# Patient Record
Sex: Female | Born: 1960
Health system: Southern US, Community
[De-identification: ages and names within clinical notes are randomized; demographics above are authoritative.]

## PROBLEM LIST (undated history)

## (undated) DIAGNOSIS — E119 Type 2 diabetes mellitus without complications: Secondary | ICD-10-CM

## (undated) DIAGNOSIS — J449 Chronic obstructive pulmonary disease, unspecified: Secondary | ICD-10-CM

## (undated) DIAGNOSIS — N200 Calculus of kidney: Secondary | ICD-10-CM

## (undated) HISTORY — PX: ABDOMINAL HYSTERECTOMY: SHX81

## (undated) HISTORY — PX: BLADDER SURGERY: SHX569

---

## 1998-08-27 ENCOUNTER — Other Ambulatory Visit: Admission: RE | Admit: 1998-08-27 | Discharge: 1998-08-27 | Payer: Self-pay | Admitting: Obstetrics and Gynecology

## 1999-09-29 ENCOUNTER — Other Ambulatory Visit: Admission: RE | Admit: 1999-09-29 | Discharge: 1999-09-29 | Payer: Self-pay | Admitting: Obstetrics & Gynecology

## 2001-02-19 ENCOUNTER — Emergency Department (HOSPITAL_COMMUNITY): Admission: EM | Admit: 2001-02-19 | Discharge: 2001-02-19 | Payer: Self-pay

## 2001-05-07 ENCOUNTER — Emergency Department (HOSPITAL_COMMUNITY): Admission: EM | Admit: 2001-05-07 | Discharge: 2001-05-07 | Payer: Self-pay

## 2001-08-28 ENCOUNTER — Emergency Department (HOSPITAL_COMMUNITY): Admission: EM | Admit: 2001-08-28 | Discharge: 2001-08-28 | Payer: Self-pay | Admitting: Emergency Medicine

## 2001-08-28 ENCOUNTER — Encounter: Payer: Self-pay | Admitting: Emergency Medicine

## 2001-09-21 ENCOUNTER — Other Ambulatory Visit: Admission: RE | Admit: 2001-09-21 | Discharge: 2001-09-21 | Payer: Self-pay | Admitting: Obstetrics and Gynecology

## 2002-07-21 ENCOUNTER — Emergency Department (HOSPITAL_COMMUNITY): Admission: EM | Admit: 2002-07-21 | Discharge: 2002-07-22 | Payer: Self-pay | Admitting: Emergency Medicine

## 2002-08-30 ENCOUNTER — Other Ambulatory Visit: Admission: RE | Admit: 2002-08-30 | Discharge: 2002-08-30 | Payer: Self-pay | Admitting: Obstetrics and Gynecology

## 2002-11-01 ENCOUNTER — Ambulatory Visit (HOSPITAL_COMMUNITY): Admission: RE | Admit: 2002-11-01 | Discharge: 2002-11-01 | Payer: Self-pay | Admitting: Family Medicine

## 2002-11-01 ENCOUNTER — Encounter: Payer: Self-pay | Admitting: Family Medicine

## 2003-05-22 ENCOUNTER — Encounter (INDEPENDENT_AMBULATORY_CARE_PROVIDER_SITE_OTHER): Payer: Self-pay | Admitting: *Deleted

## 2003-05-22 ENCOUNTER — Inpatient Hospital Stay (HOSPITAL_COMMUNITY): Admission: RE | Admit: 2003-05-22 | Discharge: 2003-05-25 | Payer: Self-pay | Admitting: Obstetrics and Gynecology

## 2003-06-01 ENCOUNTER — Inpatient Hospital Stay (HOSPITAL_COMMUNITY): Admission: AD | Admit: 2003-06-01 | Discharge: 2003-06-01 | Payer: Self-pay | Admitting: Obstetrics and Gynecology

## 2003-06-03 ENCOUNTER — Inpatient Hospital Stay (HOSPITAL_COMMUNITY): Admission: AD | Admit: 2003-06-03 | Discharge: 2003-06-03 | Payer: Self-pay | Admitting: Obstetrics and Gynecology

## 2003-06-08 ENCOUNTER — Inpatient Hospital Stay (HOSPITAL_COMMUNITY): Admission: AD | Admit: 2003-06-08 | Discharge: 2003-06-08 | Payer: Self-pay | Admitting: Obstetrics & Gynecology

## 2004-04-01 ENCOUNTER — Ambulatory Visit (HOSPITAL_COMMUNITY): Admission: RE | Admit: 2004-04-01 | Discharge: 2004-04-01 | Payer: Self-pay | Admitting: Family Medicine

## 2004-04-22 ENCOUNTER — Ambulatory Visit (HOSPITAL_COMMUNITY): Admission: RE | Admit: 2004-04-22 | Discharge: 2004-04-22 | Payer: Self-pay | Admitting: Family Medicine

## 2005-03-07 ENCOUNTER — Emergency Department (HOSPITAL_COMMUNITY): Admission: EM | Admit: 2005-03-07 | Discharge: 2005-03-08 | Payer: Self-pay | Admitting: Emergency Medicine

## 2005-03-12 ENCOUNTER — Ambulatory Visit (HOSPITAL_COMMUNITY): Admission: RE | Admit: 2005-03-12 | Discharge: 2005-03-12 | Payer: Self-pay | Admitting: Family Medicine

## 2005-04-02 ENCOUNTER — Encounter: Admission: RE | Admit: 2005-04-02 | Discharge: 2005-04-02 | Payer: Self-pay | Admitting: Gastroenterology

## 2005-11-16 ENCOUNTER — Emergency Department (HOSPITAL_COMMUNITY): Admission: EM | Admit: 2005-11-16 | Discharge: 2005-11-16 | Payer: Self-pay | Admitting: Emergency Medicine

## 2010-08-12 ENCOUNTER — Emergency Department (HOSPITAL_COMMUNITY): Admission: EM | Admit: 2010-08-12 | Discharge: 2010-08-12 | Payer: Self-pay | Admitting: Internal Medicine

## 2011-04-09 NOTE — Discharge Summary (Signed)
   NAME:  Jill Barnes, Jill Barnes                        ACCOUNT NO.:  [id number]   MEDICAL RECORD NO.:  [id number]                   PATIENT TYPE:  INP   LOCATION:  9308                                 FACILITY:  [facility]   PHYSICIAN:  Malva Limes, M.D.                 DATE OF BIRTH:  [birth year]   DATE OF ADMISSION:  05/22/2003  DATE OF DISCHARGE:  05/25/2003                                 DISCHARGE SUMMARY   PRINCIPAL DISCHARGE DIAGNOSES:  1. Chronic pelvic pain.  2. Menometorrhagia.  3. Symptomatic prolapse.  4. Stress urinary incontinence.   PRINCIPAL PROCEDURES:  1. Diagnostic laparoscopy.  2. Right ovarian cystectomy.  3. Total vaginal hysterectomy.  4. Placement of transvaginal tape.  5. Cystoscopy.  6. McCall's culdoplasty.  7. Anterior and posterior colporrhaphy.   HISTORY OF PRESENT ILLNESS:  [name] is a 50 year old white female, G2,  P2 who presented to [facility] on May 22, 2003 for the above listed  procedures secondary to a several-year history of worsening symptomatology.  A complete description of the events which led to this hospitalization can  be found in the dictated history and physical.   HOSPITAL COURSE:  The patient underwent the above listed procedures on May 22, 2003 without complications.  The patient's pathology was benign.  The  patient's preoperative hemoglobin was 15.7 and postoperative 13.6.  The  patient did have a postoperative temperature elevation on day number two;  white count was checked and found to be 17.2 thousand.  It was felt that  this was likely secondary to pulmonary etiology therefore patient was  instructed in aggressive incentive spirometry and told to discontinue her  smoking.  This temperature was isolated and the patient remained afebrile  throughout the remaining hospitalization.  The patient was discharged to  home on postoperative day number three after she had a bowel movement and  was tolerating a regular  diet.   The patient was discharged to home with a catheter and instructed to follow  up in the office in six days after removal and a voiding trial.  The patient  will follow up in the office of Dr. Edward Jolly in six days to evaluate her  bladder function.  At the time of discharge the patient's condition was  stable.                                                Malva Limes, M.D.    MA/MEDQ  D:  07/01/2003  T:  07/01/2003  Job:  [id number]

## 2011-04-09 NOTE — Op Note (Signed)
NAME:  Jill Barnes, Jill Barnes                        ACCOUNT NO.:  [id number]   MEDICAL RECORD NO.:  [id number]                   PATIENT TYPE:  INP   LOCATION:  9308                                 FACILITY:  [facility]   PHYSICIAN:  Malva Limes, M.D.                 DATE OF BIRTH:  Jan 22, 1961   DATE OF PROCEDURE:  05/22/2003  DATE OF DISCHARGE:                                 OPERATIVE REPORT   PREOPERATIVE DIAGNOSIS:  1. Chronic pelvic pain.  2. Menometrorrhagia.  3. Uterine prolapse.   POSTOPERATIVE DIAGNOSIS:  1. Chronic pelvic pain.  2. Menometrorrhagia.  3. Uterine prolapse.   OPERATION PERFORMED:  1. Diagnostic laparoscopy.  2. Right ovarian cystotomy.  3. Total vaginal hysterectomy.   SURGEON:  Malva Limes, M.D.   ASSISTANT:  Randye Lobo, M.D.   ANESTHESIA:  General endotracheal.   ANTIBIOTICS:  Ancef 1g.   ESTIMATED BLOOD LOSS:  .   COMPLICATIONS:  None.   DRAINS:  Foley catheter to bedside drainage.   SPECIMENS:  Cervix and uterus sent to pathology along with Hulka clips.   DESCRIPTION OF PROCEDURE:  The patient was taken to the operating room where  general anesthetic was administered without complications.  She was then  placed in a dorsal lithotomy position.  She was prepped with Hibiclens and  draped in the usual fashion for this procedure.  Her bladder was drained  with a red rubber catheter.  Sterile speculum was placed in the vagina.  A  Hulka tenaculum was applied to the anterior cervical lip.  A vertical skin  incision was made in the umbilicus.  The fascia was grasped, incised with  the scissors.  The parietal peritoneum was entered bluntly.  0 Vicryl  sutures were placed in the fascia and the Hasson cannula placed in the  abdominal cavity.  3L of carbon dioxide was insufflated.  The patient was  placed in Trendelenburg position.  A 5 mm port was placed in the suprapubic  region under direct visualization.  On examination, the patient had  no  evidence of endometriosis or adhesions.  The patient's pain was mostly on  the right and there appeared to be a peritoneal window; however, no obvious  endometriosis was discovered.  The right ovary appeared to be perfectly  normal.  The left ovary had a 3 cm cyst which on opening it, clear serous  fluid was noted.  There were no excrescences on this ovary and the ovary  appeared to be normal otherwise.  At this point laparoscopy was discontinued  and attention was turned to the vagina.  The cervix was injected with 1%  lidocaine with epinephrine.  The posterior cul-de-sac was entered sharply.  The uterosacral ligaments were bilaterally clamped, cut and ligated with 0  Monocryl suture.  The cervix was then circumscribed.  The bladder pillars  were bilaterally clamped, cut and ligated with 0 Monocryl suture.  The  anterior cul-de-sac was entered sharply.  Cardinal ligaments were serially  clamped, cut and ligated with 0 Monocryl suture.  The uterine vessels were  bilaterally clamped, cut and ligated with 0 Monocryl suture.  Once the level  of the round ligament was reached, the uterus was inverted and the round  ligament, fallopian tubes and ovarian ligaments were bilaterally clamped,  cut and the specimen removed.  These pedicles were doubly ligated with 0  Monocryl suture.  The Hulka clip on both sides from the previous tubal  ligation was included in this pedicle and excised after the 0 Vicryl sutures  were placed.  The pedicles were all checked and felt to be absolutely  hemostatic.  Following this, the posterior cuff was run using 0 Vicryl in a  running locking fashion.  At this point Dr. Edward Jolly took over the case where  she proceeded to do anterior and posterior colporrhaphy, culdoplasty,  placement of TVT sling and cystoscopy.  This will be dictated by her.  At  the conclusion of the procedure, the abdomen was reinsufflated and pedicles  all checked and found to be hemostatic.   Instruments removed,  pneumoperitoneum released.  The fascia and the umbilicus was closed with  interrupted 0 Vicryl suture.  The skin was closed with 4-0 Vicryl suture.  The lower incision was closed with DermaBond.  The patient tolerated the  procedure well.  She was taken to the recovery room in stable condition.  Instrument and lap counts were correct times two.                                                Malva Limes, M.D.    MA/MEDQ  D:  05/22/2003  T:  05/22/2003  Job:  [id number]

## 2011-04-09 NOTE — Op Note (Signed)
NAME:  Jill Barnes, Jill Barnes                        ACCOUNT NO.:  [id number]   MEDICAL RECORD NO.:  [id number]                   PATIENT TYPE:  INP   LOCATION:  9308                                 FACILITY:  [facility]   PHYSICIAN:  Randye Lobo, M.D.                DATE OF BIRTH:  1961-03-29   DATE OF PROCEDURE:  DATE OF DISCHARGE:                                 OPERATIVE REPORT   PREOPERATIVE DIAGNOSES:  1. Pelvic organ prolapse.  2. Genuine stress incontinence.   POSTOPERATIVE DIAGNOSES:  1. Pelvic organ prolapse.  2. Genuine stress incontinence.   OPERATION PERFORMED:  1. Total vaginal hysterectomy.  2. Cystoscopy.  3. McCall's culdoplasty.  4. Anterior and posterior colporrhaphy.   SURGEON:  Randye Lobo, M.D.   ASSISTANT:  Malva Limes, M.D.   ANESTHESIA:  General endotracheal.   TOTAL IV FLUIDS:  Ringer's lactate.   ESTIMATED BLOOD LOSS:  Total 350 mL.   URINE OUTPUT:  Total 250 mL.   COMPLICATIONS:  None.   INDICATIONS FOR PROCEDURE:  The patient is a 50 year old gravida 2, para 2,  Caucasian female, status post bilateral tubal ligation who presented for  evaluation of urinary incontinence of several years duration.  The patient  was referred by Dr. Malva Limes for evaluation of persistent leakage of  urine with coughing, sneezing and jumping in addition to urge incontinence.  This was a significant and persistent problem for the patient, and she  wished for surgical repair.  The patient was also scheduled for a diagnostic  laparoscopy and vaginal hysterectomy for evaluation of right lower quadrant  pelvic pain and for menometrorrhagia.  The patient desired a combined  procedure with Dr. Dareen Piano, and she agreed to proceed with her surgery for  prolapse and incontinence after the risks, benefits and alternatives were  discussed with her.   FINDINGS:  The patient was noted to have approximately second-degree uterine  prolapse with a second-degree  cystocele and a first-degree rectocele.  Cystoscopy performed at the end of the TVT and during the TVT procedure  documented absence of any foreign body in either the bladder or the urethra.  The bladder was visualized about 360 degrees and was noted to have a normal  dome and trigone.   DESCRIPTION OF PROCEDURE:  The patient was reidentified in the preop waiting  area.  The patient did have an IV, and she did receive preoperative  antibiotics per Dr. Ewell Poe orders.  The patient was brought to the  operating room and general endotracheal anesthesia was induced.  Dr.  Dareen Piano proceeded with a diagnostic laparoscopy with a left ovarian  cystotomy and a total vaginal hysterectomy.  Please refer to his dictation  separately.   Dr. Dareen Piano completed his portion of the procedure and put a running locked  suture in the posterior vaginal cuff and left the vagina open for me to  begin my portion  of the procedure.  All of the pedicle sites were previously  re-examined and found to be hemostatic.  I began the procedure with a  McCall's culdoplasty which was performed with a 3-0 Vicryl suture brought  through the posterior vagina into the cul-de-sac at the 6 o'clock position,  up to the uterosacral ligament on the patient's left hand side, across the  posterior cul-de-sac, down to the right uterosacral ligament, and then out  the vagina again at the 6 o'clock position.  This was held until the end of  the case at which time it was tied providing good elevation of the posterior  vaginal cuff.   The anterior vaginal wall  dissection proceeded at this time.  Allis clamps  were used to mark the midline of the vagina, and the submucosal tissue was  injected with 1% lidocaine with 1:200,000 of epinephrine.  The vaginal  mucosa was then incised in the midline in a vertical fashion up to  approximately 1.5 cm below the urethra.  The subvaginal tissue was dissected  sharply off of the overlying  vaginal mucosa bilaterally.  This was carried  out to the level of the pubic rami bilaterally.  A Foley catheter had been  placed inside the urinary bladder previously to begin the anterior vaginal  wall dissection.   The TVT was performed at this time.  Small puncture type incisions were made  suprapubically approximately two fingerbreadths above the pubic symphysis  and approximately 3 cm to the right and left of the midline.  The TVT  abdominal passer was then placed through the incision on the patient's right  hand side and out through the vaginal opening lateral to the bladder and at  the level of the mid urethra on the patient's right hand side.  The same  procedure was then performed on the patient's left hand side.  The Foley  catheter was removed at this time and cystoscopy was performed and the  findings are as noted above.  The mesh was attached at this time to the  abdominal passer, the mesh was brought out through the suprapubic incisions  bilaterally.  Again cystoscopy was performed and there was no evidence of  any foreign body in the bladder or urethra.  Both ureters were noted to be  patent bilaterally.  The cystoscope was removed and the bladder was drained.  The TVT tension was adjusted at this time and the plastic sheaths were  removed.  The anterior colporrhaphy was performed next.  There was some  bleeding noted near the site of the TVT exit location in the vagina on the  patient's left hand side.  Initially, a figure-of-eight suture of 2-0 Vicryl  was placed, but this did not create hemostasis.  A vertical mattress suture  with 2-0 Vicryl was therefore placed in this location, almost as per a Tresa Endo-  Kennedy plication.  This was tied and hemostasis was noted to be excellent  and the mesh continued to be in good placement.  The remainder of the  cystocele was reduced by placing vertical mattress sutures of 0 Vicryl. Excess vaginal mucosa was then trimmed and the  anterior vaginal wall was  closed with a running suture of 2-0 Vicryl.  This was continued down to the  level of the previously whip stitched cuff to close the vagina.   A posterior colporrhaphy was performed last.  Allis clamps were used to mark  the vestibule in the midline of the vagina.  The  submucosal tissue was then  injected with 1% lidocaine with 1:200,000 epinephrine.  A triangular wedge  of tissue was excised from the perineal body, and the posterior vaginal wall  was incised in the midline. The perirectal fascia was dissected off of the  overlying vaginal mucosa bilaterally.  Vertical mattress sutures of 0 Vicryl  were placed for reduction of the rectocele.  Excess vaginal mucosa was  trimmed bilaterally, and the posterior vaginal wall was closed with a  running locked suture of 2-0 Vicryl.  This was continued along the perineal  body, as for an episiotomy with subcuticular sutures.  The knot was tied  just below the hymenal ring.   The McCall culdoplasty suture was tied at this time.  There was excellent  support and elevation of the vaginal cuff.  An iodoform gauze packing was  placed in the vagina.  The suprapubic incisions including the suprapubic  incision from laparoscopy were all closed with DermaBond.  A Foley catheter  remained in the bladder to gravity drainage.  A rectal exam confirmed the  absence of sutures in the rectum.   This concluded the patient's procedure.  She was awakened and extubated and  brought to the recovery room in stable condition.  Sponge, needle, and  instrument counts were correct.                                                Randye Lobo, M.D.    BES/MEDQ  D:  05/22/2003  T:  05/22/2003  Job:  [id number]

## 2011-04-09 NOTE — H&P (Signed)
NAME:  Jill Barnes, Jill Barnes                        ACCOUNT NO.:  [id number]   MEDICAL RECORD NO.:  [id number]                   PATIENT TYPE:  INP   LOCATION:  NA                                   FACILITY:  [facility]   PHYSICIAN:  Randye Lobo, M.D.                DATE OF BIRTH:  01-Nov-1961   DATE OF ADMISSION:  05/22/2003  DATE OF DISCHARGE:                                HISTORY & PHYSICAL   CHIEF COMPLAINT:  Urinary incontinence.   HISTORY OF PRESENT ILLNESS:  The patient is a 50 year old gravida 2, para 2,  female, status post bilateral tubal ligation, who presented for evaluation  of urinary incontinence of several years duration.  The patient reports that  she has had persistent leakage of urine with coughing and sneezing, and  jumping on her trampoline.  The patient also reports with urge incontinence,  and she states that she leaks without warning.  The patient does not use a  pad unless is currently experiencing an illness causing her to cough or  sneeze frequently.  The patient denies a history of nephrolithiasis, urinary  tract infections, or pyelonephritis.  She denies a history of hematuria.  She has occasional dysuria and sometimes difficulty in emptying her bladder.  She denies a history of diabetes mellitus, back trauma, or neurologic  illness.   The patient is planning a laparoscopically assisted vaginal hysterectomy  with possible YAG laser with Dr. Malva Limes on May 22, 2003, for  menometorrhagia and right lower quadrant pain.  She has requested treatment  for her urinary incontinence at the same time.   PAST OBSTETRIC AND GYNECOLOGIC HISTORY:  Two prior vaginal deliveries.  Her  largest child weighed 7 pounds.  The patient's last Pap smear in October  2003, and was within normal limits.  Her last mammogram was in October 2003,  and was also within normal limits.  The patient has been seen and evaluated  by Dr. Dareen Piano for both menometorrhagia and for  dysmenorrhea.   PAST MEDICAL HISTORY:  The patient smokes one pack of cigarettes per day.   PAST SURGICAL HISTORY:  Status post bilateral tubal ligation.   MEDICATIONS:  None.   ALLERGIES:  Please refer to the patient's list.  She reportedly can take  Motrin without difficulty.   REVIEW OF SYSTEMS:  The patient denies any fecal incontinence.  She does  report some straining with bowel movements, and she does not have problems  with perineal splinting or using digital pressure for evacuation of stool.   PHYSICAL EXAMINATION:  VITAL SIGNS:  Blood pressure 120/68.  ABDOMEN:  The abdomen is soft and nontender without evidence of  hepatosplenomegaly or organomegaly.  PELVIC:  The patient has normal external genitalia.  The cervix and vagina  are without lesions.  There is a first degree cystocele, first degree  uterine prolapse, and a first degree rectocele.  The uterus is  noted to be  small and nontender.  There is tenderness in the right adnexa region without  evidence of any mass.  There is no left adnexal tenderness, no mass  appreciated.   LABORATORY DATA:  Multichannel urodynamic testing was performed on Apr 16, 2003, with a packing in the vagina.  The patient had a normal uroflow study.  The patient's CMG documented stability and no evidence of genuine stress  incontinence.  The patient had a normal pressure flow study.   IMPRESSION:  The patient is a 50 year old gravida 2, para 2, female, status  post bilateral tubal ligation, who is scheduled for surgical treatment of  her menometorrhagia and right lower quadrant pain with Dr. Malva Limes on  May 22, 2003, who also carries a diagnosis of potential genuine stress  incontinence.  The incontinence is a persistently social problem for the  patient, and she does chose to proceed with surgical treatment.   PLAN:  For the patient to have a diagnostic laparoscopy with YAG laser and  possible LABH with Dr. Malva Limes, followed  by a TVT procedure with an  anterior and posterior colporrhaphy and cystoscopy by me at [facility] as well.  Risks, benefits, and alternatives have been discussed  with the patient who wishes to proceed.                                                Randye Lobo, M.D.    BES/MEDQ  D:  05/21/2003  T:  05/21/2003  Job:  [id number]

## 2011-04-09 NOTE — Discharge Summary (Signed)
   NAME:  Jill Barnes, Jill Barnes                        ACCOUNT NO.:  [id number]   MEDICAL RECORD NO.:  [id number]                   PATIENT TYPE:  INP   LOCATION:  9308                                 FACILITY:  [facility]   PHYSICIAN:  Malva Limes, M.D.                 DATE OF BIRTH:  02-Aug-1961   DATE OF ADMISSION:  05/22/2003  DATE OF DISCHARGE:  05/25/2003                                 DISCHARGE SUMMARY   PRINCIPAL DISCHARGE DIAGNOSES:  1. Chronic pelvic pain.  2. Menometrorrhagia.  3. Symptomatic uterine prolapse.  4. Urinary stress incontinence.   PRINCIPAL PROCEDURES:  1. Diagnostic laparoscopy.  2. Right ovarian cystotomy.  3. Total vaginal hysterectomy.  4. Anterior/posterior colporrhaphy.  5. Placement of tension-free tape.  6. Cystoscopy.  7. McCall culdoplasty.   HISTORY OF PRESENT ILLNESS:  [name] is a 50 year old white female G2  P2 who presented to [facility] on May 22, 2003 for definitive therapy  in regard to her menometrorrhagia, chronic pelvic pain, and urinary stress  incontinence.  A complete description of the evaluation and events which led  up to this hospitalization can be found in the dictated History and  Physical.   HOSPITAL COURSE:  The patient underwent the above-named procedures on May 22, 2003 without complication.  Complete description of these procedures can  be found in the dictated operative note.  The patient's pathology returned  as all benign.  The patient's preoperative hemoglobin was 15.7;  postoperative 13.6.  The patient's postoperative course was complicated by  initial leukocytosis in the postoperative period.  The first white blood  count after surgery was 20.8.  The patient remained afebrile and no obvious  etiology for this white blood count elevation could be identified.  This was  followed up and at the time of discharge her white count was 12.5.  The  patient was tolerating a regular diet at the time of discharge.  The  patient  did have one temperature elevation on postoperative day #2 which was felt to  be secondary to atelectasis.  This resolved spontaneously.  She remained  afebrile over the remaining hospital course.  The patient was discharged to  home on postoperative day #3.  The patient was sent home with a catheter and  instructed to follow up in six days.  The patient was sent home with  Percocet and ibuprofen.  At the time of discharge the patient was eating a  regular diet and ambulating without difficulty.  Her condition was stable.                                               Malva Limes, M.D.    MA/MEDQ  D:  06/18/2003  T:  06/18/2003  Job:  [id number]

## 2011-04-09 NOTE — H&P (Signed)
NAME:  Jill Barnes, Jill Barnes NO.:  [id number]   MEDICAL RECORD NO.:  [id number]                   PATIENT TYPE:  INP   LOCATION:                                       [facility]ACILITY[facility]   PHYSICIAN:  Malva Limes, M.D.                 DATE OF BIRTH:  1960-12-03   DATE OF ADMISSION:  05/21/2003  DATE OF DISCHARGE:                                HISTORY & PHYSICAL   HISTORY OF PRESENT ILLNESS:  Jill Barnes is a 50 year old white female, G2,  P2, who presents for diagnostic laparoscopy followed by total vaginal  hysterectomy with placement of TVT tape and anterior and posterior  colporrhaphy.  This is secondary to a many year history of worsening mixed  incontinence and menorrhagia.  The patient also complains of a six-month  history of worsening right lower quadrant pain.  The pain worsens as she  approaches her menstrual cycle and becomes significantly more severe with  menses.  The patient has had thorough urodynamic evaluation by Randye Lobo, M.D.  Dr. Edward Jolly will performed placement of TVT tape with anterior  and posterior colporrhaphy.  The patient states that her menstrual cycles  have become increasingly more painful with irregularity and an extremely  heavy flow.   ALLERGIES:  The patient has allergies to:  1. MOTRIN.  2. DOLOBID.  3. ERYTHROMYCIN.  4. CIPRO.  5. CECLOR.  6. LORCET.   PAST SURGICAL HISTORY:  1. Two vaginal births.  2. Bilateral tubal ligation.   SOCIAL HISTORY:  The patient smokes one pack per day.  She denies alcohol or  drug use.   FAMILY HISTORY:  Significant for diabetes in her maternal grandmother.   PHYSICAL EXAMINATION:  GENERAL APPEARANCE:  A well-developed, well-  nourished, white female in no apparent distress.  HEENT:  Within normal limits.  LUNGS:  Clear to auscultation.  CARDIOVASCULAR:  Regular rate and rhythm without murmur.  BREASTS:  Without mass or tenderness.  There is no lymphadenopathy.  ABDOMEN:   Soft, nontender, and nondistended.  There is no organomegaly.  No  rebound.  No guarding.  EXTREMITIES:  Within normal limits.  PELVIC:  Normal external genitalia.  The patient has poor pelvic support  with a rectocele and cystocele.  She has first degree prolapse.  The cervix  is parous.  No cervical motion tenderness.  The uterus is normal size and  shape.  No adnexal  masses.   IMPRESSION:  1. Right lower quadrant pain, rule out endometriosis.  2. Menometrorrhagia.  3. Stress urinary incontinence.  4. Cystocele and rectocele.   PLAN:  Proceed with diagnostic laparoscopy followed by total vaginal  hysterectomy and placement of TVT tape.  This will be followed by anterior  and posterior colporrhaphy.  Malva Limes, M.D.    MA/MEDQ  D:  05/21/2003  T:  05/21/2003  Job:  [id number]

## 2012-05-19 ENCOUNTER — Encounter (HOSPITAL_COMMUNITY): Payer: Self-pay | Admitting: Family Medicine

## 2012-05-19 ENCOUNTER — Emergency Department (HOSPITAL_COMMUNITY)
Admission: EM | Admit: 2012-05-19 | Discharge: 2012-05-19 | Disposition: A | Discharge status: Home / Self Care | Payer: Self-pay | Attending: Emergency Medicine | Admitting: Emergency Medicine

## 2012-05-19 DIAGNOSIS — R7309 Other abnormal glucose: Secondary | ICD-10-CM | POA: Insufficient documentation

## 2012-05-19 DIAGNOSIS — R5383 Other fatigue: Secondary | ICD-10-CM | POA: Insufficient documentation

## 2012-05-19 DIAGNOSIS — R5381 Other malaise: Secondary | ICD-10-CM | POA: Insufficient documentation

## 2012-05-19 DIAGNOSIS — R631 Polydipsia: Secondary | ICD-10-CM

## 2012-05-19 DIAGNOSIS — F17200 Nicotine dependence, unspecified, uncomplicated: Secondary | ICD-10-CM | POA: Insufficient documentation

## 2012-05-19 LAB — URINALYSIS, ROUTINE W REFLEX MICROSCOPIC
Bilirubin Urine: NEGATIVE
Ketones, ur: NEGATIVE mg/dL
Leukocytes, UA: NEGATIVE
Nitrite: NEGATIVE
Protein, ur: NEGATIVE mg/dL
Specific Gravity, Urine: 1.031 — ABNORMAL HIGH (ref 1.005–1.030)
Urobilinogen, UA: 1 mg/dL (ref 0.0–1.0)
pH: 5.5 (ref 5.0–8.0)

## 2012-05-19 LAB — POCT I-STAT, CHEM 8
BUN: 11 mg/dL (ref 6–23)
Calcium, Ion: 1.16 mmol/L (ref 1.12–1.32)
Chloride: 106 mEq/L (ref 96–112)
Creatinine, Ser: 0.7 mg/dL (ref 0.50–1.10)
Glucose, Bld: 109 mg/dL — ABNORMAL HIGH (ref 70–99)
HCT: 49 % — ABNORMAL HIGH (ref 36.0–46.0)
Hemoglobin: 16.7 g/dL — ABNORMAL HIGH (ref 12.0–15.0)
Potassium: 4.1 mEq/L (ref 3.5–5.1)
Sodium: 141 mEq/L (ref 135–145)
TCO2: 23 mmol/L (ref 0–100)

## 2012-05-19 LAB — GLUCOSE, CAPILLARY: Glucose-Capillary: 114 mg/dL — ABNORMAL HIGH (ref 70–99)

## 2012-05-19 NOTE — Discharge Instructions (Signed)
Hyperglycemia Hyperglycemia occurs when the glucose (sugar) in your blood is too high. Hyperglycemia can happen for many reasons, but it most often happens to people who do not know they have diabetes or are not managing their diabetes properly.  CAUSES  Whether you have diabetes or not, there are other causes of hyperglycemia. Hyperglycemia can occur when you have diabetes, but it can also occur in other situations that you might not be as aware of, such as: Diabetes  If you have diabetes and are having problems controlling your blood glucose, hyperglycemia could occur because of some of the following reasons:   Not following your meal plan.   Not taking your diabetes medications or not taking it properly.   Exercising less or doing less activity than you normally do.   Being sick.  Pre-diabetes  This cannot be ignored. Before people develop Type 2 diabetes, they almost always have "pre-diabetes." This is when your blood glucose levels are higher than normal, but not yet high enough to be diagnosed as diabetes. Research has shown that some long-term damage to the body, especially the heart and circulatory system, may already be occurring during pre-diabetes. If you take action to manage your blood glucose when you have pre-diabetes, you may delay or prevent Type 2 diabetes from developing.  Stress  If you have diabetes, you may be "diet" controlled or on oral medications or insulin to control your diabetes. However, you may find that your blood glucose is higher than usual in the hospital whether you have diabetes or not. This is often referred to as "stress hyperglycemia." Stress can elevate your blood glucose. This happens because of hormones put out by the body during times of stress. If stress has been the cause of your high blood glucose, it can be followed regularly by your caregiver. That way he/she can make sure your hyperglycemia does not continue to get worse or progress to diabetes.   Steroids  Steroids are medications that act on the infection fighting system (immune system) to block inflammation or infection. One side effect can be a rise in blood glucose. Most people can produce enough extra insulin to allow for this rise, but for those who cannot, steroids make blood glucose levels go even higher. It is not unusual for steroid treatments to "uncover" diabetes that is developing. It is not always possible to determine if the hyperglycemia will go away after the steroids are stopped. A special blood test called an A1c is sometimes done to determine if your blood glucose was elevated before the steroids were started.  SYMPTOMS  Thirsty.   Frequent urination.   Dry mouth.   Blurred vision.   Tired or fatigue.   Weakness.   Sleepy.   Tingling in feet or leg.  DIAGNOSIS  Diagnosis is made by monitoring blood glucose in one or all of the following ways:  A1c test. This is a chemical found in your blood.   Fingerstick blood glucose monitoring.   Laboratory results.  TREATMENT  First, knowing the cause of the hyperglycemia is important before the hyperglycemia can be treated. Treatment may include, but is not be limited to:  Education.   Change or adjustment in medications.   Change or adjustment in meal plan.   Treatment for an illness, infection, etc.   More frequent blood glucose monitoring.   Change in exercise plan.   Decreasing or stopping steroids.   Lifestyle changes.  HOME CARE INSTRUCTIONS   Test your blood glucose as   directed.   Exercise regularly. Your caregiver will give you instructions about exercise. Pre-diabetes or diabetes which comes on with stress is helped by exercising.   Eat wholesome, balanced meals. Eat often and at regular, fixed times. Your caregiver or nutritionist will give you a meal plan to guide your sugar intake.   Being at an ideal weight is important. If needed, losing as little as 10 to 15 pounds may help  improve blood glucose levels.  SEEK MEDICAL CARE IF:   You have questions about medicine, activity, or diet.   You continue to have symptoms (problems such as increased thirst, urination, or weight gain).  SEEK IMMEDIATE MEDICAL CARE IF:   You are vomiting or have diarrhea.   Your breath smells fruity.   You are breathing faster or slower.   You are very sleepy or incoherent.   You have numbness, tingling, or pain in your feet or hands.   You have chest pain.   Your symptoms get worse even though you have been following your caregiver's orders.   If you have any other questions or concerns.  Document Released: 05/04/2001 Document Revised: 10/28/2011 Document Reviewed: 06/30/2009 ExitCare Patient Information 2012 [employer].RESOURCE GUIDE  Chronic Pain Problems: Contact [facility]  [phone] Patients need to be referred by their primary care doctor.  Insufficient Money for Medicine: Contact United Way:  call "211" or [facility] [phone].  No Primary Care Doctor: - Call [facility]  [phone] - can help you locate a primary care doctor that  accepts your insurance, provides certain services, etc. - Physician Referral Service- 1-800-533-3463  Agencies that provide inexpensive medical care: - [facility]  [phone] - [facility]  [phone] - [facility]  [phone] - [facility]  [phone] - [facility]  [phone] - [facility]  [phone]  Medicaid-accepting [place] Providers: - [facility]- [address]  [phone], Mon-Fri 9am-7pm, Sat 9am-1pm - [facility]- [address], [address]  [phone] - [facility]- [address], [address]  [phone] - [facility]- [address] [place] [address]  [phone] - Veita Bland- [address], [address], [phone]  Only accepts  [place] Access Medicaid patients after they have their name  applied to their card  Self Pay (no insurance) in [place]: - Sickle Cell Patients: Dr Eric Dean, [facility]  [address], [phone] - [facility]- [address]  [phone]       -     [facility]- [address]       -     [facility]- see information above (Speak to Pam H if you do not have insurance)       -  [facility]- [address], [phone]       -  [facility] [place]- [address],  [phone]       -  [facility]- [address] [place] [address], [phone]       -  Dr Osei-Bonsu-  [address], [place], [phone]       -  [facility]- [address], [phone]       -  [facility]- [address] [place] [address], [phone], also [address], [phone]       -    [facility]- [address], [phone], 1st & 3rd Saturday   every month, 10am-1pm  1) Find a Doctor and Pay Out of Pocket Although you won't have to find out who is covered by your insurance plan, it is a good idea to ask around and get recommendations. You will then need to call the office and see if the doctor you have chosen will accept you as a new patient and what types of options they offer for patients who are self-pay. Some doctors offer discounts or will set up payment plans for their patients who do not have insurance, but you will need to ask so you aren't surprised when you get to your appointment.  2) Contact Your Local Health Department Not all health departments have doctors that can see patients for sick visits, but many do, so it is worth a call to see if yours does. If you don't know where your local health department is, you can check in your phone book. The CDC also has a tool to help you locate your state's health department, and many state websites also have listings of all of their  local health departments.  3) Find a Walk-in Clinic If your illness is not likely to be very severe or complicated, you may want to try a walk in clinic. These are popping up all over the country in pharmacies, drugstores, and shopping centers. They're usually staffed by nurse practitioners or physician assistants that have been trained to treat common illnesses and complaints. They're usually fairly quick and inexpensive. However, if you have serious medical issues or chronic medical problems, these are probably not your best option  STD Testing - [place] [facility] [place], [facility], [address], [place], phone [phone] or [phone].  Monday - Friday, call for an appointment. - [place] [facility] [place], [facility], [address], [place], phone [phone] or 1-877-539-9860.  Monday - Friday, call for an appointment.  Abuse/Neglect: - [place] Child Abuse Hotline (336) 641-3795 - [place] Child Abuse Hotline 800-378-5315 (After Hours)  Emergency Shelter:   [facility] (336) 271-5985  Maternity Homes: - [facility] (336) 275-9566 - [facility] (704) 372-4663  MRSA Hotline #:   832-7006  [place] Resources  [facility] [place]  [employer] [place] [facility] [address]                 [address]         [address]  [place]                                               [place]                              [place] Phone:  [phone]                                  Phone:  [phone]                   Phone:    [phone]  [place] [facility], [phone] - [place] Services - [facility]- 1-888-581-9988       -     [facility] in [place], [address],                                  336-349-4454, Insurance  [place] Child Abuse  Hotline (336) 342-1394 or (336) 342-3537 (After Hours)   Behavioral Health Services  Substance Abuse Resources: - [facility]  336-882-2125 - [facility] 336-784-9470 - [facility] 336-285-9073 - [facility] 336-845-3988 - Residential & Outpatient Substance Abuse Program  800-659-3381  Psychological Services: - [facility]  [phone] - [facility]  [phone] - [place] [facility], [address], [place], ACCESS LINE: 1-800-853-5163 or 336-641-4981, Http://www.[facility].com/services/adult.htm  Dental Assistance  If unable to pay or uninsured, contact:  [facility] or [place] [facility] to become qualified for the adult dental clinic.  Patients with Medicaid: [facility] [address], [phone], [phone]  If unable to pay, or uninsured, contact [facility] ([phone]) or [place] [facility] ([phone] in [place], [phone] in [place]) to become qualified for the adult dental clinic  Other Low-Cost Community Dental Services: - [facility]- [address], [place], [place], [address], [phone], [address]. [address], 2nd and 4th Thursday of the month at 6:30am.  10 clients each day by appointment, can sometimes see walk-in patients if someone does not show for an appointment. - [facility]- [address], [place], [place], [address], [phone] - [facility]- [address], [place], [place], [address], [phone] - [place] [facility]- [phone] - [facility]- [phone] - [facility]- [phone]  

## 2012-05-19 NOTE — ED Notes (Signed)
Pt reports not feeling well over last week. Began checking her blood sugars more often. Sts has been 150-280 all week. Reports fatigue, polydipsia, HA, diaphoresis. No change in urination. Sts she was told by her family MD that she used to see that she was borderline diabetic approx 5-29yrs ago.

## 2012-05-19 NOTE — ED Notes (Signed)
Pt reports she was considered border-line diabetic a few years ago and states she checks her blood sugar occasionally. Reports over the past few days it has been running between 180s to over 200.  Reports fatigue and excessive thirst.

## 2012-05-19 NOTE — ED Provider Notes (Signed)
History     CSN: [id number]  Arrival date & time 05/19/12  1343   First MD Initiated Contact with Patient 05/19/12 1541      Chief Complaint  Patient presents with  . Fatigue  . Polydipsia  . Hyperglycemia    (Consider location/radiation/quality/duration/timing/severity/associated sxs/prior treatment) HPI  51 year old female presents with concerns for diabetes. Patient states she was told she has borderline diabetes a few years ago. For the past week she has had increased fatigue, increased thirst and having burning and tingling sensation to both feet. Patient reports checking her blood sugar recently after meal and is usually runs between 180s to 200s. Patient denies fever, headache, chest pain or shortness of breath, abdominal pain, back pain, urinary symptoms, or rash. She admits to being a smoker and smoke one pack a day.  History reviewed. No pertinent past medical history.  Past Surgical History  Procedure Date  . Bladder surgery   . Abdominal hysterectomy     History reviewed. No pertinent family history.  History  Substance Use Topics  . Smoking status: Current Everyday Smoker -- 1.0 packs/day  . Smokeless tobacco: Not on file  . Alcohol Use: No    OB History    Grav Para Term Preterm Abortions TAB SAB Ect Mult Living                  Review of Systems  All other systems reviewed and are negative.    Allergies  Sulfa antibiotics  Home Medications   Current Outpatient Rx  Name Route Sig Dispense Refill  . OVER THE COUNTER MEDICATION Oral Take 1 tablet by mouth daily as needed. OTC allergy medicine.      BP 157/81  Pulse 86  Temp 98 F (36.7 C) (Oral)  Resp 18  SpO2 96%  Physical Exam  Nursing note and vitals reviewed. Constitutional: She is oriented to person, place, and time. She appears well-developed and well-nourished. No distress.       Awake, alert, nontoxic appearance  HENT:  Head: Atraumatic.  Mouth/Throat: Oropharynx is clear and  moist. No oropharyngeal exudate.  Eyes: Conjunctivae and EOM are normal. Pupils are equal, round, and reactive to light. Right eye exhibits no discharge. Left eye exhibits no discharge.  Neck: Neck supple.  Cardiovascular: Normal rate and regular rhythm.   Pulmonary/Chest: Effort normal. No respiratory distress. She exhibits no tenderness.  Abdominal: Soft. There is no tenderness. There is no rebound.  Musculoskeletal: She exhibits no tenderness.       ROM appears intact, no obvious focal weakness  Neurological: She is alert and oriented to person, place, and time. She has normal strength. No cranial nerve deficit or sensory deficit. She displays a negative Romberg sign. Coordination and gait normal. GCS eye subscore is 4. GCS verbal subscore is 5. GCS motor subscore is 6.       Mental status and motor strength appears intact  Skin: No rash noted.  Psychiatric: She has a normal mood and affect.    ED Course  Procedures (including critical care time)  Labs Reviewed  GLUCOSE, CAPILLARY - Abnormal; Notable for the following:    Glucose-Capillary 114 (*)     All other components within normal limits   No results found.   No diagnosis found.  Results for orders placed during the hospital encounter of 05/19/12  GLUCOSE, CAPILLARY      Component Value Range   Glucose-Capillary 114 (*) 70 - 99 mg/dL  URINALYSIS, ROUTINE W REFLEX  MICROSCOPIC      Component Value Range   Color, Urine YELLOW  YELLOW   APPearance CLOUDY (*) CLEAR   Specific Gravity, Urine 1.031 (*) 1.005 - 1.030   pH 5.5  5.0 - 8.0   Glucose, UA NEGATIVE  NEGATIVE mg/dL   Hgb urine dipstick NEGATIVE  NEGATIVE   Bilirubin Urine NEGATIVE  NEGATIVE   Ketones, ur NEGATIVE  NEGATIVE mg/dL   Protein, ur NEGATIVE  NEGATIVE mg/dL   Urobilinogen, UA 1.0  0.0 - 1.0 mg/dL   Nitrite NEGATIVE  NEGATIVE   Leukocytes, UA NEGATIVE  NEGATIVE  POCT I-STAT, CHEM 8      Component Value Range   Sodium 141  135 - 145 mEq/L   Potassium  4.1  3.5 - 5.1 mEq/L   Chloride 106  96 - 112 mEq/L   BUN 11  6 - 23 mg/dL   Creatinine, Ser 4.09  0.50 - 1.10 mg/dL   Glucose, Bld 811 (*) 70 - 99 mg/dL   Calcium, Ion 9.14  7.82 - 1.32 mmol/L   TCO2 23  0 - 100 mmol/L   Hemoglobin 16.7 (*) 12.0 - 15.0 g/dL   HCT 95.6 (*) 21.3 - 08.6 %   No results found.    MDM  Patient has symptoms concerning for diabetes. Her initial CBG is 114.  5:19 PM Normal CBG on istat, normal H&H, normal electrolytes.  Plan to have pt f/u with PCP for further evaluation.  Her VSS, afebrile, no source of infection.  Recommend smoking cessation, diet and exercise.  Pt voice understanding and agrees with plan.        Fayrene Helper, PA-C 05/19/12 1720

## 2012-05-19 NOTE — ED Notes (Signed)
Pt reported to NT that she checked her CBG with her meter and it read 180.

## 2012-05-25 NOTE — ED Provider Notes (Signed)
Medical screening examination/treatment/procedure(s) were performed by non-physician practitioner and as supervising physician I was immediately available for consultation/collaboration.  Raeford Razor, MD 05/25/12 2325

## 2014-05-11 ENCOUNTER — Emergency Department (HOSPITAL_BASED_OUTPATIENT_CLINIC_OR_DEPARTMENT_OTHER)
Admission: EM | Admit: 2014-05-11 | Discharge: 2014-05-11 | Disposition: A | Discharge status: Home / Self Care | Payer: 59 | Attending: Emergency Medicine | Admitting: Emergency Medicine

## 2014-05-11 ENCOUNTER — Encounter (HOSPITAL_BASED_OUTPATIENT_CLINIC_OR_DEPARTMENT_OTHER): Payer: Self-pay | Admitting: Emergency Medicine

## 2014-05-11 ENCOUNTER — Emergency Department (HOSPITAL_BASED_OUTPATIENT_CLINIC_OR_DEPARTMENT_OTHER): Payer: 59

## 2014-05-11 DIAGNOSIS — N83202 Unspecified ovarian cyst, left side: Secondary | ICD-10-CM

## 2014-05-11 DIAGNOSIS — F17200 Nicotine dependence, unspecified, uncomplicated: Secondary | ICD-10-CM | POA: Insufficient documentation

## 2014-05-11 DIAGNOSIS — N83209 Unspecified ovarian cyst, unspecified side: Secondary | ICD-10-CM | POA: Insufficient documentation

## 2014-05-11 DIAGNOSIS — Z90710 Acquired absence of both cervix and uterus: Secondary | ICD-10-CM | POA: Insufficient documentation

## 2014-05-11 DIAGNOSIS — N200 Calculus of kidney: Secondary | ICD-10-CM | POA: Insufficient documentation

## 2014-05-11 HISTORY — DX: Calculus of kidney: N20.0

## 2014-05-11 LAB — URINALYSIS, ROUTINE W REFLEX MICROSCOPIC
Bilirubin Urine: NEGATIVE
Glucose, UA: >1000 mg/dL — AB
KETONES UR: 15 mg/dL — AB
LEUKOCYTES UA: NEGATIVE
NITRITE: NEGATIVE
PROTEIN: NEGATIVE mg/dL
Specific Gravity, Urine: 1.02 (ref 1.005–1.030)
UROBILINOGEN UA: 1 mg/dL (ref 0.0–1.0)
pH: 5.5 (ref 5.0–8.0)

## 2014-05-11 LAB — CBG MONITORING, ED: GLUCOSE-CAPILLARY: 248 mg/dL — AB (ref 70–99)

## 2014-05-11 LAB — URINE MICROSCOPIC-ADD ON

## 2014-05-11 MED ORDER — ONDANSETRON HCL 4 MG/2ML IJ SOLN
4.0000 mg | Freq: Once | INTRAMUSCULAR | Status: AC
  Administered 2014-05-11: 4 mg via INTRAVENOUS
  Filled 2014-05-11: qty 2

## 2014-05-11 MED ORDER — TAMSULOSIN HCL 0.4 MG PO CAPS: 0.4000 mg | ORAL_CAPSULE | Freq: Every day | ORAL | Status: DC

## 2014-05-11 MED ORDER — METFORMIN HCL 1000 MG PO TABS: 500.0000 mg | ORAL_TABLET | Freq: Two times a day (BID) | ORAL | Status: DC

## 2014-05-11 MED ORDER — OXYCODONE-ACETAMINOPHEN 5-325 MG PO TABS: 2.0000 | ORAL_TABLET | ORAL | Status: DC | PRN

## 2014-05-11 MED ORDER — HYDROMORPHONE HCL PF 1 MG/ML IJ SOLN
1.0000 mg | Freq: Once | INTRAMUSCULAR | Status: AC
  Administered 2014-05-11: 1 mg via INTRAVENOUS
  Filled 2014-05-11: qty 1

## 2014-05-11 NOTE — ED Notes (Signed)
Pt with acute onset of L flank pain with radiation to groin area with difficulty voiding.  Nausea.  Hx of kidney stone previously.

## 2014-05-11 NOTE — ED Provider Notes (Signed)
CSN: [id number]     Arrival date & time 05/11/14  1311 History   None    Chief Complaint  Patient presents with  . Flank Pain     (Consider location/radiation/quality/duration/timing/severity/associated sxs/prior Treatment) Patient is a 53 y.o. female presenting with back pain. The history is provided by the patient. No language interpreter was used.  Back Pain Location:  Generalized Quality:  Aching Radiates to:  Does not radiate Pain severity:  Severe Pain is:  Same all the time Onset quality:  Sudden Timing:  Constant Progression:  Worsening Chronicity:  New Relieved by:  Nothing Worsened by:  Nothing tried Associated symptoms: abdominal pain   Risk factors: not pregnant    patient reports she's had kidney stones in the past current pain feels like previous kidney stones. Patient reports difficulty urinating today. Patient feels like she has to urinate but nothing comes out.  Past Medical History  Diagnosis Date  . Kidney stone    Past Surgical History  Procedure Laterality Date  . Bladder surgery    . Abdominal hysterectomy     No family history on file. History  Substance Use Topics  . Smoking status: Current Every Day Smoker -- 1.00 packs/day  . Smokeless tobacco: Not on file  . Alcohol Use: No   OB History   Grav Para Term Preterm Abortions TAB SAB Ect Mult Living                 Review of Systems  Gastrointestinal: Positive for abdominal pain.  Musculoskeletal: Positive for back pain.  All other systems reviewed and are negative.     Allergies  Sulfa antibiotics  Home Medications   Prior to Admission medications   Medication Sig Start Date End Date Taking? Authorizing Provider  OVER THE COUNTER MEDICATION Take 1 tablet by mouth daily as needed. OTC allergy medicine.    Historical Provider, MD   BP 206/99  Pulse 83  Temp(Src) 97.7 F (36.5 C)  Resp 21  Ht 5\' 7"  (1.702 m)  Wt 180 lb (81.647 kg)  BMI 28.19 kg/m2  SpO2 99% Physical Exam   Nursing note and vitals reviewed. Constitutional: She is oriented to person, place, and time. She appears well-developed and well-nourished.  HENT:  Head: Normocephalic.  Eyes: EOM are normal.  Neck: Normal range of motion.  Cardiovascular: Normal rate.   Pulmonary/Chest: Effort normal.  Abdominal: Soft. She exhibits no distension.  Musculoskeletal: Normal range of motion.  Neurological: She is alert and oriented to person, place, and time.  Psychiatric: She has a normal mood and affect.    ED Course  Procedures (including critical care time) Labs Review Labs Reviewed - No data to display  Imaging Review No results found.   EKG Interpretation None      MDM   Final diagnoses:  Kidney stone  Cyst of left ovary    Urine shows large hemoglobin and blood cells too numerous to count bacteria many. CT scan shows 1.5 mm left UVJ calculus causing fullness of left intrarenal collection system per radiologist. There is also an 8 cm left ovarian cyst.  Patient is counseled on kidney stone she is given a strainer a prescription for Percocet and flow max patient is advised of an ovarian cyst. She is advised to followup with [facility] Dr. Jacquelyne BalintMcDermott. She is advised to make her primary care doctor aware of an ovarian cyst said that they may follow to resolution. Patient is advised to return to the emergency department  if symptoms become worse or change  Patient also has greater than 1000 glucose in her urine. I obtained a CBG which is 248. Patient reports she has been monitoring her glucose is not currently on any medicines but has been having milligrams up to 300 patient has plans to followup with primary care physician. I will start her on metformin 500 mg twice a day  Elson AreasLeslie K Lynsay Fesperman, PA-C 05/11/14 1646  Lonia SkinnerLeslie K GouldingSofia, [place] JerseyPA-C 05/11/14 1655

## 2014-05-11 NOTE — ED Notes (Signed)
PA at bedside.

## 2014-05-11 NOTE — Discharge Instructions (Signed)
Kidney Stones °Kidney stones (urolithiasis) are deposits that form inside your kidneys. The intense pain is caused by the stone moving through the urinary tract. When the stone moves, the ureter goes into spasm around the stone. The stone is usually passed in the urine.  °CAUSES  °· A disorder that makes certain neck glands produce too much parathyroid hormone (primary hyperparathyroidism). °· A buildup of uric acid crystals, similar to gout in your joints. °· Narrowing (stricture) of the ureter. °· A kidney obstruction present at birth (congenital obstruction). °· Previous surgery on the kidney or ureters. °· Numerous kidney infections. °SYMPTOMS  °· Feeling sick to your stomach (nauseous). °· Throwing up (vomiting). °· Blood in the urine (hematuria). °· Pain that usually spreads (radiates) to the groin. °· Frequency or urgency of urination. °DIAGNOSIS  °· Taking a history and physical exam. °· Blood or urine tests. °· CT scan. °· Occasionally, an examination of the inside of the urinary bladder (cystoscopy) is performed. °TREATMENT  °· Observation. °· Increasing your fluid intake. °· Extracorporeal shock wave lithotripsy--This is a noninvasive procedure that uses shock waves to break up kidney stones. °· Surgery may be needed if you have severe pain or persistent obstruction. There are various surgical procedures. Most of the procedures are performed with the use of small instruments. Only small incisions are needed to accommodate these instruments, so recovery time is minimized. °The size, location, and chemical composition are all important variables that will determine the proper choice of action for you. Talk to your health care provider to better understand your situation so that you will minimize the risk of injury to yourself and your kidney.  °HOME CARE INSTRUCTIONS  °· Drink enough water and fluids to keep your urine clear or pale yellow. This will help you to pass the stone or stone fragments. °· Strain  all urine through the provided strainer. Keep all particulate matter and stones for your health care provider to see. The stone causing the pain may be as small as a grain of salt. It is very important to use the strainer each and every time you pass your urine. The collection of your stone will allow your health care provider to analyze it and verify that a stone has actually passed. The stone analysis will often identify what you can do to reduce the incidence of recurrences. °· Only take over-the-counter or prescription medicines for pain, discomfort, or fever as directed by your health care provider. °· Make a follow-up appointment with your health care provider as directed. °· Get follow-up X-rays if required. The absence of pain does not always mean that the stone has passed. It may have only stopped moving. If the urine remains completely obstructed, it can cause loss of kidney function or even complete destruction of the kidney. It is your responsibility to make sure X-rays and follow-ups are completed. Ultrasounds of the kidney can show blockages and the status of the kidney. Ultrasounds are not associated with any radiation and can be performed easily in a matter of minutes. °SEEK MEDICAL CARE IF: °· You experience pain that is progressive and unresponsive to any pain medicine you have been prescribed. °SEEK IMMEDIATE MEDICAL CARE IF:  °· Pain cannot be controlled with the prescribed medicine. °· You have a fever or shaking chills. °· The severity or intensity of pain increases over 18 hours and is not relieved by pain medicine. °· You develop a new onset of abdominal pain. °· You feel faint or pass out. °·   You are unable to urinate. MAKE SURE YOU:   Understand these instructions.  Will watch your condition.  Will get help right away if you are not doing well or get worse. Document Released: 11/08/2005 Document Revised: 07/11/2013 Document Reviewed: 04/11/2013 [facility] Patient Information 2015  [place], [place]. This information is not intended to replace advice given to you by your health care provider. Make sure you discuss any questions you have with your health care provider. Ovarian Cyst An ovarian cyst is a fluid-filled sac that forms on an ovary. The ovaries are small organs that produce eggs in women. Various types of cysts can form on the ovaries. Most are not cancerous. Many do not cause problems, and they often go away on their own. Some may cause symptoms and require treatment. Common types of ovarian cysts include:  Functional cysts--These cysts may occur every month during the menstrual cycle. This is normal. The cysts usually go away with the next menstrual cycle if the woman does not get pregnant. Usually, there are no symptoms with a functional cyst.  Endometrioma cysts--These cysts form from the tissue that lines the uterus. They are also called "chocolate cysts" because they become filled with blood that turns brown. This type of cyst can cause pain in the lower abdomen during intercourse and with your menstrual period.  Cystadenoma cysts--This type develops from the cells on the outside of the ovary. These cysts can get very big and cause lower abdomen pain and pain with intercourse. This type of cyst can twist on itself, cut off its blood supply, and cause severe pain. It can also easily rupture and cause a lot of pain.  Dermoid cysts--This type of cyst is sometimes found in both ovaries. These cysts may contain different kinds of body tissue, such as skin, teeth, hair, or cartilage. They usually do not cause symptoms unless they get very big.  Theca lutein cysts--These cysts occur when too much of a certain hormone (human chorionic gonadotropin) is produced and overstimulates the ovaries to produce an egg. This is most common after procedures used to assist with the conception of a baby (in vitro fertilization). CAUSES   Fertility drugs can cause a condition in which  multiple large cysts are formed on the ovaries. This is called ovarian hyperstimulation syndrome.  A condition called polycystic ovary syndrome can cause hormonal imbalances that can lead to nonfunctional ovarian cysts. SIGNS AND SYMPTOMS  Many ovarian cysts do not cause symptoms. If symptoms are present, they may include:  Pelvic pain or pressure.  Pain in the lower abdomen.  Pain during sexual intercourse.  Increasing girth (swelling) of the abdomen.  Abnormal menstrual periods.  Increasing pain with menstrual periods.  Stopping having menstrual periods without being pregnant. DIAGNOSIS  These cysts are commonly found during a routine or annual pelvic exam. Tests may be ordered to find out more about the cyst. These tests may include:  Ultrasound.  X-ray of the pelvis.  CT scan.  MRI.  Blood tests. TREATMENT  Many ovarian cysts go away on their own without treatment. Your health care provider may want to check your cyst regularly for 2-3 months to see if it changes. For women in menopause, it is particularly important to monitor a cyst closely because of the higher rate of ovarian cancer in menopausal women. When treatment is needed, it may include any of the following:  A procedure to drain the cyst (aspiration). This may be done using a long needle and ultrasound. It can  also be done through a laparoscopic procedure. This involves using a thin, lighted tube with a tiny camera on the end (laparoscope) inserted through a small incision.  Surgery to remove the whole cyst. This may be done using laparoscopic surgery or an open surgery involving a larger incision in the lower abdomen.  Hormone treatment or birth control pills. These methods are sometimes used to help dissolve a cyst. HOME CARE INSTRUCTIONS   Only take over-the-counter or prescription medicines as directed by your health care provider.  Follow up with your health care provider as directed.  Get regular  pelvic exams and Pap tests. SEEK MEDICAL CARE IF:   Your periods are late, irregular, or painful, or they stop.  Your pelvic pain or abdominal pain does not go away.  Your abdomen becomes larger or swollen.  You have pressure on your bladder or trouble emptying your bladder completely.  You have pain during sexual intercourse.  You have feelings of fullness, pressure, or discomfort in your stomach.  You lose weight for no apparent reason.  You feel generally ill.  You become constipated.  You lose your appetite.  You develop acne.  You have an increase in body and facial hair.  You are gaining weight, without changing your exercise and eating habits.  You think you are pregnant. SEEK IMMEDIATE MEDICAL CARE IF:   You have increasing abdominal pain.  You feel sick to your stomach (nauseous), and you throw up (vomit).  You develop a fever that comes on suddenly.  You have abdominal pain during a bowel movement.  Your menstrual periods become heavier than usual. MAKE SURE YOU:  Understand these instructions.  Will watch your condition.  Will get help right away if you are not doing well or get worse. Document Released: 11/08/2005 Document Revised: 11/13/2013 Document Reviewed: 07/16/2013 [facility] Patient Information 2015 [place], [place]LL[place]. This information is not intended to replace advice given to you by your health care provider. Make sure you discuss any questions you have with your health care provider. Hyperglycemia Hyperglycemia occurs when the glucose (sugar) in your blood is too high. Hyperglycemia can happen for many reasons, but it most often happens to people who do not know they have diabetes or are not managing their diabetes properly.  CAUSES  Whether you have diabetes or not, there are other causes of hyperglycemia. Hyperglycemia can occur when you have diabetes, but it can also occur in other situations that you might not be as aware of, such  as: Diabetes  If you have diabetes and are having problems controlling your blood glucose, hyperglycemia could occur because of some of the following reasons:  Not following your meal plan.  Not taking your diabetes medications or not taking it properly.  Exercising less or doing less activity than you normally do.  Being sick. Pre-diabetes  This cannot be ignored. Before people develop Type 2 diabetes, they almost always have "pre-diabetes." This is when your blood glucose levels are higher than normal, but not yet high enough to be diagnosed as diabetes. Research has shown that some long-term damage to the body, especially the heart and circulatory system, may already be occurring during pre-diabetes. If you take action to manage your blood glucose when you have pre-diabetes, you may delay or prevent Type 2 diabetes from developing. Stress  If you have diabetes, you may be "diet" controlled or on oral medications or insulin to control your diabetes. However, you may find that your blood glucose is higher than  usual in the hospital whether you have diabetes or not. This is often referred to as "stress hyperglycemia." Stress can elevate your blood glucose. This happens because of hormones put out by the body during times of stress. If stress has been the cause of your high blood glucose, it can be followed regularly by your caregiver. That way he/she can make sure your hyperglycemia does not continue to get worse or progress to diabetes. Steroids  Steroids are medications that act on the infection fighting system (immune system) to block inflammation or infection. One side effect can be a rise in blood glucose. Most people can produce enough extra insulin to allow for this rise, but for those who cannot, steroids make blood glucose levels go even higher. It is not unusual for steroid treatments to "uncover" diabetes that is developing. It is not always possible to determine if the hyperglycemia  will go away after the steroids are stopped. A special blood test called an A1c is sometimes done to determine if your blood glucose was elevated before the steroids were started. SYMPTOMS  Thirsty.  Frequent urination.  Dry mouth.  Blurred vision.  Tired or fatigue.  Weakness.  Sleepy.  Tingling in feet or leg. DIAGNOSIS  Diagnosis is made by monitoring blood glucose in one or all of the following ways:  A1c test. This is a chemical found in your blood.  Fingerstick blood glucose monitoring.  Laboratory results. TREATMENT  First, knowing the cause of the hyperglycemia is important before the hyperglycemia can be treated. Treatment may include, but is not be limited to:  Education.  Change or adjustment in medications.  Change or adjustment in meal plan.  Treatment for an illness, infection, etc.  More frequent blood glucose monitoring.  Change in exercise plan.  Decreasing or stopping steroids.  Lifestyle changes. HOME CARE INSTRUCTIONS   Test your blood glucose as directed.  Exercise regularly. Your caregiver will give you instructions about exercise. Pre-diabetes or diabetes which comes on with stress is helped by exercising.  Eat wholesome, balanced meals. Eat often and at regular, fixed times. Your caregiver or nutritionist will give you a meal plan to guide your sugar intake.  Being at an ideal weight is important. If needed, losing as little as 10 to 15 pounds may help improve blood glucose levels. SEEK MEDICAL CARE IF:   You have questions about medicine, activity, or diet.  You continue to have symptoms (problems such as increased thirst, urination, or weight gain). SEEK IMMEDIATE MEDICAL CARE IF:   You are vomiting or have diarrhea.  Your breath smells fruity.  You are breathing faster or slower.  You are very sleepy or incoherent.  You have numbness, tingling, or pain in your feet or hands.  You have chest pain.  Your symptoms get  worse even though you have been following your caregiver's orders.  If you have any other questions or concerns. Document Released: 05/04/2001 Document Revised: 01/31/2012 Document Reviewed: 03/06/2012 [facility] Patient Information 2015 [place], [place]. This information is not intended to replace advice given to you by your health care provider. Make sure you discuss any questions you have with your health care provider.

## 2014-05-11 NOTE — ED Notes (Signed)
CBG 248  

## 2014-05-13 NOTE — ED Provider Notes (Signed)
Medical screening examination/treatment/procedure(s) were performed by non-physician practitioner and as supervising physician I was immediately available for consultation/collaboration.   EKG Interpretation None       Hurman HornJohn M Bednar, MD 05/13/14 [phone]

## 2017-02-25 ENCOUNTER — Emergency Department (HOSPITAL_BASED_OUTPATIENT_CLINIC_OR_DEPARTMENT_OTHER): Payer: Self-pay

## 2017-02-25 ENCOUNTER — Emergency Department (HOSPITAL_BASED_OUTPATIENT_CLINIC_OR_DEPARTMENT_OTHER)
Admission: EM | Admit: 2017-02-25 | Discharge: 2017-02-25 | Disposition: A | Discharge status: Home / Self Care | Payer: Self-pay | Attending: Emergency Medicine | Admitting: Emergency Medicine

## 2017-02-25 ENCOUNTER — Encounter (HOSPITAL_BASED_OUTPATIENT_CLINIC_OR_DEPARTMENT_OTHER): Payer: Self-pay | Admitting: *Deleted

## 2017-02-25 DIAGNOSIS — R05 Cough: Secondary | ICD-10-CM | POA: Insufficient documentation

## 2017-02-25 DIAGNOSIS — R300 Dysuria: Secondary | ICD-10-CM | POA: Insufficient documentation

## 2017-02-25 DIAGNOSIS — R1031 Right lower quadrant pain: Secondary | ICD-10-CM

## 2017-02-25 DIAGNOSIS — N83202 Unspecified ovarian cyst, left side: Secondary | ICD-10-CM | POA: Insufficient documentation

## 2017-02-25 DIAGNOSIS — R0981 Nasal congestion: Secondary | ICD-10-CM | POA: Insufficient documentation

## 2017-02-25 DIAGNOSIS — J029 Acute pharyngitis, unspecified: Secondary | ICD-10-CM | POA: Insufficient documentation

## 2017-02-25 DIAGNOSIS — E119 Type 2 diabetes mellitus without complications: Secondary | ICD-10-CM | POA: Insufficient documentation

## 2017-02-25 DIAGNOSIS — Z794 Long term (current) use of insulin: Secondary | ICD-10-CM | POA: Insufficient documentation

## 2017-02-25 DIAGNOSIS — F17200 Nicotine dependence, unspecified, uncomplicated: Secondary | ICD-10-CM | POA: Insufficient documentation

## 2017-02-25 HISTORY — DX: Type 2 diabetes mellitus without complications: E11.9

## 2017-02-25 LAB — COMPREHENSIVE METABOLIC PANEL
ALBUMIN: 3.7 g/dL (ref 3.5–5.0)
ALT: 40 U/L (ref 14–54)
AST: 36 U/L (ref 15–41)
Alkaline Phosphatase: 107 U/L (ref 38–126)
Anion gap: 10 (ref 5–15)
BUN: 13 mg/dL (ref 6–20)
CHLORIDE: 99 mmol/L — AB (ref 101–111)
CO2: 22 mmol/L (ref 22–32)
CREATININE: 0.87 mg/dL (ref 0.44–1.00)
Calcium: 8.4 mg/dL — ABNORMAL LOW (ref 8.9–10.3)
GFR calc Af Amer: >60 mL/min (ref >60)
Glucose, Bld: 221 mg/dL — ABNORMAL HIGH (ref 65–99)
POTASSIUM: 3.8 mmol/L (ref 3.5–5.1)
SODIUM: 131 mmol/L — AB (ref 135–145)
Total Bilirubin: 0.5 mg/dL (ref 0.3–1.2)
Total Protein: 7.7 g/dL (ref 6.5–8.1)

## 2017-02-25 LAB — CBC WITH DIFFERENTIAL/PLATELET
Basophils Absolute: 0 10*3/uL (ref 0.0–0.1)
Basophils Relative: 0 %
EOS PCT: 0 %
Eosinophils Absolute: 0 10*3/uL (ref 0.0–0.7)
HCT: 47.7 % — ABNORMAL HIGH (ref 36.0–46.0)
HEMOGLOBIN: 16.4 g/dL — AB (ref 12.0–15.0)
LYMPHS ABS: 1.4 10*3/uL (ref 0.7–4.0)
LYMPHS PCT: 16 %
MCH: 31.1 pg (ref 26.0–34.0)
MCHC: 34.4 g/dL (ref 30.0–36.0)
MCV: 90.3 fL (ref 78.0–100.0)
MONOS PCT: 12 %
Monocytes Absolute: 1.1 10*3/uL — ABNORMAL HIGH (ref 0.1–1.0)
Neutro Abs: 6.5 10*3/uL (ref 1.7–7.7)
Neutrophils Relative %: 72 %
PLATELETS: 177 10*3/uL (ref 150–400)
RBC: 5.28 MIL/uL — AB (ref 3.87–5.11)
RDW: 13.9 % (ref 11.5–15.5)
WBC: 9 10*3/uL (ref 4.0–10.5)

## 2017-02-25 LAB — URINALYSIS, MICROSCOPIC (REFLEX)

## 2017-02-25 LAB — URINALYSIS, ROUTINE W REFLEX MICROSCOPIC
Bilirubin Urine: NEGATIVE
GLUCOSE, UA: 100 mg/dL — AB
HGB URINE DIPSTICK: NEGATIVE
Ketones, ur: 15 mg/dL — AB
LEUKOCYTES UA: NEGATIVE
Nitrite: NEGATIVE
PH: 6 (ref 5.0–8.0)
Protein, ur: 100 mg/dL — AB
Specific Gravity, Urine: 1.023 (ref 1.005–1.030)

## 2017-02-25 LAB — LIPASE, BLOOD: Lipase: 33 U/L (ref 11–51)

## 2017-02-25 MED ORDER — SODIUM CHLORIDE 0.9 % IV BOLUS (SEPSIS)
1000.0000 mL | Freq: Once | INTRAVENOUS | Status: AC
  Administered 2017-02-25: 1000 mL via INTRAVENOUS

## 2017-02-25 MED ORDER — CEPHALEXIN 500 MG PO CAPS: 500.0000 mg | ORAL_CAPSULE | Freq: Three times a day (TID) | ORAL | 0 refills | Status: DC

## 2017-02-25 MED ORDER — IOPAMIDOL (ISOVUE-300) INJECTION 61%
100.0000 mL | Freq: Once | INTRAVENOUS | Status: AC | PRN
  Administered 2017-02-25: 100 mL via INTRAVENOUS

## 2017-02-25 MED ORDER — ACETAMINOPHEN 500 MG PO TABS: 500.0000 mg | ORAL_TABLET | Freq: Four times a day (QID) | ORAL | 0 refills | Status: DC | PRN

## 2017-02-25 MED ORDER — ACETAMINOPHEN 325 MG PO TABS
650.0000 mg | ORAL_TABLET | Freq: Once | ORAL | Status: AC | PRN
  Administered 2017-02-25: 650 mg via ORAL
  Filled 2017-02-25: qty 2

## 2017-02-25 MED ORDER — ONDANSETRON HCL 4 MG/2ML IJ SOLN
4.0000 mg | Freq: Once | INTRAMUSCULAR | Status: AC
  Administered 2017-02-25: 4 mg via INTRAVENOUS
  Filled 2017-02-25: qty 2

## 2017-02-25 NOTE — ED Provider Notes (Signed)
Received sign out from Sharen Heck, [place].  Pt here with lower abd pain, fever, and untreated UTI.  I was asked to f/u on abd/pelvis CT.  CT shows in interval enlargement of a pelvic mass from prior CT in 2015.  Will obtain pelvic US for further evaluation.    10:29 PM Pelvic US shows minimally complicated L ovarian cyst measuring 7x9x12cm which increases in size as compare to prior image in 2015.  NO suspicious solid component.  Likely serous cystadenoma or possible peritoneal inclusion cyst.  Recommend GYN consultation for possible excision.    I discussed this finding with pt, she request GYN consultation.    10:45 PM Appreciate consultation from GYN oncall provider, Dr. Emelda Fear who will notify [facility] to contact pt in 3 working day for f/u.  He also recommend check CA125. Pt given f/u information.  Will refill Bactrim for dysuria.  Outpt f/u recommended. Pt voice understanding and agrees with plan.    BP 105/60   Pulse 80   Temp 98.3 F (36.8 C)   Resp 16   Ht  (1.676 m)   Wt 91.6 kg   SpO2 90%   BMI 32.60 kg/m   Results for orders placed or performed during the hospital encounter of 02/25/17  Comprehensive metabolic panel  Result Value Ref Range   Sodium 131 (L) 135 - 145 mmol/L   Potassium 3.8 3.5 - 5.1 mmol/L   Chloride 99 (L) 101 - 111 mmol/L   CO2 22 22 - 32 mmol/L   Glucose, Bld 221 (H) 65 - 99 mg/dL   BUN 13 6 - 20 mg/dL   Creatinine, Ser 4.09 0.44 - 1.00 mg/dL   Calcium 8.4 (L) 8.9 - 10.3 mg/dL   Total Protein 7.7 6.5 - 8.1 g/dL   Albumin 3.7 3.5 - 5.0 g/dL   AST 36 15 - 41 U/L   ALT 40 14 - 54 U/L   Alkaline Phosphatase 107 38 - 126 U/L   Total Bilirubin 0.5 0.3 - 1.2 mg/dL   GFR calc non Af Amer >60 >60 mL/min   GFR calc Af Amer >60 >60 mL/min   Anion gap 10 5 - 15  CBC with Differential/Platelet  Result Value Ref Range   WBC 9.0 4.0 - 10.5 K/uL   RBC 5.28 (H) 3.87 - 5.11 MIL/uL   Hemoglobin 16.4 (H) 12.0 - 15.0 g/dL   HCT 81.1  (H) 91.4 - 46.0 %   MCV 90.3 78.0 - 100.0 fL   MCH 31.1 26.0 - 34.0 pg   MCHC 34.4 30.0 - 36.0 g/dL   RDW 78.2 95.6 - 21.3 %   Platelets 177 150 - 400 K/uL   Neutrophils Relative % 72 %   Neutro Abs 6.5 1.7 - 7.7 K/uL   Lymphocytes Relative 16 %   Lymphs Abs 1.4 0.7 - 4.0 K/uL   Monocytes Relative 12 %   Monocytes Absolute 1.1 (H) 0.1 - 1.0 K/uL   Eosinophils Relative 0 %   Eosinophils Absolute 0.0 0.0 - 0.7 K/uL   Basophils Relative 0 %   Basophils Absolute 0.0 0.0 - 0.1 K/uL  Lipase, blood  Result Value Ref Range   Lipase 33 11 - 51 U/L  Urinalysis, Routine w reflex microscopic  Result Value Ref Range   Color, Urine AMBER (A) YELLOW   APPearance CLOUDY (A) CLEAR   Specific Gravity, Urine 1.023 1.005 - 1.030   pH 6.0 5.0 - 8.0   Glucose, UA  100 (A) NEGATIVE mg/dL   Hgb urine dipstick NEGATIVE NEGATIVE   Bilirubin Urine NEGATIVE NEGATIVE   Ketones, ur 15 (A) NEGATIVE mg/dL   Protein, ur 161 (A) NEGATIVE mg/dL   Nitrite NEGATIVE NEGATIVE   Leukocytes, UA NEGATIVE NEGATIVE  Urinalysis, Microscopic (reflex)  Result Value Ref Range   RBC / HPF 0-5 0 - 5 RBC/hpf   WBC, UA 0-5 0 - 5 WBC/hpf   Bacteria, UA FEW (A) NONE SEEN   Squamous Epithelial / LPF 0-5 (A) NONE SEEN   Urine-Other MICROSCOPIC EXAM PERFORMED ON UNCONCENTRATED URINE    Dg Chest 2 View  Result Date: 02/25/2017 CLINICAL DATA:  Cough, congestion and fever since Wednesday, history diabetes mellitus, smoking EXAM: CHEST  2 VIEW COMPARISON:  04/01/2004 FINDINGS: Minimal enlargement of cardiac silhouette. Mediastinal contours and pulmonary vascularity normal. Increased markings at RIGHT base question infiltrate. Mild central peribronchial thickening. No pleural effusion or pneumothorax. Bones demineralized. IMPRESSION: Bronchitic changes with question mild RIGHT basilar infiltrate. Electronically Signed   By: Ulyses Southward M.D.   On: 02/25/2017 17:36   US Transvaginal Non-ob  Result Date: 02/25/2017 CLINICAL DATA:  Pelvic  mass seen on CT scan today. Previous hysterectomy. Right-sided abdominal pain 1 week. EXAM: TRANSABDOMINAL AND TRANSVAGINAL ULTRASOUND OF PELVIS TECHNIQUE: Both transabdominal and transvaginal ultrasound examinations of the pelvis were performed. Transabdominal technique was performed for global imaging of the pelvis including uterus, ovaries, adnexal regions, and pelvic cul-de-sac. It was necessary to proceed with endovaginal exam following the transabdominal exam to visualize the ovaries. COMPARISON:  CT today and 05/11/2014 FINDINGS: Uterus Previous hysterectomy. Endometrium Previous hysterectomy. Right ovary Not visualized. Left ovary Not visualized. Other findings 7 x 9 x 12 cm well-defined oval cyst in the left adnexa. There is mild mobile echogenic debris within the cyst. No significant solid component or septations. No definite internal vascularity. This is likely ovarian in origin when compared recent the CT scan. No free pelvic fluid. IMPRESSION: Minimally complicated left ovarian cyst measuring 7 x 9 x 12 cm increased in size significantly when compared to previous CT 05/11/2014. No suspicious solid components. May represent serous cystadenoma or possible peritoneal inclusion cyst if ovary previously removed. Recommend GYN consultation for possible excision. Prior hysterectomy. Electronically Signed   By: Elberta Fortis M.D.   On: 02/25/2017 21:46   US Pelvis Complete  Result Date: 02/25/2017 CLINICAL DATA:  Pelvic mass seen on CT scan today. Previous hysterectomy. Right-sided abdominal pain 1 week. EXAM: TRANSABDOMINAL AND TRANSVAGINAL ULTRASOUND OF PELVIS TECHNIQUE: Both transabdominal and transvaginal ultrasound examinations of the pelvis were performed. Transabdominal technique was performed for global imaging of the pelvis including uterus, ovaries, adnexal regions, and pelvic cul-de-sac. It was necessary to proceed with endovaginal exam following the transabdominal exam to visualize the ovaries.  COMPARISON:  CT today and 05/11/2014 FINDINGS: Uterus Previous hysterectomy. Endometrium Previous hysterectomy. Right ovary Not visualized. Left ovary Not visualized. Other findings 7 x 9 x 12 cm well-defined oval cyst in the left adnexa. There is mild mobile echogenic debris within the cyst. No significant solid component or septations. No definite internal vascularity. This is likely ovarian in origin when compared recent the CT scan. No free pelvic fluid. IMPRESSION: Minimally complicated left ovarian cyst measuring 7 x 9 x 12 cm increased in size significantly when compared to previous CT 05/11/2014. No suspicious solid components. May represent serous cystadenoma or possible peritoneal inclusion cyst if ovary previously removed. Recommend GYN consultation for possible excision. Prior hysterectomy. Electronically Signed  By: Daniel  Boyle M.Elberta Fortis4/04/2017 21:46   Ct Abdomen Pelvis W Contrast  Result Date: 02/25/2017 CLINICAL DATA:  Right lower quadrant and flank pain with fever EXAM: CT ABDOMEN AND PELVIS WITH CONTRAST TECHNIQUE: Multidetector CT imaging of the abdomen and pelvis was performed using the standard protocol following bolus administration of intravenous contrast. CONTRAST:  ISOVUE-300 IOPAMIDOL (ISOVUE-300) INJECTION 61% COMPARISON:  05/11/2014 FINDINGS: Lower chest: Lung bases demonstrate linear atelectasis at the right lung base. The heart is nonenlarged. There are coronary artery calcifications. Hepatobiliary: Hepatic steatosis. Subcentimeter lesion in the medial segment of left hepatic lobe, too small to further characterize. No calcified stones or biliary dilatation Pancreas: Unremarkable. No pancreatic ductal dilatation or surrounding inflammatory changes. Spleen: Normal in size without focal abnormality. Adrenals/Urinary Tract: Adrenal glands are unremarkable. Kidneys are normal, without renal calculi, focal lesion, or hydronephrosis. Bladder is unremarkable. Stomach/Bowel: The  stomach is nonenlarged. There is no dilated small bowel. No colon wall thickening. The appendix is not well visualized but there is no right lower quadrant inflammation seen. Vascular/Lymphatic: Aortic atherosclerosis. No enlarged abdominal or pelvic lymph nodes. Reproductive: The uterus is surgically absent. Interval increase in size of a pelvic cystic mass, now measuring 11.5 x 8.8 cm. Other: No free air or free fluid. Musculoskeletal: Degenerative changes. No acute or suspicious bone lesion. IMPRESSION: 1. No right lower quadrant inflammatory process is visualized. 2. Hepatic steatosis 3. Interval increase in size of a pelvic/adnexal cystic mass, now measuring 11.5 cm in maximum diameter. Recommend correlation with pelvic ultrasound. Electronically Signed   By: Jasmine Pang M.D.   On: 02/25/2017 20:32        Fayrene Helper, PA-C 02/25/17 [id number]    Maia Plan, MD 02/27/17 [phone]

## 2017-02-25 NOTE — ED Notes (Signed)
Skin color WNL, very warm to touch

## 2017-02-25 NOTE — ED Notes (Signed)
Patient transported to CT 

## 2017-02-25 NOTE — ED Notes (Signed)
Pt just completed 2nd Bolus of NS IV

## 2017-02-25 NOTE — ED Triage Notes (Signed)
Fever, cough, mid right sided abdominal pain x 2 days.

## 2017-02-25 NOTE — ED Notes (Signed)
Pt c/o nausea, medicated per standing orders

## 2017-02-25 NOTE — ED Notes (Signed)
Pt states feeling some better after 1liter IV NS bolus, also denies any further nausea after IV med given per protocol

## 2017-02-25 NOTE — ED Notes (Signed)
ED Provider at bedside. 

## 2017-02-25 NOTE — ED Notes (Signed)
Pt states she has had a low grade fever the past two days, also c/o RLQ pain x 3 days. Poor appetite, denies any nausea or vomiting, having diarrhea.

## 2017-02-25 NOTE — ED Notes (Signed)
Patient drinking contrast. Assisted to the [facility] and back.

## 2017-02-25 NOTE — ED Notes (Signed)
Bedside Hand Off report given to Kelli-RN 

## 2017-02-25 NOTE — Discharge Instructions (Signed)
You have been evaluated for your pain.  Please take tylenol or ibuprofen as needed for fever and pain.  Take Keflex for a urinary tract infection.  You have a 7x9x12cm ovarian cyst that will need further evaluation by a gynocologist.  [facility] will contact you in the next 3 working days to schedule an appointment.  If you do not hear from them, please call [phone] to set up an appointment.  Also follow up with your doctor for further care.

## 2017-02-25 NOTE — ED Notes (Signed)
Pt states took  Ibuprofen PO taken yesterday, as last dose

## 2017-02-25 NOTE — ED Notes (Signed)
Patient transported to Ultrasound 

## 2017-02-25 NOTE — ED Notes (Signed)
O2 saturations in the high 80's, placed pt on 2L nasal cannula.  Pt finished first bottle of contrast, encouraged to drink second.

## 2017-02-25 NOTE — ED Provider Notes (Signed)
[facility] Provider Note   CSN: [id number] Arrival date & time: 02/25/17  1339     History   Chief Complaint Chief Complaint  Patient presents with  . Fever    HPI Jill Barnes is a 56 y.o. female with pertinent past medical history of insulin dependent T2DM, kidney stone, left ovarian cyst presents to the emergency department RLQ abdominal pain that radiates to the right flank associated with urinary frequency and nausea  3 days.  Relative at the bedside reports that patient has had decreased appetite, the last time she had a full meal was 3 days ago. Patient also not drinking water, patient states that water tastes like "rust". Patient notes that she was diagnosed with a urinary tract infection 1.5 weeks ago and was given Keflex, her urinary symptoms were not completely resolved by the end of her antibiotic course so she contacted her primary care provider again and she was advised to pick up Macrobid at the pharmacy 5 days ago. Patient states that she never went to the pharmacy to pick up Macrobid. Patient currently continues to have urinary frequency, diffuse lower abdominal pain and right flank pain with nausea. No vomiting, chest pain, shortness of breath, hematuria, constipation, diarrhea, melena, hematochezia, vaginal bleeding, vaginal discharge or irritation. Past surgical history significant for abdominal hysterectomy, bladder surgery, bilateral tubal ligation.  Patient also reports sneezing, cough and fever that started around the same time that her right lower quadrant abdominal pain started. Patient states that the symptoms feel like she has a cold. She notes that she visited family for Easter and a family member had an upper respiratory illness as well.   HPI  Past Medical History:  Diagnosis Date  . Diabetes mellitus without complication (HCC)   . Kidney stone     There are no active problems to display for this patient.   Past Surgical History:    Procedure Laterality Date  . ABDOMINAL HYSTERECTOMY    . BLADDER SURGERY      OB History    No data available       Home Medications    Prior to Admission medications   Medication Sig Start Date End Date Taking? Authorizing Provider  GLIPIZIDE PO Take by mouth.   Yes Historical Provider, MD  insulin NPH Human (HUMULIN N,NOVOLIN N) 100 UNIT/ML injection Inject into the skin.   Yes Historical Provider, MD  metFORMIN (GLUCOPHAGE) 1000 MG tablet Take 0.5 tablets (500 mg total) by mouth 2 (two) times daily. 05/11/14   Elson Areas, PA-C  OVER THE COUNTER MEDICATION Take 1 tablet by mouth daily as needed. OTC allergy medicine.    Historical Provider, MD  oxyCODONE-acetaminophen (PERCOCET/ROXICET) 5-325 MG per tablet Take 2 tablets by mouth every 4 (four) hours as needed for severe pain. 05/11/14   Elson Areas, PA-C  tamsulosin (FLOMAX) 0.4 MG CAPS capsule Take 1 capsule (0.4 mg total) by mouth daily. 05/11/14   Elson Areas, PA-C    Family History No family history on file.  Social History Social History  Substance Use Topics  . Smoking status: Current Every Day Smoker    Packs/day: 1.00  . Smokeless tobacco: Never Used  . Alcohol use No     Allergies   Sulfa antibiotics   Review of Systems Review of Systems  Constitutional: Positive for fever. Negative for diaphoresis.  HENT: Positive for congestion, sneezing and sore throat.   Respiratory: Positive for cough. Negative for choking and shortness of  breath.   Cardiovascular: Negative for chest pain and palpitations.  Gastrointestinal: Positive for abdominal pain. Negative for blood in stool, constipation, diarrhea, nausea and vomiting.  Genitourinary: Positive for difficulty urinating, flank pain and frequency. Negative for decreased urine volume, dysuria, hematuria, pelvic pain, urgency, vaginal bleeding, vaginal discharge and vaginal pain.  Musculoskeletal: Positive for back pain (right flank). Negative for myalgias.   Allergic/Immunologic: Positive for immunocompromised state (insulin dependent T2DM).  Neurological: Negative for headaches.     Physical Exam Updated Vital Signs BP (!) 148/83 (BP Location: Right Arm)   Pulse 95   Temp (!) 101.7 F (38.7 C) (Rectal)   Resp 14   Ht  (1.676 m)   Wt 91.6 kg   SpO2 93%   BMI 32.60 kg/m   Physical Exam  Constitutional: She is oriented to person, place, and time. She appears well-developed and well-nourished.  Patient feels warm to touch  HENT:  Head: Normocephalic and atraumatic.  Nose: Nose normal.  Mouth/Throat: Mucous membranes are dry. Posterior oropharyngeal erythema present. No oropharyngeal exudate.  Dry mucous membranes Erythematous posterior oropharynx without edema Tonsils are nonedematous without exudates Mild, bilateral mucosal edema with clear nasal discharge Uvula is midline No trismus  Eyes: Conjunctivae and EOM are normal. Pupils are equal, round, and reactive to light.  Neck: Normal range of motion. Neck supple. No JVD present.  No cervical adenopathy  Cardiovascular: Normal rate, regular rhythm, normal heart sounds and intact distal pulses.   No murmur heard. Pulmonary/Chest: Effort normal and breath sounds normal. No respiratory distress. She has no wheezes. She has no rales. She exhibits no tenderness.  Abdominal: Soft. Bowel sounds are normal. She exhibits no distension and no mass. There is tenderness in the right lower quadrant, suprapubic area and left lower quadrant. There is no rebound, no guarding and no CVA tenderness.  +Surgical abdominal scars noted.  + Active bowel sounds throughout.  + Diffuse lower abdominal tenderness without distention, rigidity, guarding or rebound.  No suprapubic tenderness.  No CVAT.  No pulsating masses.  Negative Murphy's. Negative McBurney's. Negative Psoas sign.  Non palpable kidneys. No hepatosplenomegaly.   Musculoskeletal: Normal range of motion. She exhibits no deformity.   Neurological: She is alert and oriented to person, place, and time. No sensory deficit.  Skin: Skin is warm and dry. Capillary refill takes less than 2 seconds.  Psychiatric: She has a normal mood and affect. Her behavior is normal. Judgment and thought content normal.  Nursing note and vitals reviewed.    ED Treatments / Results  Labs (all labs ordered are listed, but only abnormal results are displayed) Labs Reviewed  COMPREHENSIVE METABOLIC PANEL - Abnormal; Notable for the following:       Result Value   Sodium 131 (*)    Chloride 99 (*)    Glucose, Bld 221 (*)    Calcium 8.4 (*)    All other components within normal limits  CBC WITH DIFFERENTIAL/PLATELET - Abnormal; Notable for the following:    RBC 5.28 (*)    Hemoglobin 16.4 (*)    HCT 47.7 (*)    Monocytes Absolute 1.1 (*)    All other components within normal limits  URINALYSIS, ROUTINE W REFLEX MICROSCOPIC - Abnormal; Notable for the following:    Color, Urine AMBER (*)    APPearance CLOUDY (*)    Glucose, UA 100 (*)    Ketones, ur 15 (*)    Protein, ur 100 (*)    All other components within  normal limits  URINALYSIS, MICROSCOPIC (REFLEX) - Abnormal; Notable for the following:    Bacteria, UA FEW (*)    Squamous Epithelial / LPF 0-5 (*)    All other components within normal limits  LIPASE, BLOOD    EKG  EKG Interpretation None       Radiology No results found.  Procedures Procedures (including critical care time)  Medications Ordered in ED Medications  ondansetron (ZOFRAN) injection 4 mg (4 mg Intravenous Given 02/25/17 1444)  sodium chloride 0.9 % bolus 1,000 mL (0 mLs Intravenous Stopped 02/25/17 1458)     Initial Impression / Assessment and Plan / ED Course  I have reviewed the triage vital signs and the nursing notes.  Pertinent labs & imaging results that were available during my care of the patient were reviewed by me and considered in my medical decision making (see chart for  details).  Clinical Course as of Feb 25 1714  Fri Feb 25, 2017  1632 Temp: (!) 101.7 F (38.7 C) [CG]  1633 BP initially 102/66, much improved at 2L IVF BP: (!) 148/83 [CG]  1633 Resp: 14 [CG]  1633 SpO2: 93 % [CG]  1633 WBC: 9.0 [CG]  1633 Hemoglobin: (!) 16.4 [CG]  1633 Sodium: (!) 131 [CG]  1633 Potassium: 3.8 [CG]  1633 Chloride: (!) 99 [CG]  1633 Glucose: (!) 221 [CG]  1633 BUN: 13 [CG]  1633 Creatinine: 0.87 [CG]  1633 AST: 36 [CG]  1633 ALT: 40 [CG]  1633 Alkaline Phosphatase: 107 [CG]  1654 Nitrite: NEGATIVE [CG]  1654 Leukocytes, UA: NEGATIVE [CG]  1654 WBC, UA: 0-5 [CG]    Clinical Course User Index [CG] Liberty Handy, PA-C   56 year old female with pertinent past medical history of insulin-dependent type 2 diabetes, kidney stone, right ovarian cyst presents to the ED with right lower abdominal pain, nausea, fever, decreased appetite, urinary frequency 3 days. Patient recently treated for UTI by PCP with Keflex, patient states Keflex did not get rid of her urinary frequency and was supposed to pick up Macrobid 5 days ago but wasn't able to. On exam patient is febrile, hemodynamically stable without tachycardia hypotension or tachypnea. Oxygen saturations have been towards the low end of normal ~93%. Patient also reports influenza like illness, with known sick contacts last week.  On exam patient has dry mucous membranes, diffuse right lower quadrant abdominal tenderness most significant at RLQ.  No CVA tenderness. Concerned for appendicitis, obstruction given h/o abdominal surgeries, partially treated UTI, pyelonephritis, kidney stone or infected kidney stone. Less likely diverticulitis.  Lab work so far is reassuring. Electrolytes are within normal limits. No leukocytosis. LFTs within normal limits. No nitrites, leukocytes or white blood cells in urinalysis to suggest UTI or pyelo.  No hematuria, less likely nephrolithiasis.  Given h/o of abdominal surgeries, focal RLQ  abdominal tenderness, fever, decreased appetite and nausea will get CT scan to r/o appy, SBO, diverticulitis.  Given borderline oxygen saturations, fever, cough will get a chest x-ray to rule out consolidation.  Patient, ED treatment and discharge plan was discussed with supervising physician who is agreeable with plan.   Patient handed off to the oncoming ED PA who will follow-up on CT scan and chest x-ray results. If normal imaging, successful PO challenge, improvement in fever and clinical improvement patient will be discharged with Macrobid as instructed by PCP follow-up in office in 2-3 days for reevaluation.  Final Clinical Impressions(s) / ED Diagnoses   Final diagnoses:  None    New  Prescriptions New Prescriptions   No medications on file     Liberty Handy, Cordelia Poche 02/25/17 1717    Maia Plan, MD 02/27/17 1550

## 2017-02-27 LAB — CA 125: CA 125: 23 U/mL (ref 0.0–38.1)

## 2017-12-15 ENCOUNTER — Observation Stay (HOSPITAL_COMMUNITY): Payer: Self-pay

## 2017-12-15 ENCOUNTER — Inpatient Hospital Stay (HOSPITAL_BASED_OUTPATIENT_CLINIC_OR_DEPARTMENT_OTHER)
Admission: EM | Admit: 2017-12-15 | Discharge: 2017-12-18 | DRG: 190 | Disposition: A | Discharge status: Home / Self Care | Payer: Self-pay | Attending: Internal Medicine | Admitting: Internal Medicine

## 2017-12-15 ENCOUNTER — Encounter (HOSPITAL_BASED_OUTPATIENT_CLINIC_OR_DEPARTMENT_OTHER): Payer: Self-pay | Admitting: *Deleted

## 2017-12-15 ENCOUNTER — Other Ambulatory Visit: Payer: Self-pay

## 2017-12-15 ENCOUNTER — Emergency Department (HOSPITAL_BASED_OUTPATIENT_CLINIC_OR_DEPARTMENT_OTHER): Payer: Self-pay

## 2017-12-15 ENCOUNTER — Observation Stay (HOSPITAL_BASED_OUTPATIENT_CLINIC_OR_DEPARTMENT_OTHER): Payer: Self-pay

## 2017-12-15 DIAGNOSIS — Z87442 Personal history of urinary calculi: Secondary | ICD-10-CM

## 2017-12-15 DIAGNOSIS — F17219 Nicotine dependence, cigarettes, with unspecified nicotine-induced disorders: Secondary | ICD-10-CM

## 2017-12-15 DIAGNOSIS — J40 Bronchitis, not specified as acute or chronic: Secondary | ICD-10-CM

## 2017-12-15 DIAGNOSIS — D751 Secondary polycythemia: Secondary | ICD-10-CM | POA: Diagnosis present

## 2017-12-15 DIAGNOSIS — Z79899 Other long term (current) drug therapy: Secondary | ICD-10-CM

## 2017-12-15 DIAGNOSIS — Z794 Long term (current) use of insulin: Secondary | ICD-10-CM

## 2017-12-15 DIAGNOSIS — G4733 Obstructive sleep apnea (adult) (pediatric): Secondary | ICD-10-CM | POA: Diagnosis present

## 2017-12-15 DIAGNOSIS — R000 Tachycardia, unspecified: Secondary | ICD-10-CM

## 2017-12-15 DIAGNOSIS — Z23 Encounter for immunization: Secondary | ICD-10-CM

## 2017-12-15 DIAGNOSIS — E876 Hypokalemia: Secondary | ICD-10-CM | POA: Diagnosis present

## 2017-12-15 DIAGNOSIS — J441 Chronic obstructive pulmonary disease with (acute) exacerbation: Principal | ICD-10-CM

## 2017-12-15 DIAGNOSIS — E1165 Type 2 diabetes mellitus with hyperglycemia: Secondary | ICD-10-CM | POA: Diagnosis present

## 2017-12-15 DIAGNOSIS — E871 Hypo-osmolality and hyponatremia: Secondary | ICD-10-CM | POA: Diagnosis present

## 2017-12-15 DIAGNOSIS — F17200 Nicotine dependence, unspecified, uncomplicated: Secondary | ICD-10-CM

## 2017-12-15 DIAGNOSIS — J9601 Acute respiratory failure with hypoxia: Secondary | ICD-10-CM | POA: Diagnosis present

## 2017-12-15 DIAGNOSIS — R0902 Hypoxemia: Secondary | ICD-10-CM | POA: Diagnosis present

## 2017-12-15 DIAGNOSIS — E119 Type 2 diabetes mellitus without complications: Secondary | ICD-10-CM

## 2017-12-15 DIAGNOSIS — F17210 Nicotine dependence, cigarettes, uncomplicated: Secondary | ICD-10-CM | POA: Diagnosis present

## 2017-12-15 DIAGNOSIS — IMO0001 Reserved for inherently not codable concepts without codable children: Secondary | ICD-10-CM

## 2017-12-15 DIAGNOSIS — Z90710 Acquired absence of both cervix and uterus: Secondary | ICD-10-CM

## 2017-12-15 LAB — BASIC METABOLIC PANEL
Anion gap: 9 (ref 5–15)
BUN: 13 mg/dL (ref 6–20)
CHLORIDE: 102 mmol/L (ref 101–111)
CO2: 22 mmol/L (ref 22–32)
Calcium: 8.3 mg/dL — ABNORMAL LOW (ref 8.9–10.3)
Creatinine, Ser: 0.75 mg/dL (ref 0.44–1.00)
GFR calc Af Amer: >60 mL/min (ref >60)
GFR calc non Af Amer: >60 mL/min (ref >60)
Glucose, Bld: 277 mg/dL — ABNORMAL HIGH (ref 65–99)
POTASSIUM: 3 mmol/L — AB (ref 3.5–5.1)
SODIUM: 133 mmol/L — AB (ref 135–145)

## 2017-12-15 LAB — CBC WITH DIFFERENTIAL/PLATELET
Basophils Absolute: 0 10*3/uL (ref 0.0–0.1)
Basophils Relative: 1 %
EOS ABS: 0.2 10*3/uL (ref 0.0–0.7)
Eosinophils Relative: 4 %
HEMATOCRIT: 45.3 % (ref 36.0–46.0)
HEMOGLOBIN: 15.4 g/dL — AB (ref 12.0–15.0)
LYMPHS PCT: 27 %
Lymphs Abs: 1.6 10*3/uL (ref 0.7–4.0)
MCH: 31.1 pg (ref 26.0–34.0)
MCHC: 34 g/dL (ref 30.0–36.0)
MCV: 91.5 fL (ref 78.0–100.0)
MONOS PCT: 10 %
Monocytes Absolute: 0.6 10*3/uL (ref 0.1–1.0)
NEUTROS ABS: 3.7 10*3/uL (ref 1.7–7.7)
NEUTROS PCT: 58 %
Platelets: 168 10*3/uL (ref 150–400)
RBC: 4.95 MIL/uL (ref 3.87–5.11)
RDW: 13.6 % (ref 11.5–15.5)
WBC: 6.2 10*3/uL (ref 4.0–10.5)

## 2017-12-15 LAB — D-DIMER, QUANTITATIVE (NOT AT ARMC): D DIMER QUANT: 0.69 ug{FEU}/mL — AB (ref 0.00–0.50)

## 2017-12-15 LAB — BRAIN NATRIURETIC PEPTIDE: B Natriuretic Peptide: 12.9 pg/mL (ref 0.0–100.0)

## 2017-12-15 LAB — GLUCOSE, CAPILLARY: Glucose-Capillary: 405 mg/dL — ABNORMAL HIGH (ref 65–99)

## 2017-12-15 LAB — TROPONIN I

## 2017-12-15 LAB — GLUCOSE, RANDOM: GLUCOSE: 384 mg/dL — AB (ref 65–99)

## 2017-12-15 MED ORDER — IPRATROPIUM-ALBUTEROL 0.5-2.5 (3) MG/3ML IN SOLN
3.0000 mL | Freq: Once | RESPIRATORY_TRACT | Status: AC
  Administered 2017-12-15: 3 mL via RESPIRATORY_TRACT

## 2017-12-15 MED ORDER — ALBUTEROL SULFATE (2.5 MG/3ML) 0.083% IN NEBU
5.0000 mg | INHALATION_SOLUTION | Freq: Once | RESPIRATORY_TRACT | Status: AC
  Administered 2017-12-15: 5 mg via RESPIRATORY_TRACT
  Filled 2017-12-15: qty 6

## 2017-12-15 MED ORDER — PREDNISONE 50 MG PO TABS
60.0000 mg | ORAL_TABLET | Freq: Once | ORAL | Status: AC
  Administered 2017-12-15: 15:00:00 60 mg via ORAL
  Filled 2017-12-15: qty 1

## 2017-12-15 MED ORDER — POTASSIUM CHLORIDE 10 MEQ/100ML IV SOLN
10.0000 meq | Freq: Once | INTRAVENOUS | Status: AC
  Administered 2017-12-15: 10 meq via INTRAVENOUS
  Filled 2017-12-15: qty 100

## 2017-12-15 MED ORDER — INSULIN ASPART 100 UNIT/ML ~~LOC~~ SOLN
0.0000 [IU] | Freq: Every day | SUBCUTANEOUS | Status: DC
  Administered 2017-12-16: 5 [IU] via SUBCUTANEOUS
  Administered 2017-12-17: 4 [IU] via SUBCUTANEOUS

## 2017-12-15 MED ORDER — ALBUTEROL SULFATE (2.5 MG/3ML) 0.083% IN NEBU
2.5000 mg | INHALATION_SOLUTION | Freq: Once | RESPIRATORY_TRACT | Status: AC
  Administered 2017-12-15: 2.5 mg via RESPIRATORY_TRACT

## 2017-12-15 MED ORDER — ALBUTEROL (5 MG/ML) CONTINUOUS INHALATION SOLN
10.0000 mg/h | INHALATION_SOLUTION | Freq: Once | RESPIRATORY_TRACT | Status: AC
  Administered 2017-12-15: 10 mg/h via RESPIRATORY_TRACT

## 2017-12-15 MED ORDER — IPRATROPIUM BROMIDE 0.02 % IN SOLN
0.5000 mg | Freq: Once | RESPIRATORY_TRACT | Status: AC
  Administered 2017-12-15: 0.5 mg via RESPIRATORY_TRACT
  Filled 2017-12-15: qty 2.5

## 2017-12-15 MED ORDER — IPRATROPIUM-ALBUTEROL 0.5-2.5 (3) MG/3ML IN SOLN
RESPIRATORY_TRACT | Status: AC
  Administered 2017-12-15: 3 mL via RESPIRATORY_TRACT
  Filled 2017-12-15: qty 3

## 2017-12-15 MED ORDER — DEXTROSE 5 % IV SOLN
500.0000 mg | INTRAVENOUS | Status: DC
  Administered 2017-12-15: 500 mg via INTRAVENOUS
  Filled 2017-12-15 (×2): qty 500

## 2017-12-15 MED ORDER — INSULIN ASPART 100 UNIT/ML ~~LOC~~ SOLN
0.0000 [IU] | Freq: Three times a day (TID) | SUBCUTANEOUS | Status: DC
  Administered 2017-12-16: 7 [IU] via SUBCUTANEOUS
  Administered 2017-12-16 (×2): 5 [IU] via SUBCUTANEOUS
  Administered 2017-12-17 (×3): 7 [IU] via SUBCUTANEOUS
  Administered 2017-12-18: 3 [IU] via SUBCUTANEOUS

## 2017-12-15 MED ORDER — INSULIN ASPART 100 UNIT/ML ~~LOC~~ SOLN
10.0000 [IU] | Freq: Once | SUBCUTANEOUS | Status: AC
  Administered 2017-12-15: 10 [IU] via SUBCUTANEOUS

## 2017-12-15 MED ORDER — MAGNESIUM SULFATE 2 GM/50ML IV SOLN
2.0000 g | Freq: Once | INTRAVENOUS | Status: AC
  Administered 2017-12-15: 2 g via INTRAVENOUS
  Filled 2017-12-15: qty 50

## 2017-12-15 MED ORDER — IOPAMIDOL (ISOVUE-370) INJECTION 76%
100.0000 mL | Freq: Once | INTRAVENOUS | Status: AC | PRN
  Administered 2017-12-15: 100 mL via INTRAVENOUS

## 2017-12-15 MED ORDER — TIOTROPIUM BROMIDE MONOHYDRATE 18 MCG IN CAPS
18.0000 ug | ORAL_CAPSULE | Freq: Every day | RESPIRATORY_TRACT | Status: DC
  Administered 2017-12-16: 18 ug via RESPIRATORY_TRACT
  Filled 2017-12-15: qty 5

## 2017-12-15 MED ORDER — ALBUTEROL (5 MG/ML) CONTINUOUS INHALATION SOLN
INHALATION_SOLUTION | RESPIRATORY_TRACT | Status: AC
  Filled 2017-12-15: qty 20

## 2017-12-15 MED ORDER — OXYCODONE-ACETAMINOPHEN 5-325 MG PO TABS: 2.0000 | ORAL_TABLET | ORAL | Status: DC | PRN

## 2017-12-15 MED ORDER — METHYLPREDNISOLONE SODIUM SUCC 125 MG IJ SOLR
80.0000 mg | Freq: Three times a day (TID) | INTRAMUSCULAR | Status: DC
  Administered 2017-12-15 – 2017-12-17 (×5): 80 mg via INTRAVENOUS
  Filled 2017-12-15 (×5): qty 2

## 2017-12-15 MED ORDER — METFORMIN HCL 500 MG PO TABS: 500.0000 mg | ORAL_TABLET | Freq: Two times a day (BID) | ORAL | Status: DC

## 2017-12-15 MED ORDER — ALBUTEROL SULFATE (2.5 MG/3ML) 0.083% IN NEBU
2.5000 mg | INHALATION_SOLUTION | Freq: Four times a day (QID) | RESPIRATORY_TRACT | Status: DC
  Administered 2017-12-16: 2.5 mg via RESPIRATORY_TRACT
  Filled 2017-12-15: qty 3

## 2017-12-15 MED ORDER — NICOTINE 21 MG/24HR TD PT24
21.0000 mg | MEDICATED_PATCH | Freq: Every day | TRANSDERMAL | Status: DC
  Administered 2017-12-15 – 2017-12-18 (×4): 21 mg via TRANSDERMAL
  Filled 2017-12-15 (×5): qty 1

## 2017-12-15 MED ORDER — SODIUM CHLORIDE 0.9 % IV BOLUS (SEPSIS)
1000.0000 mL | Freq: Once | INTRAVENOUS | Status: AC
  Administered 2017-12-15: 1000 mL via INTRAVENOUS

## 2017-12-15 MED ORDER — IOPAMIDOL (ISOVUE-370) INJECTION 76%
INTRAVENOUS | Status: AC
  Filled 2017-12-15: qty 100

## 2017-12-15 MED ORDER — ALBUTEROL SULFATE (2.5 MG/3ML) 0.083% IN NEBU
2.5000 mg | INHALATION_SOLUTION | Freq: Three times a day (TID) | RESPIRATORY_TRACT | Status: DC
  Administered 2017-12-15: 2.5 mg via RESPIRATORY_TRACT
  Filled 2017-12-15: qty 3

## 2017-12-15 MED ORDER — ALBUTEROL SULFATE (2.5 MG/3ML) 0.083% IN NEBU
INHALATION_SOLUTION | RESPIRATORY_TRACT | Status: AC
  Administered 2017-12-15: 2.5 mg via RESPIRATORY_TRACT
  Filled 2017-12-15: qty 3

## 2017-12-15 MED ORDER — ENOXAPARIN SODIUM 40 MG/0.4ML ~~LOC~~ SOLN
40.0000 mg | SUBCUTANEOUS | Status: DC
  Administered 2017-12-15 – 2017-12-17 (×3): 40 mg via SUBCUTANEOUS
  Filled 2017-12-15 (×3): qty 0.4

## 2017-12-15 MED ORDER — ALBUTEROL SULFATE (2.5 MG/3ML) 0.083% IN NEBU: 2.5000 mg | INHALATION_SOLUTION | Freq: Four times a day (QID) | RESPIRATORY_TRACT | Status: DC | PRN

## 2017-12-15 NOTE — ED Notes (Signed)
Pt is c/o central chest pain that started after a coughing fit.  She remains wheezy throughout and SOB when speaking.  EKG obtained.

## 2017-12-15 NOTE — Progress Notes (Signed)
HS CBG is 405, put in order for stat lab as per protocol, gave 5 units SSi, notified on call

## 2017-12-15 NOTE — ED Provider Notes (Signed)
[facility] EMERGENCY DEPARTMENT Provider Note   CSN: [id number] Arrival date & time: 12/15/17  1451     History   Chief Complaint Chief Complaint  Patient presents with  . Asthma    HPI Jill Barnes is a 57 y.o. female.  Patient is a 57 year old female with a history of diabetes on oral tablets and insulin presenting today with persistent shortness of breath, cough and wheezing.  Patient states that approximately 8 days ago she started having cough and URI symptoms with runny nose, low-grade fever and general malaise.  She saw her doctor last week and was given an antibiotic but patient was not feeling any better.  She saw her doctor yesterday and was told that she had asthma.  She was given Symbicort, Mucinex and cough suppressant.  She states today she does not feel any better and was feeling more shortness of breath so came in for further evaluation.  Patient states the cough is dry she denies any chest pain, abdominal pain, nausea, vomiting or leg swelling.  She is a smoker for many years.  Never had a diagnosis of COPD or asthma in the past.   The history is provided by the patient.    Past Medical History:  Diagnosis Date  . Diabetes mellitus without complication (HCC)   . Kidney stone     There are no active problems to display for this patient.   Past Surgical History:  Procedure Laterality Date  . ABDOMINAL HYSTERECTOMY    . BLADDER SURGERY      OB History    No data available       Home Medications    Prior to Admission medications   Medication Sig Start Date End Date Taking? Authorizing Provider  GLIPIZIDE PO Take by mouth.   Yes [provider]  insulin NPH Human (HUMULIN N,NOVOLIN N) 100 UNIT/ML injection Inject into the skin.   Yes [provider]  acetaminophen (TYLENOL) 500 MG tablet Take 1 tablet (500 mg total) by mouth every 6 (six) hours as needed. 02/25/17   Fayrene Helper, PA-C  cephALEXin (KEFLEX) 500 MG capsule  Take 1 capsule (500 mg total) by mouth 3 (three) times daily. 02/25/17   Fayrene Helper, PA-C  metFORMIN (GLUCOPHAGE) 1000 MG tablet Take 0.5 tablets (500 mg total) by mouth 2 (two) times daily. 05/11/14   Elson Areas, PA-C  OVER THE COUNTER MEDICATION Take 1 tablet by mouth daily as needed. OTC allergy medicine.    [provider]  oxyCODONE-acetaminophen (PERCOCET/ROXICET) 5-325 MG per tablet Take 2 tablets by mouth every 4 (four) hours as needed for severe pain. 05/11/14   Elson Areas, PA-C  tamsulosin (FLOMAX) 0.4 MG CAPS capsule Take 1 capsule (0.4 mg total) by mouth daily. 05/11/14   Elson Areas, PA-C    Family History No family history on file.  Social History Social History   Tobacco Use  . Smoking status: Current Every Day Smoker    Packs/day: 1.00  . Smokeless tobacco: Never Used  Substance Use Topics  . Alcohol use: No  . Drug use: No     Allergies   Sulfa antibiotics   Review of Systems Review of Systems  All other systems reviewed and are negative.    Physical Exam Updated Vital Signs BP (!) 158/109 (BP Location: Left Arm)   Pulse 88   Temp 98.1 F (36.7 C) (Oral)   Resp (!) 22   Ht 5\' 6"  (1.676 m)  Wt 91.6 kg (202 lb)   SpO2 94%   BMI 32.60 kg/m   Physical Exam  Constitutional: She is oriented to person, place, and time. She appears well-developed and well-nourished. No distress.  HENT:  Head: Normocephalic and atraumatic.  Right Ear: Tympanic membrane normal.  Left Ear: Tympanic membrane normal.  Mouth/Throat: Oropharynx is clear and moist.  Eyes: Conjunctivae and EOM are normal. Pupils are equal, round, and reactive to light.  Neck: Normal range of motion. Neck supple.  Cardiovascular: Normal rate, regular rhythm and intact distal pulses.  No murmur heard. Pulmonary/Chest: Effort normal. Tachypnea noted. No respiratory distress. She has wheezes. She has rales.  Coarse crackles in all lung fields in addition to expiratory wheezing   Abdominal: Soft. She exhibits no distension. There is no tenderness. There is no rebound and no guarding.  Musculoskeletal: Normal range of motion. She exhibits no edema or tenderness.  Neurological: She is alert and oriented to person, place, and time.  Skin: Skin is warm and dry. No rash noted. No erythema.  Psychiatric: She has a normal mood and affect. Her behavior is normal.  Nursing note and vitals reviewed.    ED Treatments / Results  Labs (all labs ordered are listed, but only abnormal results are displayed) Labs Reviewed  CBC WITH DIFFERENTIAL/PLATELET - Abnormal; Notable for the following components:      Result Value   Hemoglobin 15.4 (*)    All other components within normal limits  BASIC METABOLIC PANEL - Abnormal; Notable for the following components:   Sodium 133 (*)    Potassium 3.0 (*)    Glucose, Bld 277 (*)    Calcium 8.3 (*)    All other components within normal limits  TROPONIN I    EKG  EKG Interpretation  Date/Time:  Thursday December 15 2017 16:06:50 EST Ventricular Rate:  115 PR Interval:    QRS Duration: 92 QT Interval:  307 QTC Calculation: 425 R Axis:   79 Text Interpretation:  Sinus tachycardia Probable left atrial enlargement Probable LVH with secondary repol abnrm ST depr, consider ischemia, inferior leads No previous tracing Confirmed by Gwyneth Sprout ([id number]) on 12/15/2017 4:18:57 PM       Radiology Dg Chest 2 View  Result Date: 12/15/2017 CLINICAL DATA:  Cough and shortness of breath. EXAM: CHEST  2 VIEW COMPARISON:  02/25/2017 FINDINGS: Lung markings are slightly prominent particularly in the left lower chest. However, these findings are not significantly changed and are suggestive for chronic changes. There is no focal airspace disease or lung consolidation. Heart and mediastinum are within normal limits. Trachea is midline. No large pleural effusions. No acute bone abnormality. IMPRESSION: No active cardiopulmonary disease.  Electronically Signed   By: Richarda Overlie M.D.   On: 12/15/2017 15:31    Procedures Procedures (including critical care time)  Medications Ordered in ED Medications  predniSONE (DELTASONE) tablet 60 mg (not administered)  albuterol (PROVENTIL) (2.5 MG/3ML) 0.083% nebulizer solution 5 mg (not administered)  ipratropium (ATROVENT) nebulizer solution 0.5 mg (not administered)  ipratropium-albuterol (DUONEB) 0.5-2.5 (3) MG/3ML nebulizer solution 3 mL (3 mLs Nebulization Given 12/15/17 1501)  albuterol (PROVENTIL) (2.5 MG/3ML) 0.083% nebulizer solution 2.5 mg (2.5 mg Nebulization Given 12/15/17 1501)     Initial Impression / Assessment and Plan / ED Course  I have reviewed the triage vital signs and the nursing notes.  Pertinent labs & imaging results that were available during my care of the patient were reviewed by me and considered in my medical  decision making (see chart for details).     Pt with symptoms consistent with viral URI at first with persistent wheezing.  Patient appears short of breath here with some tachypnea and sats in the mid 90s.  Patient has wheezing and coarse crackles on exam.  She has no evidence of fluid overload.  No signs of pharyngitis, otitis or abnormal abdominal findings.  Low suspicion for ACS, PE or CHF.  Concern for possible pneumonia versus underlying COPD with exacerbation.  Patient given albuterol, Atrovent and prednisone. CXR pending.  4:17 PM Patient's chest x-ray is within normal limits however after 2 nebs patient is having persistent wheezing and shortness of breath.  Oxygen saturations in the mid 80s requiring 2 L of oxygen to remain greater than 90%.  Patient then had a coughing fit resulting in more wheezing and spasm.  Will start a continuous long albuterol nebulizer as well as magnesium in addition to the steroid she is already received.  EKG shows ST depression inferior laterally without reciprocal changes.  Patient denies of resting chest pain and  only pain after a coughing fit.  Low suspicion that this is cardiac in nature.  CBC and BMP pending.  6:03 PM Labs within normal limits except for hypokalemia of 3.0 and hyperglycemia of 277.  Troponin within normal limits.  After an hour-long continuous neb, magnesium patient is still wheezing still requiring oxygen and still feeling short of breath.  Will admit for further care.  CRITICAL CARE Performed by: Djeneba Barsch Total critical care time: 30 minutes Critical care time was exclusive of separately billable procedures and treating other patients. Critical care was necessary to treat or prevent imminent or life-threatening deterioration. Critical care was time spent personally by me on the following activities: development of treatment plan with patient and/or surrogate as well as nursing, discussions with consultants, evaluation of patient's response to treatment, examination of patient, obtaining history from patient or surrogate, ordering and performing treatments and interventions, ordering and review of laboratory studies, ordering and review of radiographic studies, pulse oximetry and re-evaluation of patient's condition.   Final Clinical Impressions(s) / ED Diagnoses   Final diagnoses:  Bronchitis  Hypoxia    ED Discharge Orders    None       Gwyneth SproutPlunkett, Valaria Kohut, MD 12/15/17 [phone]

## 2017-12-15 NOTE — H&P (Signed)
TRH H&P   Patient Demographics:    Jill Barnes, is a 57 y.o. female  MRN: [id number]   DOB - 08/23/1961  Admit Date - 12/15/2017  Outpatient Primary MD for the patient is [facility] [place] Dr. Conley Rolls [facility]   Referring MD/NP/PA:  Gwyneth Sprout  Outpatient Specialists: none  Patient coming from:  Home=> med center [facility]  Chief Complaint  Patient presents with  . Asthma      HPI:    Jill Barnes  is a 57 y.o. female, w nicotine dependence , nocturnal hypoxia (?undiagnosed Osa) apparently c/o dyspnea for the past 1+ week for which she had received abx.  Pt apparently had worsening dyspnea and cough earlier today. Subjective fever, and cough (dry), pt denies cp, palp , n/v, abd pain, diarrhea, brbpr, black stool, dysuria.  Pt  presented to [facility] ER for dyspnea.   In Ed, pt was noted to be hypoxic. Pox 80's  CXR IMPRESSION: No active cardiopulmonary disease.  Wbc 6.2, hg 15.4, Plt 168 Na 133, K 3.0, Glucose 277 Bun 13, Creatinine 0.75  D dimer 0.69 BNP 12.9  Trop negative,    Pt will be admitted for hypoxia secondary to Copd exacerbation, and tachycardia r/o PE>       Review of systems:    In addition to the HPI above,  No Headache, No changes with Vision or hearing, No problems swallowing food or Liquids, No Chest pain, No Abdominal pain, No Nausea or Vommitting, Bowel movements are regular, No Blood in stool or Urine, No dysuria, No new skin rashes or bruises, No new joints pains-aches,  No new weakness, tingling, numbness in any extremity, No recent weight gain or loss, No polyuria, polydypsia or polyphagia, No significant Mental Stressors.  A full 10 point Review of Systems was done, except as stated above, all other Review of Systems were negative.   With Past History of the following :    Past Medical History:    Diagnosis Date  . Diabetes mellitus without complication (HCC)   . Kidney stone       Past Surgical History:  Procedure Laterality Date  . ABDOMINAL HYSTERECTOMY    . BLADDER SURGERY        Social History:     Social History   Tobacco Use  . Smoking status: Current Every Day Smoker    Packs/day: 1.00  . Smokeless tobacco: Never Used  Substance Use Topics  . Alcohol use: No     Lives - at home  Mobility - walks by self   Family History :     Family History  Problem Relation Age of Onset  . Heart failure Father       Home Medications:   Prior to Admission medications   Medication Sig Start Date End Date Taking? Authorizing Provider  GLIPIZIDE PO Take by mouth.  Yes [provider]  insulin NPH Human (HUMULIN N,NOVOLIN N) 100 UNIT/ML injection Inject into the skin.   Yes [provider]  acetaminophen (TYLENOL) 500 MG tablet Take 1 tablet (500 mg total) by mouth every 6 (six) hours as needed. 02/25/17   Fayrene Helperran, Bowie, PA-C  cephALEXin (KEFLEX) 500 MG capsule Take 1 capsule (500 mg total) by mouth 3 (three) times daily. 02/25/17   Fayrene Helperran, Bowie, PA-C  metFORMIN (GLUCOPHAGE) 1000 MG tablet Take 0.5 tablets (500 mg total) by mouth 2 (two) times daily. 05/11/14   Elson AreasSofia, Leslie K, PA-C  OVER THE COUNTER MEDICATION Take 1 tablet by mouth daily as needed. OTC allergy medicine.    [provider]  oxyCODONE-acetaminophen (PERCOCET/ROXICET) 5-325 MG per tablet Take 2 tablets by mouth every 4 (four) hours as needed for severe pain. 05/11/14   Elson AreasSofia, Leslie K, PA-C  tamsulosin (FLOMAX) 0.4 MG CAPS capsule Take 1 capsule (0.4 mg total) by mouth daily. 05/11/14   Elson AreasSofia, Leslie K, PA-C     Allergies:     Allergies  Allergen Reactions  . Sulfa Antibiotics      Physical Exam:   Vitals  Blood pressure 126/61, pulse (!) 109, temperature 98.2 F (36.8 C), temperature source Oral, resp. rate 13, height 5\' 6"  (1.676 m), weight 91.6 kg (202 lb), SpO2 92  %.   1. General  lying in bed in NAD,    2. Normal affect and insight, Not Suicidal or Homicidal, Awake Alert, Oriented X 3.  3. No F.N deficits, ALL C.Nerves Intact, Strength 5/5 all 4 extremities, Sensation intact all 4 extremities, Plantars down going.  4. Ears and Eyes appear Normal, Conjunctivae clear, PERRLA. Moist Oral Mucosa.  5. Supple Neck, No JVD, No cervical lymphadenopathy appriciated, No Carotid Bruits.  6. Symmetrical Chest wall movement, lungs tight,  Slight crackle right lung base, slight exp wheezing,  Mostly lungs are tight.   7. Tachy s1, s2, 1/6 sem rusb  8. Positive Bowel Sounds, Abdomen Soft, No tenderness, No organomegaly appriciated,No rebound -guarding or rigidity.  9.  No Cyanosis, Normal Skin Turgor, No Skin Rash or Bruise.  10. Good muscle tone,  joints appear normal , no effusions, Normal ROM.  11. No Palpable Lymph Nodes in Neck or Axillae   Data Review:    CBC Recent Labs  Lab 12/15/17 1618  WBC 6.2  HGB 15.4*  HCT 45.3  PLT 168  MCV 91.5  MCH 31.1  MCHC 34.0  RDW 13.6  LYMPHSABS 1.6  MONOABS 0.6  EOSABS 0.2  BASOSABS 0.0   ------------------------------------------------------------------------------------------------------------------  Chemistries  Recent Labs  Lab 12/15/17 1618  NA 133*  K 3.0*  CL 102  CO2 22  GLUCOSE 277*  BUN 13  CREATININE 0.75  CALCIUM 8.3*   ------------------------------------------------------------------------------------------------------------------ estimated creatinine clearance is 90.6 mL/min (by C-G formula based on SCr of 0.75 mg/dL). ------------------------------------------------------------------------------------------------------------------ No results for input(s): TSH, T4TOTAL, T3FREE, THYROIDAB in the last 72 hours.  Invalid input(s): FREET3  Coagulation profile No results for input(s): INR, PROTIME in the last 168  hours. ------------------------------------------------------------------------------------------------------------------- Recent Labs    12/15/17 1618  DDIMER 0.69*   -------------------------------------------------------------------------------------------------------------------  Cardiac Enzymes Recent Labs  Lab 12/15/17 1620  TROPONINI <0.03   ------------------------------------------------------------------------------------------------------------------    Component Value Date/Time   BNP 12.9 12/15/2017 1618     ---------------------------------------------------------------------------------------------------------------  Urinalysis    Component Value Date/Time   COLORURINE AMBER (A) 02/25/2017 1620   APPEARANCEUR CLOUDY (A) 02/25/2017 1620   LABSPEC 1.023 02/25/2017 1620  PHURINE 6.0 02/25/2017 1620   GLUCOSEU 100 (A) 02/25/2017 1620   HGBUR NEGATIVE 02/25/2017 1620   BILIRUBINUR NEGATIVE 02/25/2017 1620   KETONESUR 15 (A) 02/25/2017 1620   PROTEINUR 100 (A) 02/25/2017 1620   UROBILINOGEN 1.0 05/11/2014 1323   NITRITE NEGATIVE 02/25/2017 1620   LEUKOCYTESUR NEGATIVE 02/25/2017 1620    ----------------------------------------------------------------------------------------------------------------   Imaging Results:    Dg Chest 2 View  Result Date: 12/15/2017 CLINICAL DATA:  Cough and shortness of breath. EXAM: CHEST  2 VIEW COMPARISON:  02/25/2017 FINDINGS: Lung markings are slightly prominent particularly in the left lower chest. However, these findings are not significantly changed and are suggestive for chronic changes. There is no focal airspace disease or lung consolidation. Heart and mediastinum are within normal limits. Trachea is midline. No large pleural effusions. No acute bone abnormality. IMPRESSION: No active cardiopulmonary disease. Electronically Signed   By: Richarda Overlie M.D.   On: 12/15/2017 15:31    ST at 115, nl axis,  Slight st depression  v4-6     Assessment & Plan:    Principal Problem:   Hypoxia Active Problems:   COPD exacerbation (HCC)   Tachycardia   Nicotine dependence   Hypoxia secondary to Copd exacerbation r/o PE Solumedrol 80mg  iv q8h zithromax 500mg  iv qday Albuterol neb tid and q6h prn  spiriva 1puff qday Check cbc in am  Tachycardia  Tele Trop iq6h x3 Tsh Check cardiac echo Check CTA chest  Nicotine dep Nicotine patch Pt counselled on smoking cessation x 3 minutes  Hypokalemia Replete  Hyponatremia Ns iv  Check cmp in am  Dm2 fsbs ac and qhs,  ISS  Probably has OSA Will need testing as outpatient       DVT Prophylaxis   Lovenox - SCDs   AM Labs Ordered, also please review Full Orders  Family Communication: Admission, patients condition and plan of care including tests being ordered have been discussed with the patient  who indicate understanding and agree with the plan and Code Status.  Code Status FULL CODE  Likely DC to  home  Condition GUARDED    Consults called:   none  Admission status: observation, may need inpatient  Time spent in minutes : 45   Pearson Grippe M.D on 12/15/2017 at 8:56 PM  Between 7am to 7pm - Pager - [phone]  . After 7pm go to [email or web address] - password [facility]  [facility] - Office  [phone]

## 2017-12-15 NOTE — ED Notes (Signed)
Patient desat to 86%, placed on 2 l/m [place] after HHN

## 2017-12-15 NOTE — ED Notes (Signed)
Unable to give report to the floor at this time

## 2017-12-15 NOTE — ED Triage Notes (Signed)
She was seen by her MD last week for a cold and URI. She is no better today. She was given an inhaler and used it today with no relief. Wheezing at triage.

## 2017-12-15 NOTE — Progress Notes (Signed)
57 yo female with c/o cough , wheezing and hypoxia,  Pox 80's per ED.  ED Dr. Gwyneth SproutWhitney Plunkett requesting admission for hypoxia/dyspnea.   D dimer added, and BNP added.  Will accept patient to tele bed

## 2017-12-16 ENCOUNTER — Observation Stay (HOSPITAL_BASED_OUTPATIENT_CLINIC_OR_DEPARTMENT_OTHER): Payer: Self-pay

## 2017-12-16 ENCOUNTER — Other Ambulatory Visit: Payer: Self-pay

## 2017-12-16 DIAGNOSIS — R0600 Dyspnea, unspecified: Secondary | ICD-10-CM

## 2017-12-16 LAB — COMPREHENSIVE METABOLIC PANEL
ALT: 47 U/L (ref 14–54)
ANION GAP: 8 (ref 5–15)
AST: 35 U/L (ref 15–41)
Albumin: 3.5 g/dL (ref 3.5–5.0)
Alkaline Phosphatase: 110 U/L (ref 38–126)
BILIRUBIN TOTAL: 0.4 mg/dL (ref 0.3–1.2)
BUN: 12 mg/dL (ref 6–20)
CHLORIDE: 104 mmol/L (ref 101–111)
CO2: 22 mmol/L (ref 22–32)
Calcium: 8.7 mg/dL — ABNORMAL LOW (ref 8.9–10.3)
Creatinine, Ser: 0.71 mg/dL (ref 0.44–1.00)
GFR calc Af Amer: >60 mL/min (ref >60)
GFR calc non Af Amer: >60 mL/min (ref >60)
Glucose, Bld: 319 mg/dL — ABNORMAL HIGH (ref 65–99)
POTASSIUM: 4 mmol/L (ref 3.5–5.1)
Sodium: 134 mmol/L — ABNORMAL LOW (ref 135–145)
TOTAL PROTEIN: 7.5 g/dL (ref 6.5–8.1)

## 2017-12-16 LAB — GLUCOSE, CAPILLARY
GLUCOSE-CAPILLARY: 324 mg/dL — AB (ref 65–99)
Glucose-Capillary: 290 mg/dL — ABNORMAL HIGH (ref 65–99)
Glucose-Capillary: 263 mg/dL — ABNORMAL HIGH (ref 65–99)
Glucose-Capillary: 362 mg/dL — ABNORMAL HIGH (ref 65–99)

## 2017-12-16 LAB — RESPIRATORY PANEL BY PCR
Adenovirus: NOT DETECTED
Bordetella pertussis: NOT DETECTED
CORONAVIRUS 229E-RVPPCR: NOT DETECTED
CORONAVIRUS HKU1-RVPPCR: NOT DETECTED
CORONAVIRUS NL63-RVPPCR: NOT DETECTED
CORONAVIRUS OC43-RVPPCR: NOT DETECTED
Chlamydophila pneumoniae: NOT DETECTED
Influenza A: NOT DETECTED
Influenza B: NOT DETECTED
METAPNEUMOVIRUS-RVPPCR: NOT DETECTED
Mycoplasma pneumoniae: NOT DETECTED
PARAINFLUENZA VIRUS 1-RVPPCR: NOT DETECTED
PARAINFLUENZA VIRUS 2-RVPPCR: NOT DETECTED
PARAINFLUENZA VIRUS 3-RVPPCR: NOT DETECTED
Parainfluenza Virus 4: NOT DETECTED
Respiratory Syncytial Virus: DETECTED — AB
Rhinovirus / Enterovirus: NOT DETECTED

## 2017-12-16 LAB — CBC
HEMATOCRIT: 44.9 % (ref 36.0–46.0)
HEMOGLOBIN: 15.3 g/dL — AB (ref 12.0–15.0)
MCH: 31.2 pg (ref 26.0–34.0)
MCHC: 34.1 g/dL (ref 30.0–36.0)
MCV: 91.6 fL (ref 78.0–100.0)
Platelets: 211 10*3/uL (ref 150–400)
RBC: 4.9 MIL/uL (ref 3.87–5.11)
RDW: 13.8 % (ref 11.5–15.5)
WBC: 9.4 10*3/uL (ref 4.0–10.5)

## 2017-12-16 LAB — HEMOGLOBIN A1C
HEMOGLOBIN A1C: 9.5 % — AB (ref 4.8–5.6)
MEAN PLASMA GLUCOSE: 225.95 mg/dL

## 2017-12-16 LAB — TSH: TSH: 0.281 u[IU]/mL — ABNORMAL LOW (ref 0.350–4.500)

## 2017-12-16 LAB — HIV ANTIBODY (ROUTINE TESTING W REFLEX): HIV SCREEN 4TH GENERATION: NONREACTIVE

## 2017-12-16 LAB — ECHOCARDIOGRAM COMPLETE
Height: 66 in
Weight: 3240 oz

## 2017-12-16 LAB — MRSA PCR SCREENING: MRSA BY PCR: NEGATIVE

## 2017-12-16 LAB — TROPONIN I: TROPONIN I: 0.03 ng/mL — AB (ref <0.03)

## 2017-12-16 MED ORDER — IPRATROPIUM-ALBUTEROL 0.5-2.5 (3) MG/3ML IN SOLN
3.0000 mL | Freq: Three times a day (TID) | RESPIRATORY_TRACT | Status: DC
Start: 2017-12-16 — End: 2017-12-18
  Administered 2017-12-16 – 2017-12-17 (×4): 3 mL via RESPIRATORY_TRACT
  Filled 2017-12-16 (×2): qty 3

## 2017-12-16 MED ORDER — IPRATROPIUM-ALBUTEROL 0.5-2.5 (3) MG/3ML IN SOLN: 3.0000 mL | Freq: Four times a day (QID) | RESPIRATORY_TRACT | Status: DC

## 2017-12-16 MED ORDER — INSULIN GLARGINE 100 UNIT/ML ~~LOC~~ SOLN
10.0000 [IU] | Freq: Every day | SUBCUTANEOUS | Status: DC
  Administered 2017-12-16: 10 [IU] via SUBCUTANEOUS
  Filled 2017-12-16: qty 0.1

## 2017-12-16 MED ORDER — GUAIFENESIN ER 600 MG PO TB12
600.0000 mg | ORAL_TABLET | Freq: Two times a day (BID) | ORAL | Status: DC
  Administered 2017-12-16 – 2017-12-18 (×5): 600 mg via ORAL
  Filled 2017-12-16 (×5): qty 1

## 2017-12-16 MED ORDER — AZITHROMYCIN 250 MG PO TABS
500.0000 mg | ORAL_TABLET | Freq: Every day | ORAL | Status: DC
  Administered 2017-12-16 – 2017-12-18 (×3): 500 mg via ORAL
  Filled 2017-12-16 (×3): qty 2

## 2017-12-16 MED ORDER — PNEUMOCOCCAL VAC POLYVALENT 25 MCG/0.5ML IJ INJ
0.5000 mL | INJECTION | INTRAMUSCULAR | Status: AC
  Administered 2017-12-17: 0.5 mL via INTRAMUSCULAR
  Filled 2017-12-16: qty 0.5

## 2017-12-16 MED ORDER — INFLUENZA VAC SPLIT QUAD 0.5 ML IM SUSY
0.5000 mL | PREFILLED_SYRINGE | INTRAMUSCULAR | Status: AC
  Administered 2017-12-17: 0.5 mL via INTRAMUSCULAR
  Filled 2017-12-16: qty 0.5

## 2017-12-16 MED ORDER — ALBUTEROL SULFATE (2.5 MG/3ML) 0.083% IN NEBU
2.5000 mg | INHALATION_SOLUTION | Freq: Four times a day (QID) | RESPIRATORY_TRACT | Status: DC
  Administered 2017-12-16: 2.5 mg via RESPIRATORY_TRACT
  Filled 2017-12-16 (×3): qty 3

## 2017-12-16 NOTE — Progress Notes (Signed)
CRITICAL VALUE ALERT  Critical Value:  resp virus panel positive for RSV.   Date & Time Notied:  12/16/16 @ 1600  Provider Notified: Dr Roda ShuttersXu  Orders Received/Actions taken: No nfew orders.

## 2017-12-16 NOTE — Progress Notes (Signed)
  Echocardiogram 2D Echocardiogram has been performed.  Bradleigh Sonnen L Androw 12/16/2017, 12:51 PM

## 2017-12-16 NOTE — Plan of Care (Signed)
  Clinical Measurements: Respiratory complications will improve 12/16/2017 2227 - Progressing by Herbert PunAddison, Alexsis Branscom Y, RN

## 2017-12-16 NOTE — Progress Notes (Signed)
Inpatient Diabetes Program Recommendations  AACE/ADA: New Consensus Statement on Inpatient Glycemic Control (2015)  Target Ranges:  Prepandial:   less than 140 mg/dL      Peak postprandial:   less than 180 mg/dL (1-2 hours)      Critically ill patients:  140 - 180 mg/dL   Lab Results  Component Value Date   GLUCAP 263 (H) 12/16/2017   HGBA1C 9.5 (H) 12/16/2017    Review of Glycemic Control  Diabetes history: DM2 Outpatient Diabetes medications: NPH 27 units bid, Novolog 5 units if BS > 250 mg/dL, glipizide 10 mg QD Current orders for Inpatient glycemic control: Lantus 10 units QHS, Novolog 0-9 units tidwc and hs  On Solumedrol 80 Q8H. HgbA1C - 9.5% - uncontrolled   Inpatient Diabetes Program Recommendations:     Increase Lantus to 20 units QHS Add Novolog 4 units tidwc for meal coverage insulin while on high dose steroids.  Will need to f/u with PCP for diabetes management.  Continue to follow while inpatient.  Thank you. Ailene Ardshonda Franki Stemen, RD, LDN, CDE Inpatient Diabetes Coordinator [phone]

## 2017-12-16 NOTE — Progress Notes (Signed)
PROGRESS NOTE  Jill Barnes:[id number] DOB: [birth year] DOA: 12/15/2017 P[facility]: [facility] [place]  HPI/Recap of past 24 hours:  Cough and wheezing No fever, denies chest pain, no edema  Assessment/Plan: Principal Problem:   Hypoxia Active Problems:   COPD exacerbation (HCC)   Tachycardia   Nicotine dependence  Acute hypoxic respiratory failure from copd exacerbation and RSV -her oxygen saturations in the mid 80s requiring 2 L of oxygen to remain greater than 90% initially on presentation -she reports symptom started a week ago, she was give doxycycline with no improvement -cxr on presentation no acute findings -continue iv steroids/nebs/mucinex. Change albuterol nebs to duonebs -supportive care,   Hypokalemia  replaced, check mag  Insulin dependent dm2 a1c 9.5 Report h/o metformin intolerance. Expect elevated blood glucose with steroids Adjust insulin   Smoker: Smoking cessation education provided, nicotine patch Mild erythrocytosis likely from chronic smoking  Body mass index is 32.68 kg/m.  Code Status: full  Family Communication: patient and son at bedside  Disposition Plan: Home in 24-48 hours   Consultants:  None  Procedures:  none  Antibiotics:  Zithromax   Objective: BP 139/71 (BP Location: Right Arm)   Pulse (!) 105   Temp 97.8 F (36.6 C) (Oral)   Resp 20   Ht 5\' 6"  (1.676 m)   Wt 91.9 kg (202 lb 8 oz)   SpO2 92%   BMI 32.68 kg/m   Intake/Output Summary (Last 24 hours) at 12/16/2017 0817 Last data filed at 12/16/2017 0600 Gross per 24 hour  Intake 2360 ml  Output 2150 ml  Net 210 ml   Filed Weights   12/15/17 1455 12/15/17 2045 12/16/17 0614  Weight: 91.6 kg (202 lb) 92.8 kg (204 lb 9.4 oz) 91.9 kg (202 lb 8 oz)    Exam: Patient is examined daily including today on 12/16/2017, exams remain the same as of yesterday except that has changed    General:  Does not look comfortable,  coughing  Cardiovascular: tachycardia has imprved  Respiratory: bilateral crackles   Abdomen: Soft/ND/NT, positive BS  Musculoskeletal: No Edema  Neuro: alert, oriented   Data Reviewed: Basic Metabolic Panel: Recent Labs  Lab 12/15/17 1618 12/15/17 2250 12/16/17 0506  NA 133*  --  134*  K 3.0*  --  4.0  CL 102  --  104  CO2 22  --  22  GLUCOSE 277* 384* 319*  BUN 13  --  12  CREATININE 0.75  --  0.71  CALCIUM 8.3*  --  8.7*   Liver Function Tests: Recent Labs  Lab 12/16/17 0506  AST 35  ALT 47  ALKPHOS 110  BILITOT 0.4  PROT 7.5  ALBUMIN 3.5   No results for input(s): LIPASE, AMYLASE in the last 168 hours. No results for input(s): AMMONIA in the last 168 hours. CBC: Recent Labs  Lab 12/15/17 1618 12/16/17 0506  WBC 6.2 9.4  NEUTROABS 3.7  --   HGB 15.4* 15.3*  HCT 45.3 44.9  MCV 91.5 91.6  PLT 168 211   Cardiac Enzymes:   Recent Labs  Lab 12/15/17 1620 12/16/17 0506  TROPONINI <0.03 0.03*   BNP (last 3 results) Recent Labs    12/15/17 1618  BNP 12.9    ProBNP (last 3 results) No results for input(s): PROBNP in the last 8760 hours.  CBG: Recent Labs  Lab 12/15/17 2212 12/16/17 0740  GLUCAP 405* 324*    No results found for this or any previous  visit (from the past 240 hour(s)).   Studies: Dg Chest 2 View  Result Date: 12/15/2017 CLINICAL DATA:  Cough and shortness of breath. EXAM: CHEST  2 VIEW COMPARISON:  02/25/2017 FINDINGS: Lung markings are slightly prominent particularly in the left lower chest. However, these findings are not significantly changed and are suggestive for chronic changes. There is no focal airspace disease or lung consolidation. Heart and mediastinum are within normal limits. Trachea is midline. No large pleural effusions. No acute bone abnormality. IMPRESSION: No active cardiopulmonary disease. Electronically Signed   By: Richarda OverlieAdam  Henn M.D.   On: 12/15/2017 15:31   Ct Angio Chest Pe W Or Wo Contrast  Result  Date: 12/15/2017 CLINICAL DATA:  Dyspnea cough EXAM: CT ANGIOGRAPHY CHEST WITH CONTRAST TECHNIQUE: Multidetector CT imaging of the chest was performed using the standard protocol during bolus administration of intravenous contrast. Multiplanar CT image reconstructions and MIPs were obtained to evaluate the vascular anatomy. CONTRAST:  100 cc Isovue 370 intravenous COMPARISON:  Radiograph 12/15/2017 FINDINGS: Cardiovascular: Satisfactory opacification of the pulmonary arteries to the segmental level. No evidence of pulmonary embolism. Nonaneurysmal aorta. No dissection is seen. Coronary artery calcification. Normal heart size. No pericardial effusion. Mediastinum/Nodes: Midline trachea. No thyroid mass. Mild mediastinal lymph nodes, for example precarinal lymph node measures 14 mm, series 4, image number 34. Subcarinal lymph node measures 12 mm, series 4, image number 41. Small hilar lymph nodes, measuring up to 11 mm on the right and 9 mm on the left esophagus within normal limits. Lungs/Pleura: No pleural effusion or pneumothorax. Minimal apical emphysema. Mild centrilobular nodularity in the right upper lobe. Upper Abdomen: No acute abnormality. Musculoskeletal: No chest wall abnormality. No acute or significant osseous findings. Review of the MIP images confirms the above findings. IMPRESSION: 1. Negative for acute pulmonary embolus or aortic dissection 2. Mild apical emphysema. Mild nodularity in the right upper lobe suggesting bronchiolitis. 3. Mild mediastinal lymph nodes, likely reactive. Emphysema (ICD10-J43.9). Electronically Signed   By: Jasmine PangKim  Fujinaga M.D.   On: 12/15/2017 22:58    Scheduled Meds: . enoxaparin (LOVENOX) injection  40 mg Subcutaneous Q24H  . guaiFENesin  600 mg Oral BID  . [START ON 12/17/2017] Influenza vac split quadrivalent PF  0.5 mL Intramuscular Tomorrow-1000  . insulin aspart  0-5 Units Subcutaneous QHS  . insulin aspart  0-9 Units Subcutaneous TID WC  . iopamidol      .  ipratropium-albuterol  3 mL Nebulization Q6H  . methylPREDNISolone (SOLU-MEDROL) injection  80 mg Intravenous Q8H  . nicotine  21 mg Transdermal Daily  . [START ON 12/17/2017] pneumococcal 23 valent vaccine  0.5 mL Intramuscular Tomorrow-1000  . tiotropium  18 mcg Inhalation Daily    Continuous Infusions: . azithromycin Stopped (12/16/17 0432)     Time spent: 35mins I have personally reviewed and interpreted on  12/16/2017 daily labs, tele strips, imagings as discussed above under date review session and assessment and plans.  I reviewed all nursing notes, pharmacy notes, consultant notes,  vitals, pertinent old records  I have discussed plan of care as described above with RN , patient and family on 12/16/2017   Albertine GratesFang Annaleese Guier MD, PhD  [facility] Pager [phone]. If 7PM-7AM, please contact night-coverage at [email or web address], password [email or web address] 12/16/2017, 8:17 AM  LOS: 0 days

## 2017-12-17 LAB — CBC WITH DIFFERENTIAL/PLATELET
BASOS ABS: 0 10*3/uL (ref 0.0–0.1)
BASOS PCT: 0 %
Eosinophils Absolute: 0 10*3/uL (ref 0.0–0.7)
Eosinophils Relative: 0 %
HEMATOCRIT: 45.4 % (ref 36.0–46.0)
Hemoglobin: 15.3 g/dL — ABNORMAL HIGH (ref 12.0–15.0)
Lymphocytes Relative: 7 %
Lymphs Abs: 1.1 10*3/uL (ref 0.7–4.0)
MCH: 31 pg (ref 26.0–34.0)
MCHC: 33.7 g/dL (ref 30.0–36.0)
MCV: 91.9 fL (ref 78.0–100.0)
MONO ABS: 0.8 10*3/uL (ref 0.1–1.0)
Monocytes Relative: 5 %
NEUTROS ABS: 14 10*3/uL — AB (ref 1.7–7.7)
Neutrophils Relative %: 88 %
Platelets: 223 10*3/uL (ref 150–400)
RBC: 4.94 MIL/uL (ref 3.87–5.11)
RDW: 13.6 % (ref 11.5–15.5)
WBC: 16 10*3/uL — ABNORMAL HIGH (ref 4.0–10.5)

## 2017-12-17 LAB — GLUCOSE, CAPILLARY
GLUCOSE-CAPILLARY: 303 mg/dL — AB (ref 65–99)
GLUCOSE-CAPILLARY: 345 mg/dL — AB (ref 65–99)
Glucose-Capillary: 326 mg/dL — ABNORMAL HIGH (ref 65–99)
Glucose-Capillary: 343 mg/dL — ABNORMAL HIGH (ref 65–99)

## 2017-12-17 LAB — BASIC METABOLIC PANEL
ANION GAP: 6 (ref 5–15)
BUN: 21 mg/dL — ABNORMAL HIGH (ref 6–20)
CHLORIDE: 99 mmol/L — AB (ref 101–111)
CO2: 25 mmol/L (ref 22–32)
Calcium: 8.5 mg/dL — ABNORMAL LOW (ref 8.9–10.3)
Creatinine, Ser: 0.76 mg/dL (ref 0.44–1.00)
GFR calc Af Amer: >60 mL/min (ref >60)
Glucose, Bld: 342 mg/dL — ABNORMAL HIGH (ref 65–99)
Potassium: 3.8 mmol/L (ref 3.5–5.1)
SODIUM: 130 mmol/L — AB (ref 135–145)

## 2017-12-17 LAB — T4, FREE: FREE T4: 1 ng/dL (ref 0.61–1.12)

## 2017-12-17 LAB — MAGNESIUM: MAGNESIUM: 2.1 mg/dL (ref 1.7–2.4)

## 2017-12-17 MED ORDER — METHYLPREDNISOLONE SODIUM SUCC 125 MG IJ SOLR
80.0000 mg | Freq: Two times a day (BID) | INTRAMUSCULAR | Status: DC
  Administered 2017-12-17 – 2017-12-18 (×2): 80 mg via INTRAVENOUS
  Filled 2017-12-17 (×2): qty 2

## 2017-12-17 MED ORDER — INSULIN ASPART 100 UNIT/ML ~~LOC~~ SOLN
4.0000 [IU] | Freq: Three times a day (TID) | SUBCUTANEOUS | Status: DC
  Administered 2017-12-17 – 2017-12-18 (×3): 4 [IU] via SUBCUTANEOUS

## 2017-12-17 MED ORDER — SODIUM CHLORIDE 0.9 % IV SOLN
INTRAVENOUS | Status: AC
  Administered 2017-12-17 – 2017-12-18 (×2): via INTRAVENOUS

## 2017-12-17 MED ORDER — BENZONATATE 100 MG PO CAPS
100.0000 mg | ORAL_CAPSULE | Freq: Three times a day (TID) | ORAL | Status: DC | PRN
Start: 2017-12-17 — End: 2017-12-18
  Administered 2017-12-17 (×3): 100 mg via ORAL
  Filled 2017-12-17 (×3): qty 1

## 2017-12-17 MED ORDER — INSULIN GLARGINE 100 UNIT/ML ~~LOC~~ SOLN
20.0000 [IU] | Freq: Every day | SUBCUTANEOUS | Status: DC
  Administered 2017-12-17: 20 [IU] via SUBCUTANEOUS
  Filled 2017-12-17: qty 0.2

## 2017-12-17 NOTE — Progress Notes (Signed)
PROGRESS NOTE  Jill OfficerDiane R Barnes XBJ:[id number] DOB: 01/28/1961 DOA: 12/15/2017 PCP: Jill Barnes, Jill P, DO  HPI/Recap of past 24 hours:  Continue to Cough and have wheezing, reports rib pain from the cough  Some loose stool, " I have loose stool when I gets insulin"  No fever,  no edema  Assessment/Plan: Principal Problem:   Hypoxia Active Problems:   COPD exacerbation (HCC)   Tachycardia   Nicotine dependence  Acute hypoxic respiratory failure from copd exacerbation and RSV -her oxygen saturations in the mid 80s requiring 2 L of oxygen to remain greater than 90% initially on presentation -she reports symptom started a week ago, she was give doxycycline with no improvement -CTA chest " 1. Negative for acute pulmonary embolus or aortic dissection, 2. Mild apical emphysema. Mild nodularity in the right upper lobe suggesting bronchiolitis. 3. Mild mediastinal lymph nodes, likely reactive." echocardiogram unremarkable. -continue iv steroids/nebs/mucinex. Change albuterol nebs to duonebs -supportive care, remain significant cough, sinus tachycardia has resolved, d/c tele, she appear dry,  start hydration  Hypokalemia  replaced,  Mag 2.1  Insulin dependent dm2 a1c 9.5 Report h/o metformin intolerance. Expect elevated blood glucose with steroids Continue Adjust insulin, diabetes RN input appreciated.   Smoker: Smoking cessation education provided, nicotine patch Mild erythrocytosis likely from chronic smoking  Body mass index is 31.99 kg/m.  Code Status: full  Family Communication: patient   Disposition Plan: Home in 24-48 hours   Consultants:  None  Procedures:  none  Antibiotics:  Zithromax   Objective: BP 138/84 (BP Location: Left Arm)   Pulse 81   Temp 97.9 F (36.6 C) (Oral)   Resp 20   Ht 5\' 6"  (1.676 m)   Wt 89.9 kg (198 lb 3.2 oz)   SpO2 93%   BMI 31.99 kg/m   Intake/Output Summary (Last 24 hours) at 12/17/2017 1132 Last data filed at 12/17/2017  [id number] Gross per 24 hour  Intake 840 ml  Output 1000 ml  Net -160 ml   Filed Weights   12/15/17 2045 12/16/17 0614 12/17/17 0500  Weight: 92.8 kg (204 lb 9.4 oz) 91.9 kg (202 lb 8 oz) 89.9 kg (198 lb 3.2 oz)    Exam: Patient is examined daily including today on 12/17/2017, exams remain the same as of yesterday except that has changed    General:  Does not look comfortable, coughing  Cardiovascular: tachycardia has resolved, now NSR  Respiratory: bilateral crackles, course breath sounds   Abdomen: Soft/ND/NT, positive BS  Musculoskeletal: No Edema  Neuro: alert, oriented   Data Reviewed: Basic Metabolic Panel: Recent Labs  Lab 12/15/17 1618 12/15/17 2250 12/16/17 0506 12/17/17 0525  NA 133*  --  134* 130*  K 3.0*  --  4.0 3.8  CL 102  --  104 99*  CO2 22  --  22 25  GLUCOSE 277* 384* 319* 342*  BUN 13  --  12 21*  CREATININE 0.75  --  0.71 0.76  CALCIUM 8.3*  --  8.7* 8.5*  MG  --   --   --  2.1   Liver Function Tests: Recent Labs  Lab 12/16/17 0506  AST 35  ALT 47  ALKPHOS 110  BILITOT 0.4  PROT 7.5  ALBUMIN 3.5   No results for input(s): LIPASE, AMYLASE in the last 168 hours. No results for input(s): AMMONIA in the last 168 hours. CBC: Recent Labs  Lab 12/15/17 1618 12/16/17 0506 12/17/17 0525  WBC 6.2 9.4 16.0*  NEUTROABS 3.7  --  14.0*  HGB 15.4* 15.3* 15.3*  HCT 45.3 44.9 45.4  MCV 91.5 91.6 91.9  PLT 168 211 223   Cardiac Enzymes:   Recent Labs  Lab 12/15/17 1620 12/16/17 0506  TROPONINI <0.03 0.03*   BNP (last 3 results) Recent Labs    12/15/17 1618  BNP 12.9    ProBNP (last 3 results) No results for input(s): PROBNP in the last 8760 hours.  CBG: Recent Labs  Lab 12/16/17 0740 12/16/17 1222 12/16/17 1615 12/16/17 2213 12/17/17 0722  GLUCAP 324* 290* 263* 362* 303*    Recent Results (from the past 240 hour(s))  Respiratory Panel by PCR     Status: Abnormal   Collection Time: 12/16/17 10:37 AM  Result Value Ref  Range Status   Adenovirus NOT DETECTED NOT DETECTED Final   Coronavirus 229E NOT DETECTED NOT DETECTED Final   Coronavirus HKU1 NOT DETECTED NOT DETECTED Final   Coronavirus NL63 NOT DETECTED NOT DETECTED Final   Coronavirus OC43 NOT DETECTED NOT DETECTED Final   Metapneumovirus NOT DETECTED NOT DETECTED Final   Rhinovirus / Enterovirus NOT DETECTED NOT DETECTED Final   Influenza A NOT DETECTED NOT DETECTED Final   Influenza B NOT DETECTED NOT DETECTED Final   Parainfluenza Virus 1 NOT DETECTED NOT DETECTED Final   Parainfluenza Virus 2 NOT DETECTED NOT DETECTED Final   Parainfluenza Virus 3 NOT DETECTED NOT DETECTED Final   Parainfluenza Virus 4 NOT DETECTED NOT DETECTED Final   Respiratory Syncytial Virus DETECTED (A) NOT DETECTED Final    Comment: CRITICAL RESULT CALLED TO, READ BACK BY AND VERIFIED WITH: Meredith Mody RN 16:00 12/16/17 ([email or web address])    Bordetella pertussis NOT DETECTED NOT DETECTED Final   Chlamydophila pneumoniae NOT DETECTED NOT DETECTED Final   Mycoplasma pneumoniae NOT DETECTED NOT DETECTED Final  MRSA PCR Screening     Status: None   Collection Time: 12/16/17 10:37 AM  Result Value Ref Range Status   MRSA by PCR NEGATIVE NEGATIVE Final    Comment:        The GeneXpert MRSA Assay (FDA approved for NASAL specimens only), is one component of a comprehensive MRSA colonization surveillance program. It is not intended to diagnose MRSA infection nor to guide or monitor treatment for MRSA infections.      Studies: No results found.  Scheduled Meds: . azithromycin  500 mg Oral Daily  . enoxaparin (LOVENOX) injection  40 mg Subcutaneous Q24H  . guaiFENesin  600 mg Oral BID  . insulin aspart  0-5 Units Subcutaneous QHS  . insulin aspart  0-9 Units Subcutaneous TID WC  . insulin aspart  4 Units Subcutaneous TID WC  . insulin glargine  20 Units Subcutaneous QHS  . ipratropium-albuterol  3 mL Nebulization TID  . methylPREDNISolone (SOLU-MEDROL) injection  80 mg  Intravenous Q12H  . nicotine  21 mg Transdermal Daily    Continuous Infusions: . sodium chloride       Time spent: I have personally reviewed and interpreted on  12/17/2017 daily labs, tele strips, imagings as discussed above under date review session and assessment and plans.  I reviewed all nursing notes,   vitals, pertinent old records  I have discussed plan of care as described above with RN , patient on 12/17/2017   Albertine Grates MD, PhD  [facility] Pager [phone]. If 7PM-7AM, please contact night-coverage at [email or web address], password [facility] 12/17/2017, 11:32 AM  LOS: 1 day

## 2017-12-17 NOTE — Plan of Care (Signed)
  Clinical Measurements: Respiratory complications will improve 12/17/2017 2155 - Progressing by Herbert PunAddison, Efton Thomley Y, RN   Safety: Ability to remain free from injury will improve 12/17/2017 2155 - Progressing by Jacory Kamel, Dwana Melenahloe Y, RN

## 2017-12-18 LAB — CBC WITH DIFFERENTIAL/PLATELET
BASOS ABS: 0 10*3/uL (ref 0.0–0.1)
Basophils Relative: 0 %
Eosinophils Absolute: 0 10*3/uL (ref 0.0–0.7)
Eosinophils Relative: 0 %
HEMATOCRIT: 46.1 % — AB (ref 36.0–46.0)
HEMOGLOBIN: 15.7 g/dL — AB (ref 12.0–15.0)
LYMPHS PCT: 20 %
Lymphs Abs: 2.5 10*3/uL (ref 0.7–4.0)
MCH: 31.2 pg (ref 26.0–34.0)
MCHC: 34.1 g/dL (ref 30.0–36.0)
MCV: 91.5 fL (ref 78.0–100.0)
MONO ABS: 0.8 10*3/uL (ref 0.1–1.0)
Monocytes Relative: 6 %
NEUTROS PCT: 74 %
Neutro Abs: 9.4 10*3/uL — ABNORMAL HIGH (ref 1.7–7.7)
Platelets: 238 10*3/uL (ref 150–400)
RBC: 5.04 MIL/uL (ref 3.87–5.11)
RDW: 13.5 % (ref 11.5–15.5)
WBC: 12.7 10*3/uL — ABNORMAL HIGH (ref 4.0–10.5)

## 2017-12-18 LAB — BASIC METABOLIC PANEL
ANION GAP: 7 (ref 5–15)
BUN: 24 mg/dL — ABNORMAL HIGH (ref 6–20)
CALCIUM: 9 mg/dL (ref 8.9–10.3)
CHLORIDE: 103 mmol/L (ref 101–111)
CO2: 27 mmol/L (ref 22–32)
Creatinine, Ser: 0.73 mg/dL (ref 0.44–1.00)
GFR calc Af Amer: >60 mL/min (ref >60)
GFR calc non Af Amer: >60 mL/min (ref >60)
Glucose, Bld: 318 mg/dL — ABNORMAL HIGH (ref 65–99)
Potassium: 3.9 mmol/L (ref 3.5–5.1)
SODIUM: 137 mmol/L (ref 135–145)

## 2017-12-18 LAB — GLUCOSE, CAPILLARY: GLUCOSE-CAPILLARY: 239 mg/dL — AB (ref 65–99)

## 2017-12-18 MED ORDER — GUAIFENESIN ER 600 MG PO TB12: 600.0000 mg | ORAL_TABLET | Freq: Two times a day (BID) | ORAL | 0 refills | Status: DC

## 2017-12-18 MED ORDER — ALBUTEROL SULFATE HFA 108 (90 BASE) MCG/ACT IN AERS: 2.0000 | INHALATION_SPRAY | Freq: Four times a day (QID) | RESPIRATORY_TRACT | 2 refills | Status: DC | PRN

## 2017-12-18 MED ORDER — IPRATROPIUM-ALBUTEROL 0.5-2.5 (3) MG/3ML IN SOLN: 3.0000 mL | Freq: Two times a day (BID) | RESPIRATORY_TRACT | Status: DC

## 2017-12-18 MED ORDER — DOXYCYCLINE HYCLATE 100 MG PO CAPS: 100.0000 mg | ORAL_CAPSULE | Freq: Two times a day (BID) | ORAL | 0 refills | Status: AC

## 2017-12-18 MED ORDER — BENZONATATE 100 MG PO CAPS: 100.0000 mg | ORAL_CAPSULE | Freq: Three times a day (TID) | ORAL | 0 refills | Status: DC | PRN

## 2017-12-18 MED ORDER — PREDNISONE 10 MG PO TABS: ORAL_TABLET | ORAL | 0 refills | Status: DC

## 2017-12-18 NOTE — Discharge Summary (Signed)
Discharge Summary  Delma OfficerDiane R Guise UJW:[id number] DOB: 1961-07-29  PCP: Lenell AntuLe, Thao P, DO  Admit date: 12/15/2017 Discharge date: 12/18/2017  Time spent: <4030mins  Recommendations for Outpatient Follow-up:  1. F/u with PMD within a week  for hospital discharge follow up, repeat cbc/bmp at follow up  Discharge Diagnoses:  Active Hospital Problems   Diagnosis Date Noted  . Hypoxia 12/15/2017  . COPD exacerbation (HCC) 12/15/2017  . Tachycardia 12/15/2017  . Nicotine dependence 12/15/2017    Resolved Hospital Problems  No resolved problems to display.    Discharge Condition: stable  Diet recommendation: carb modified  Filed Weights   12/17/17 0500 12/18/17 0500 12/18/17 0700  Weight: 89.9 kg (198 lb 3.2 oz) 90.4 kg (199 lb 4.7 oz) 90.5 kg (199 lb 8.3 oz)    History of present illness: (per admitting MD Dr Selena BattenKim) Suzanna Obeyiane Herberger  is a 57 y.o. female, w nicotine dependence , nocturnal hypoxia (?undiagnosed Osa) apparently c/o dyspnea for the past 1+ week for which she had received abx.  Pt apparently had worsening dyspnea and cough earlier today. Subjective fever, and cough (dry), pt denies cp, palp , n/v, abd pain, diarrhea, brbpr, black stool, dysuria.  [facility]  [facility].   In Ed, pt was noted to be hypoxic. Pox 80's  CXR IMPRESSION: No active cardiopulmonary disease.  Wbc 6.2, hg 15.4, Plt 168 Na 133, K 3.0, Glucose 277 Bun 13, Creatinine 0.75  D dimer 0.69 BNP 12.9  Trop negative,    Pt will be admitted for hypoxia secondary to Copd exacerbation, and tachycardia r/o PE>     Hospital Course:  Principal Problem:   Hypoxia Active Problems:   COPD exacerbation (HCC)   Tachycardia   Nicotine dependence   Acute hypoxic respiratory failure from copd exacerbation and RSV -she has tachycardia and her oxygen saturations in the mid 80s requiring 2 L of oxygen to remain greater than 90% initially on presentation -she reports symptom started a  week ago, she was give doxycycline with no improvement -CTA chest " 1. Negative for acute pulmonary embolus or aortic dissection, 2. Mild apical emphysema. Mild nodularity in the right upper lobe suggesting bronchiolitis. 3. Mild mediastinal lymph nodes, likely reactive." echocardiogram unremarkable. -she received  iv steroids/nebs/mucinex/zithromax. she has improved, she is now on room air, tachycardia has resolved, less cough,  -she is discharged home with steroids taper and abx to finish treatment course.   Hypokalemia  replaced,  Mag 2.1  Insulin dependent dm2 a1c 9.5 Report h/o metformin intolerance. Expect elevated blood glucose with steroids Continue Adjust insulin, diabetes RN input appreciated.   Smoker: Smoking cessation education provided, nicotine patch Mild erythrocytosis likely from chronic smoking  Body mass index is 31.99 kg/m.  Code Status: full  Family Communication: patient   Disposition Plan: Home    Consultants:  None  Procedures:  none  Antibiotics:  Zithromax    Discharge Exam: BP (!) 158/77 (BP Location: Right Arm)   Pulse 72   Temp 97.8 F (36.6 C) (Oral)   Resp 18   Ht 5\' 6"  (1.676 m)   Wt 90.5 kg (199 lb 8.3 oz)   SpO2 93%   BMI 32.20 kg/m   General: NAD Cardiovascular: RRR Respiratory: a few crackles, no wheezing  Discharge Instructions You were cared for by a hospitalist during your hospital stay. If you have any questions about your discharge medications or the care you received while you were in the hospital after you  are discharged, you can call the unit and asked to speak with the hospitalist on call if the hospitalist that took care of you is not available. Once you are discharged, your primary care physician will handle any further medical issues. Please note that NO REFILLS for any discharge medications will be authorized once you are discharged, as it is imperative that you return to your primary care  physician (or establish a relationship with a primary care physician if you do not have one) for your aftercare needs so that they can reassess your need for medications and monitor your lab values.  Discharge Instructions    Diet Carb Modified   Complete by:  As directed    Increase activity slowly   Complete by:  As directed      Allergies as of 12/18/2017      Reactions   Sulfa Antibiotics Swelling      Medication List    TAKE these medications   albuterol 108 (90 Base) MCG/ACT inhaler Commonly known as:  PROVENTIL HFA;VENTOLIN HFA Inhale 2 puffs into the lungs every 6 (six) hours as needed for wheezing or shortness of breath.   benzonatate 100 MG capsule Commonly known as:  TESSALON Take 1 capsule (100 mg total) by mouth 3 (three) times daily as needed for cough.   doxycycline 100 MG capsule Commonly known as:  VIBRAMYCIN Take 1 capsule (100 mg total) by mouth 2 (two) times daily for 6 days.   glipiZIDE 10 MG 24 hr tablet Commonly known as:  GLUCOTROL XL Take 10 mg by mouth daily with breakfast.   guaiFENesin 600 MG 12 hr tablet Commonly known as:  MUCINEX Take 1 tablet (600 mg total) by mouth 2 (two) times daily.   insulin aspart 100 UNIT/ML injection Commonly known as:  novoLOG Inject 5 Units into the skin once. If BS over 250   insulin NPH Human 100 UNIT/ML injection Commonly known as:  HUMULIN N,NOVOLIN N Inject 27 Units into the skin 2 (two) times daily before a meal.   predniSONE 10 MG tablet Commonly known as:  DELTASONE Label  & dispense according to the schedule below. 6 Pills PO on day one then, 5 Pills PO on day two, 4 Pills PO on day three, 3Pills PO on day four, 2 Pills PO on day five, 1 Pills PO on day six,  then STOP.  Total of 21 tabs   tamsulosin 0.4 MG Caps capsule Commonly known as:  FLOMAX Take 1 capsule (0.4 mg total) by mouth daily.      Allergies  Allergen Reactions  . Sulfa Antibiotics Swelling   Follow-up Information    Le, Thao  P, DO Follow up in 1 week(s).   Specialty:  Family Medicine Why:  hospital discharge follow up please consider stop smoking. please check your glucose level three times a day and bring in records for your doctor to review.  Contact information: [address] [place] [address] [phone]            The results of significant diagnostics from this hospitalization (including imaging, microbiology, ancillary and laboratory) are listed below for reference.    Significant Diagnostic Studies: Dg Chest 2 View  Result Date: 12/15/2017 CLINICAL DATA:  Cough and shortness of breath. EXAM: CHEST  2 VIEW COMPARISON:  02/25/2017 FINDINGS: Lung markings are slightly prominent particularly in the left lower chest. However, these findings are not significantly changed and are suggestive for chronic changes. There is no  focal airspace disease or lung consolidation. Heart and mediastinum are within normal limits. Trachea is midline. No large pleural effusions. No acute bone abnormality. IMPRESSION: No active cardiopulmonary disease. Electronically Signed   By: Richarda Overlie M.D.   On: 12/15/2017 15:31   Ct Angio Chest Pe W Or Wo Contrast  Result Date: 12/15/2017 CLINICAL DATA:  Dyspnea cough EXAM: CT ANGIOGRAPHY CHEST WITH CONTRAST TECHNIQUE: Multidetector CT imaging of the chest was performed using the standard protocol during bolus administration of intravenous contrast. Multiplanar CT image reconstructions and MIPs were obtained to evaluate the vascular anatomy. CONTRAST:  100 cc Isovue 370 intravenous COMPARISON:  Radiograph 12/15/2017 FINDINGS: Cardiovascular: Satisfactory opacification of the pulmonary arteries to the segmental level. No evidence of pulmonary embolism. Nonaneurysmal aorta. No dissection is seen. Coronary artery calcification. Normal heart size. No pericardial effusion. Mediastinum/Nodes: Midline trachea. No thyroid mass. Mild mediastinal lymph nodes, for example precarinal lymph node  measures 14 mm, series 4, image number 34. Subcarinal lymph node measures 12 mm, series 4, image number 41. Small hilar lymph nodes, measuring up to 11 mm on the right and 9 mm on the left esophagus within normal limits. Lungs/Pleura: No pleural effusion or pneumothorax. Minimal apical emphysema. Mild centrilobular nodularity in the right upper lobe. Upper Abdomen: No acute abnormality. Musculoskeletal: No chest wall abnormality. No acute or significant osseous findings. Review of the MIP images confirms the above findings. IMPRESSION: 1. Negative for acute pulmonary embolus or aortic dissection 2. Mild apical emphysema. Mild nodularity in the right upper lobe suggesting bronchiolitis. 3. Mild mediastinal lymph nodes, likely reactive. Emphysema (ICD10-J43.9). Electronically Signed   By: Jasmine Pang M.D.   On: 12/15/2017 22:58    Microbiology: Recent Results (from the past 240 hour(s))  Respiratory Panel by PCR     Status: Abnormal   Collection Time: 12/16/17 10:37 AM  Result Value Ref Range Status   Adenovirus NOT DETECTED NOT DETECTED Final   Coronavirus 229E NOT DETECTED NOT DETECTED Final   Coronavirus HKU1 NOT DETECTED NOT DETECTED Final   Coronavirus NL63 NOT DETECTED NOT DETECTED Final   Coronavirus OC43 NOT DETECTED NOT DETECTED Final   Metapneumovirus NOT DETECTED NOT DETECTED Final   Rhinovirus / Enterovirus NOT DETECTED NOT DETECTED Final   Influenza A NOT DETECTED NOT DETECTED Final   Influenza B NOT DETECTED NOT DETECTED Final   Parainfluenza Virus 1 NOT DETECTED NOT DETECTED Final   Parainfluenza Virus 2 NOT DETECTED NOT DETECTED Final   Parainfluenza Virus 3 NOT DETECTED NOT DETECTED Final   Parainfluenza Virus 4 NOT DETECTED NOT DETECTED Final   Respiratory Syncytial Virus DETECTED (A) NOT DETECTED Final    Comment: CRITICAL RESULT CALLED TO, READ BACK BY AND VERIFIED WITH: Meredith Mody RN 16:00 12/16/17 ([email or web address])    Bordetella pertussis NOT DETECTED NOT DETECTED Final    Chlamydophila pneumoniae NOT DETECTED NOT DETECTED Final   Mycoplasma pneumoniae NOT DETECTED NOT DETECTED Final  MRSA PCR Screening     Status: None   Collection Time: 12/16/17 10:37 AM  Result Value Ref Range Status   MRSA by PCR NEGATIVE NEGATIVE Final    Comment:        The GeneXpert MRSA Assay (FDA approved for NASAL specimens only), is one component of a comprehensive MRSA colonization surveillance program. It is not intended to diagnose MRSA infection nor to guide or monitor treatment for MRSA infections.      Labs: Basic Metabolic Panel: Recent Labs  Lab 12/15/17 1618 12/15/17 2250 12/16/17 1610  12/17/17 0525 12/18/17 0536  NA 133*  --  134* 130* 137  K 3.0*  --  4.0 3.8 3.9  CL 102  --  104 99* 103  CO2 22  --  22 25 27   GLUCOSE 277* 384* 319* 342* 318*  BUN 13  --  12 21* 24*  CREATININE 0.75  --  0.71 0.76 0.73  CALCIUM 8.3*  --  8.7* 8.5* 9.0  MG  --   --   --  2.1  --    Liver Function Tests: Recent Labs  Lab 12/16/17 0506  AST 35  ALT 47  ALKPHOS 110  BILITOT 0.4  PROT 7.5  ALBUMIN 3.5   No results for input(s): LIPASE, AMYLASE in the last 168 hours. No results for input(s): AMMONIA in the last 168 hours. CBC: Recent Labs  Lab 12/15/17 1618 12/16/17 0506 12/17/17 0525 12/18/17 0536  WBC 6.2 9.4 16.0* 12.7*  NEUTROABS 3.7  --  14.0* 9.4*  HGB 15.4* 15.3* 15.3* 15.7*  HCT 45.3 44.9 45.4 46.1*  MCV 91.5 91.6 91.9 91.5  PLT 168 211 223 238   Cardiac Enzymes: Recent Labs  Lab 12/15/17 1620 12/16/17 0506  TROPONINI <0.03 0.03*   BNP: BNP (last 3 results) Recent Labs    12/15/17 1618  BNP 12.9    ProBNP (last 3 results) No results for input(s): PROBNP in the last 8760 hours.  CBG: Recent Labs  Lab 12/17/17 0722 12/17/17 1136 12/17/17 1652 12/17/17 2040 12/18/17 0750  GLUCAP 303* 345* 326* 343* 239*       Signed:  Albertine Grates MD, PhD  [facility] 12/18/2017, 10:55 AM

## 2017-12-18 NOTE — Progress Notes (Signed)
Patient discharged home with family, discharge instructions/COPD print out given and explained to patient, she verbalized understanding, denies any pain/distress. Accompanied home by son, transported to the car by staff via wheelchair. No wound noted.

## 2018-04-08 IMAGING — CT CT ANGIO CHEST
1 of 6 series · 19 of 36 positions shown · IV contrast (APPLIED)
Comparison: Radiograph 12/15/2017

CLINICAL DATA: Dyspnea cough

EXAM:
CT ANGIOGRAPHY CHEST WITH CONTRAST
TECHNIQUE: Multidetector CT imaging of the chest was performed using the
standard protocol during bolus administration of intravenous
contrast. Multiplanar CT image reconstructions and MIPs were
obtained to evaluate the vascular anatomy.
CONTRAST:  100 cc Isovue 370 intravenous

[Series 5: thins · axial · 0.87mm/px · z∈[-350,-62]mm · 19 of 320 slices shown]
[im 16/320  lung]
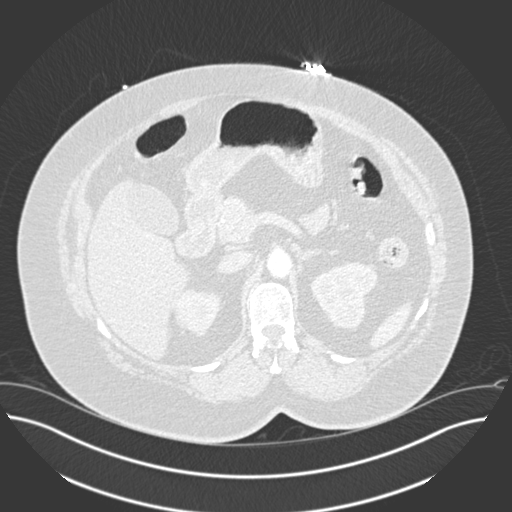
[im 32/320  mediastinal]
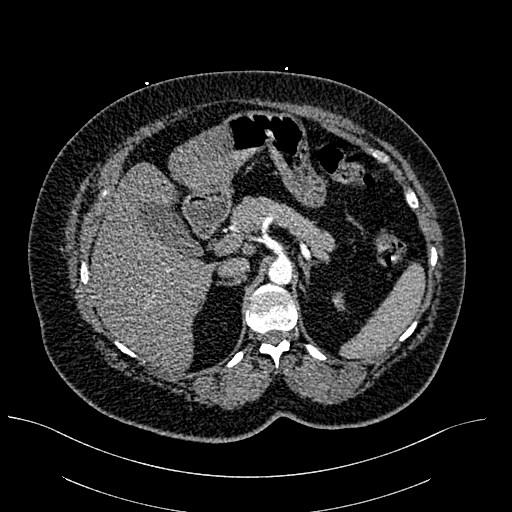
[im 48/320  lung]
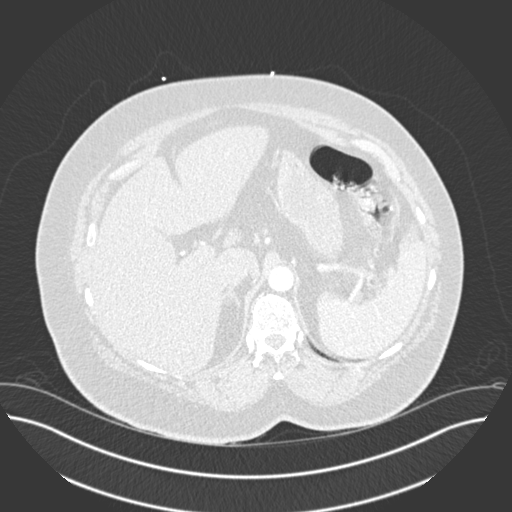
[im 64/320  mediastinal]
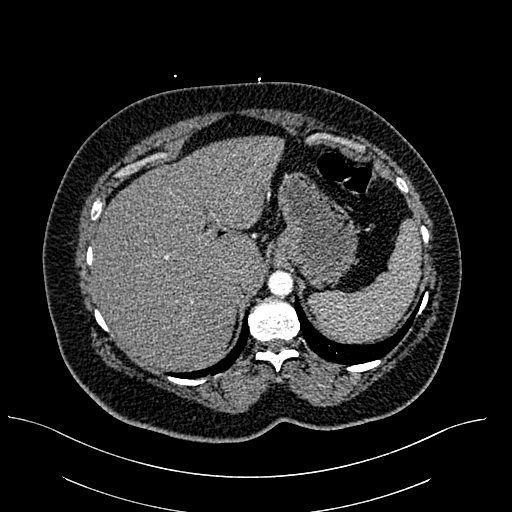
[im 80/320  lung]
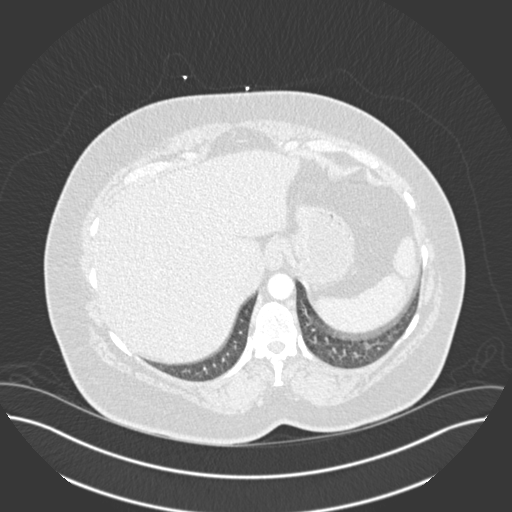
[im 96/320  mediastinal]
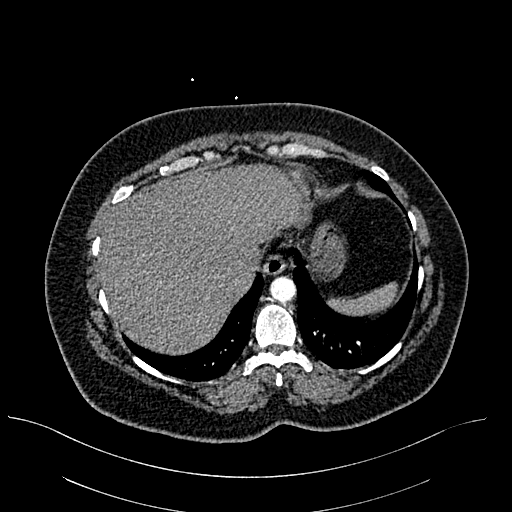
[im 112/320  lung]
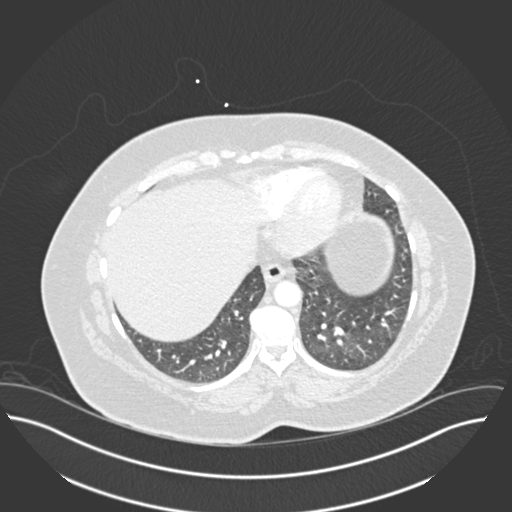
[im 128/320  mediastinal]
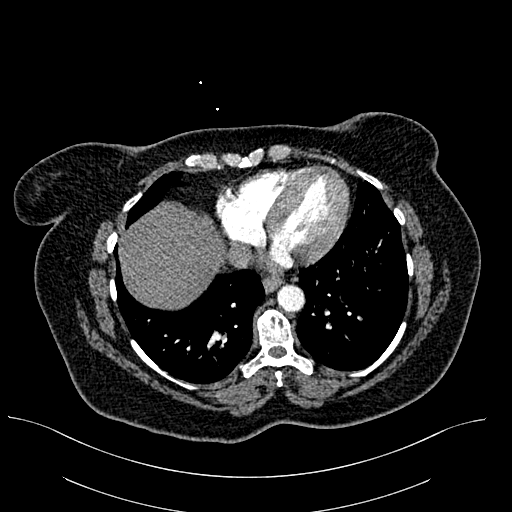
[im 144/320  lung]
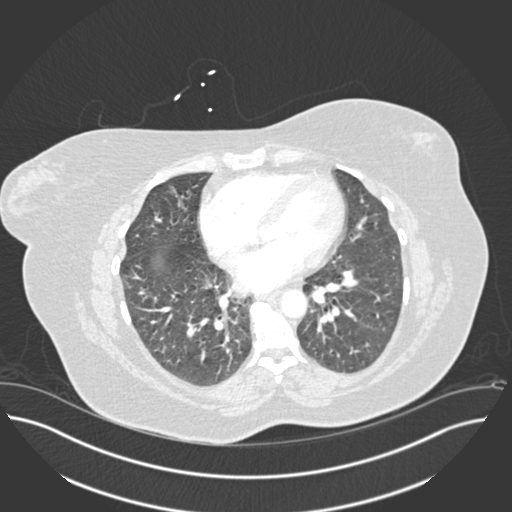
[im 160/320  mediastinal]
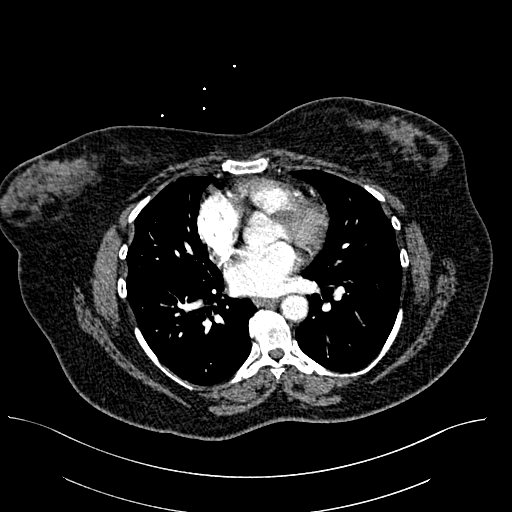
[im 176/320  lung]
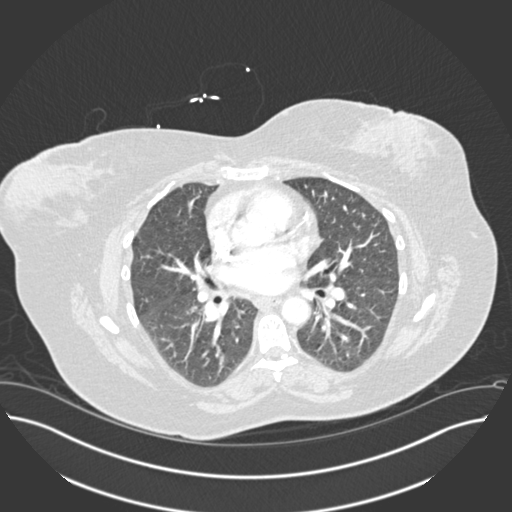
[im 192/320  mediastinal]
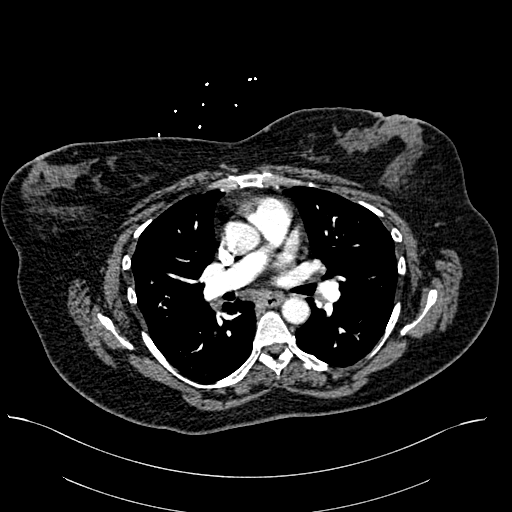
[im 208/320  lung]
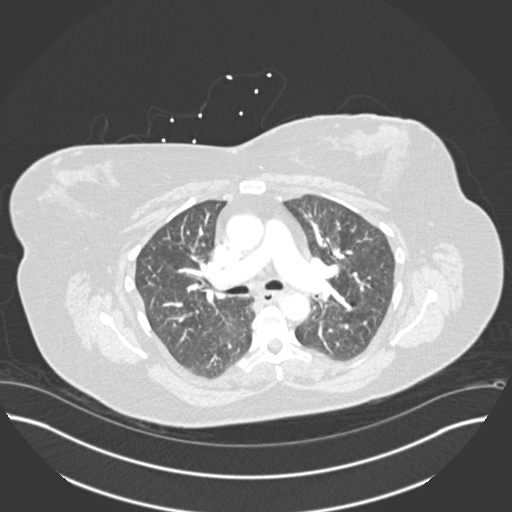
[im 224/320  mediastinal]
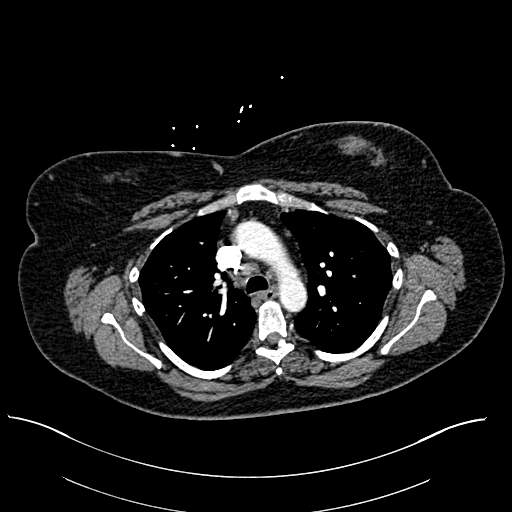
[im 240/320  lung]
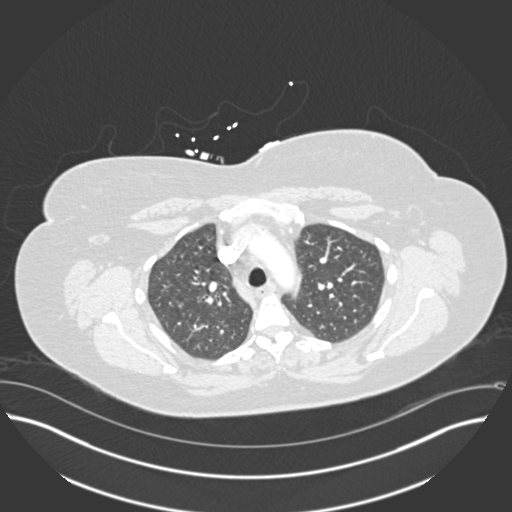
[im 256/320  mediastinal]
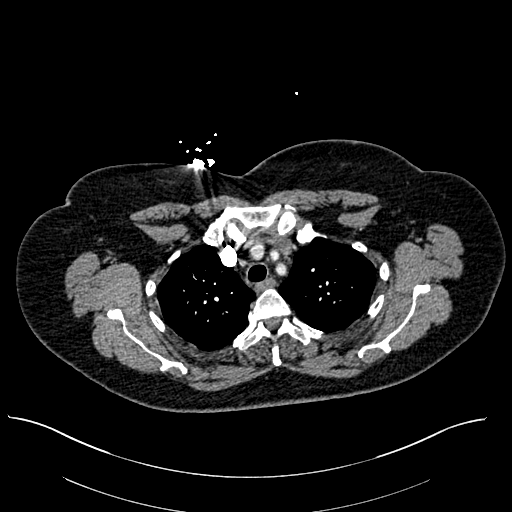
[im 272/320  lung]
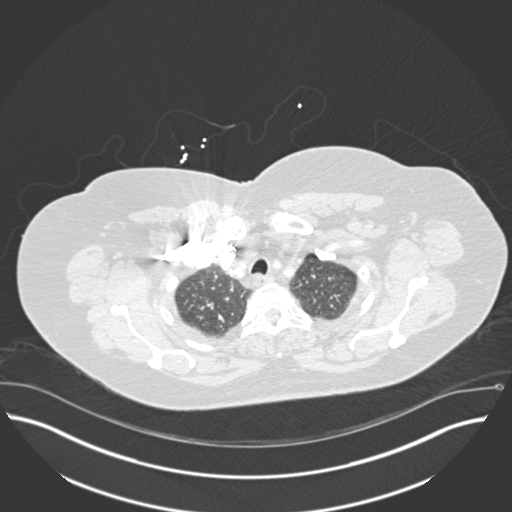
[im 288/320  mediastinal]
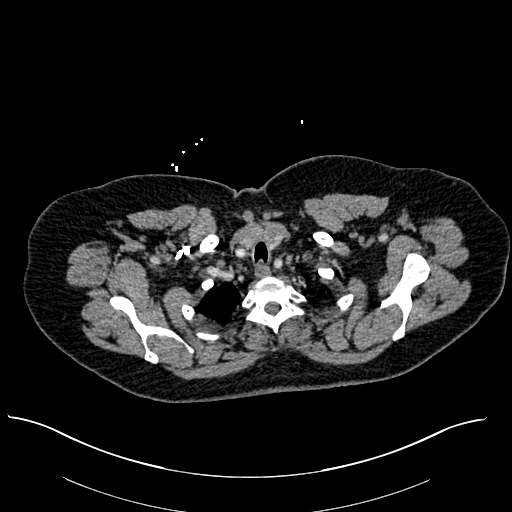
[im 304/320  lung]
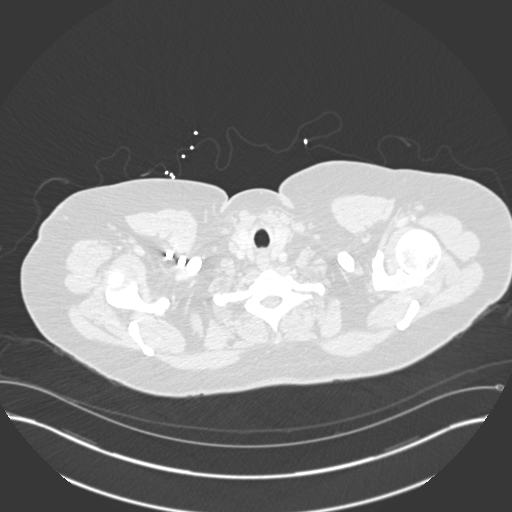

[19 of 36 positions shown; findings below may reference images not displayed]

FINDINGS: Cardiovascular: Satisfactory opacification of the pulmonary arteries
to the segmental level. No evidence of pulmonary embolism.
Nonaneurysmal aorta. No dissection is seen. Coronary artery
calcification. Normal heart size. No pericardial effusion.

Mediastinum/Nodes: Midline trachea. No thyroid mass. Mild
mediastinal lymph nodes, for example precarinal lymph node measures
14 mm, series 4, image number 34. Subcarinal lymph node measures 12
mm, series 4, image number 41. Small hilar lymph nodes, measuring up
to 11 mm on the right and 9 mm on the left esophagus within normal
limits.

Lungs/Pleura: No pleural effusion or pneumothorax. Minimal apical
emphysema. Mild centrilobular nodularity in the right upper lobe.

Upper Abdomen: No acute abnormality.

Musculoskeletal: No chest wall abnormality. No acute or significant
osseous findings.

Review of the MIP images confirms the above findings.
IMPRESSION: 1. Negative for acute pulmonary embolus or aortic dissection
2. Mild apical emphysema. Mild nodularity in the right upper lobe
suggesting bronchiolitis.
3. Mild mediastinal lymph nodes, likely reactive.

Emphysema ([id number].6).

## 2018-04-08 IMAGING — CR DG CHEST 2V
2 series · 2 of 2 positions shown · non-contrast
Comparison: 02/25/2017

CLINICAL DATA: Cough and shortness of breath.

EXAM:
CHEST  2 VIEW

[w chest pa]
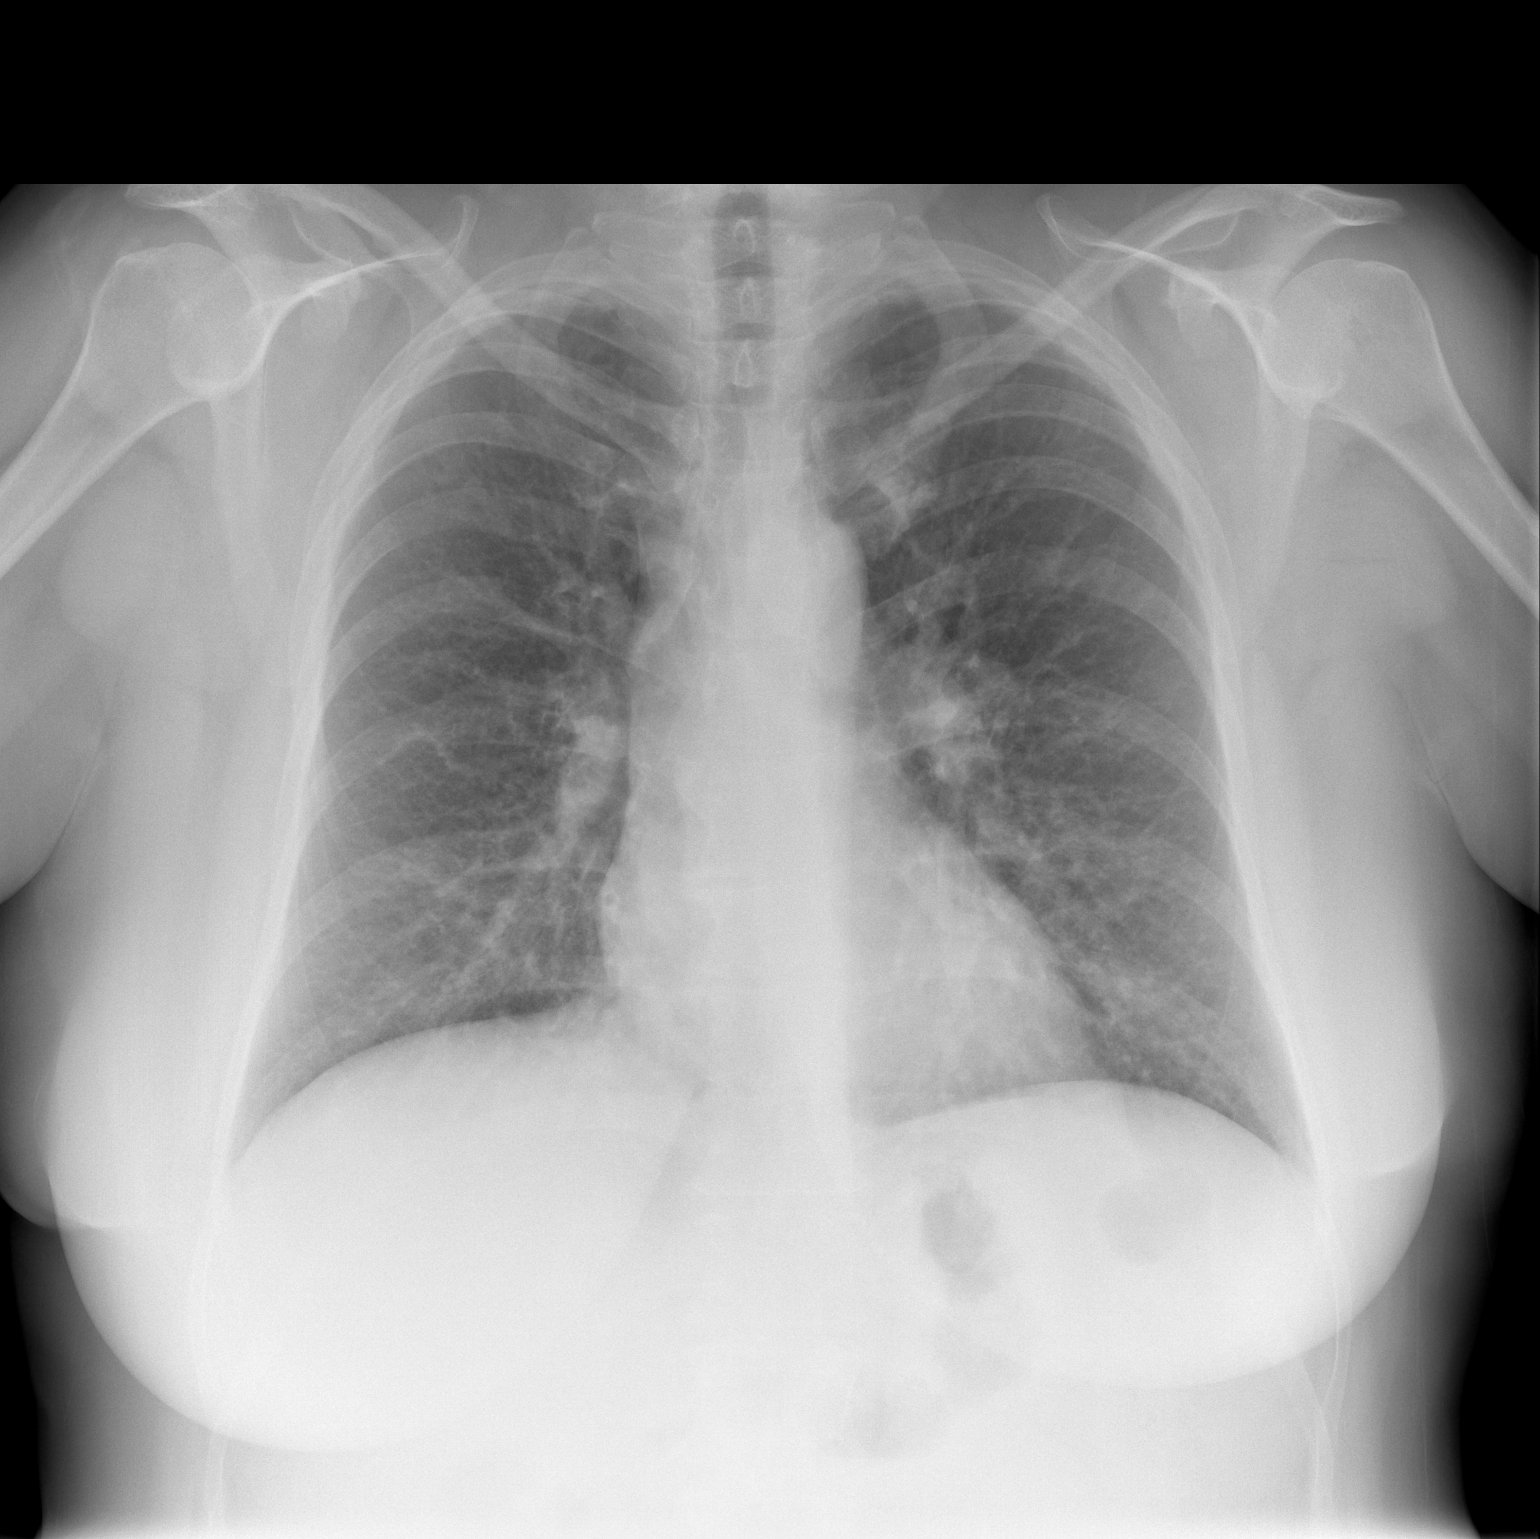

[w chest lat]
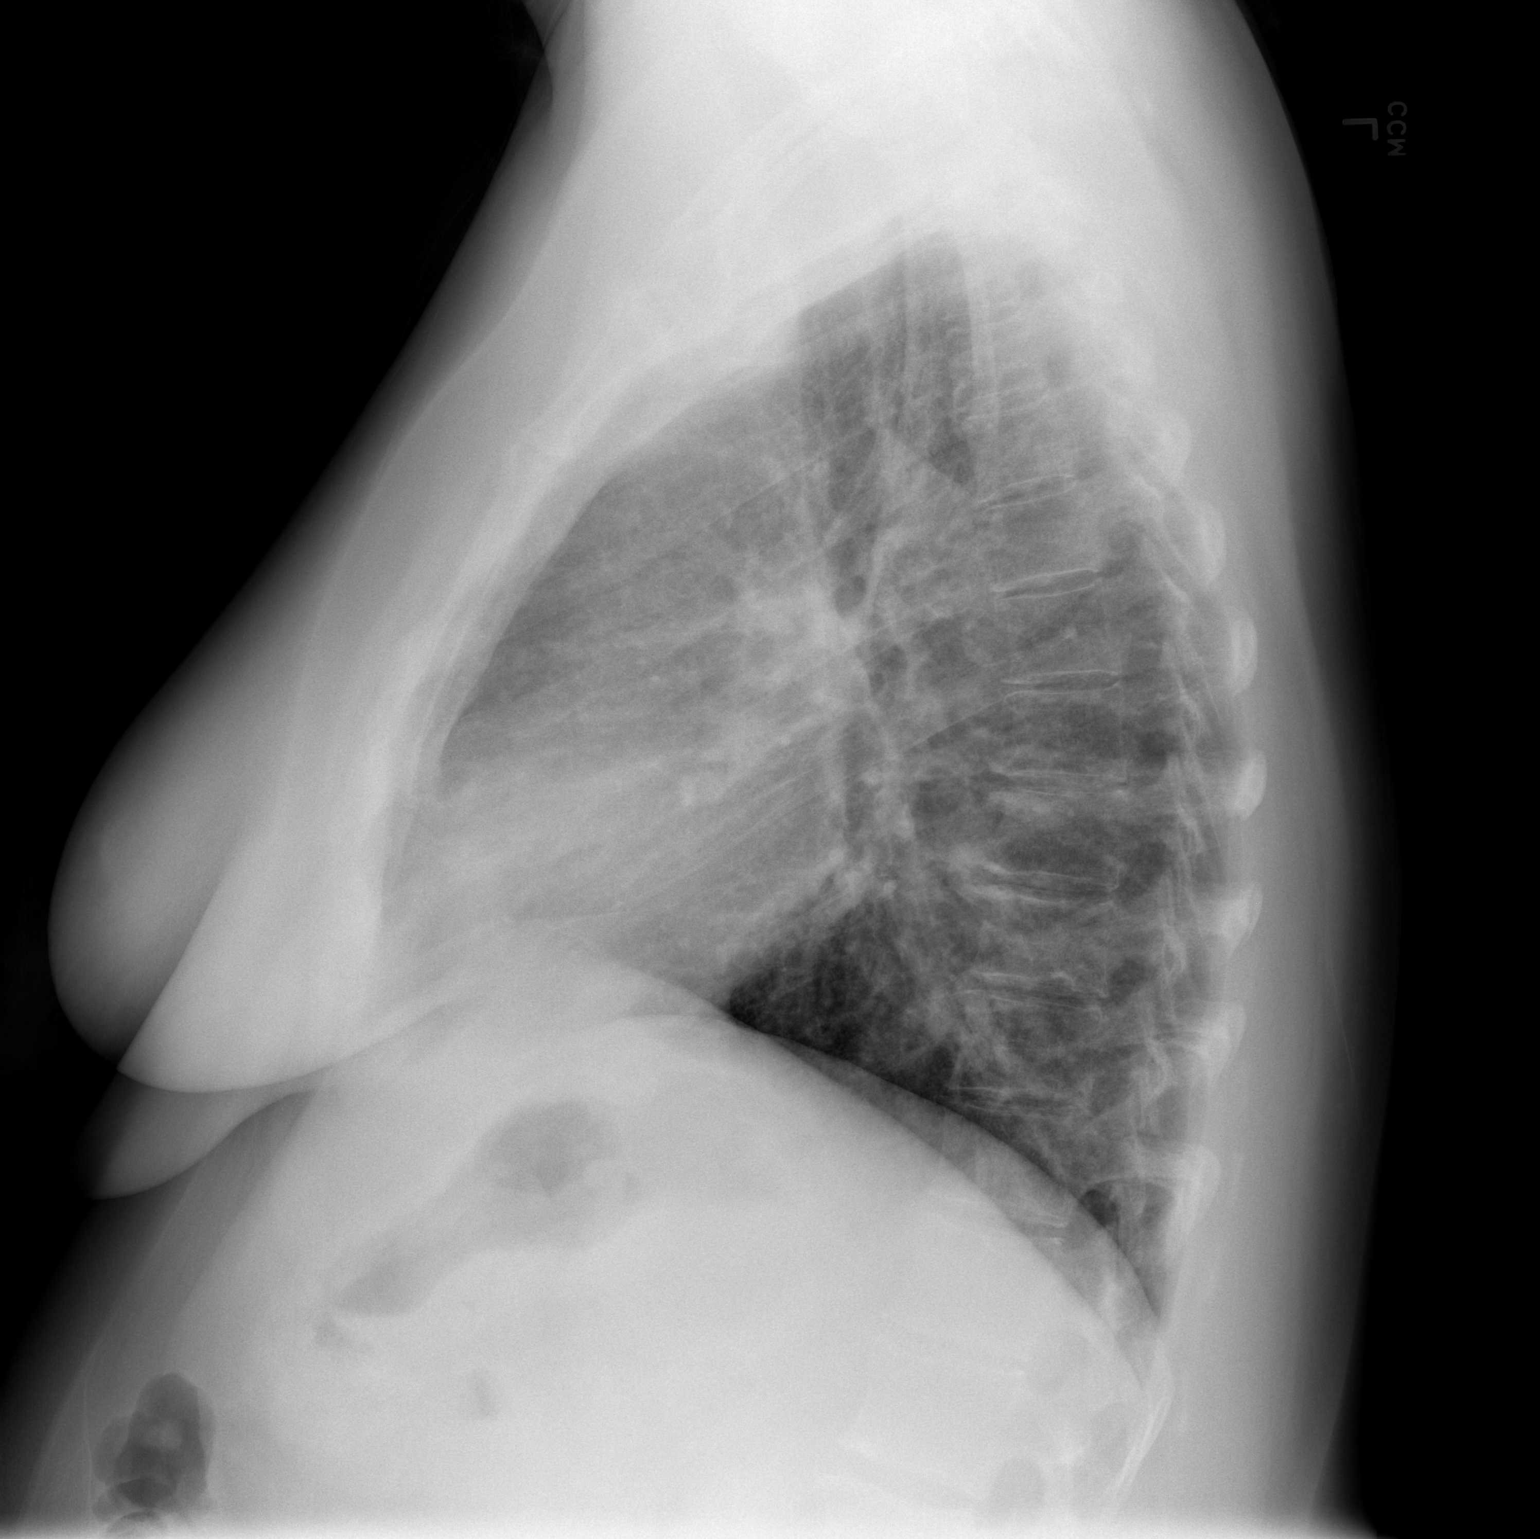

[2 of 2 positions shown; findings below may reference images not displayed]

FINDINGS: Lung markings are slightly prominent particularly in the left lower
chest. However, these findings are not significantly changed and are
suggestive for chronic changes. There is no focal airspace disease
or lung consolidation. Heart and mediastinum are within normal
limits. Trachea is midline. No large pleural effusions. No acute
bone abnormality.
IMPRESSION: No active cardiopulmonary disease.

## 2018-09-29 ENCOUNTER — Other Ambulatory Visit: Payer: Self-pay | Admitting: Family Medicine

## 2018-09-29 DIAGNOSIS — R55 Syncope and collapse: Secondary | ICD-10-CM

## 2019-04-04 ENCOUNTER — Other Ambulatory Visit: Payer: Self-pay | Admitting: Family Medicine

## 2019-04-04 DIAGNOSIS — E1165 Type 2 diabetes mellitus with hyperglycemia: Secondary | ICD-10-CM

## 2019-04-20 ENCOUNTER — Other Ambulatory Visit: Payer: Self-pay

## 2019-04-20 ENCOUNTER — Encounter (HOSPITAL_BASED_OUTPATIENT_CLINIC_OR_DEPARTMENT_OTHER): Payer: Self-pay | Admitting: *Deleted

## 2019-04-20 ENCOUNTER — Emergency Department (HOSPITAL_BASED_OUTPATIENT_CLINIC_OR_DEPARTMENT_OTHER): Payer: No Typology Code available for payment source

## 2019-04-20 ENCOUNTER — Emergency Department (HOSPITAL_BASED_OUTPATIENT_CLINIC_OR_DEPARTMENT_OTHER)
Admission: EM | Admit: 2019-04-20 | Discharge: 2019-04-20 | Disposition: A | Discharge status: Home / Self Care | Payer: No Typology Code available for payment source | Attending: Emergency Medicine | Admitting: Emergency Medicine

## 2019-04-20 DIAGNOSIS — F17200 Nicotine dependence, unspecified, uncomplicated: Secondary | ICD-10-CM | POA: Insufficient documentation

## 2019-04-20 DIAGNOSIS — Z79899 Other long term (current) drug therapy: Secondary | ICD-10-CM | POA: Diagnosis not present

## 2019-04-20 DIAGNOSIS — E119 Type 2 diabetes mellitus without complications: Secondary | ICD-10-CM | POA: Insufficient documentation

## 2019-04-20 DIAGNOSIS — J449 Chronic obstructive pulmonary disease, unspecified: Secondary | ICD-10-CM | POA: Diagnosis not present

## 2019-04-20 DIAGNOSIS — Z794 Long term (current) use of insulin: Secondary | ICD-10-CM | POA: Diagnosis not present

## 2019-04-20 DIAGNOSIS — Z043 Encounter for examination and observation following other accident: Secondary | ICD-10-CM | POA: Insufficient documentation

## 2019-04-20 MED ORDER — NAPROXEN 500 MG PO TABS: 500.0000 mg | ORAL_TABLET | Freq: Two times a day (BID) | ORAL | 0 refills | Status: DC

## 2019-04-20 MED ORDER — METHOCARBAMOL 500 MG PO TABS: 500.0000 mg | ORAL_TABLET | Freq: Three times a day (TID) | ORAL | 0 refills | Status: DC | PRN
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
## 2019-10-31 ENCOUNTER — Emergency Department (HOSPITAL_BASED_OUTPATIENT_CLINIC_OR_DEPARTMENT_OTHER)
Admission: EM | Admit: 2019-10-31 | Discharge: 2019-11-01 | Disposition: A | Discharge status: Home / Self Care | Payer: Self-pay | Attending: Emergency Medicine | Admitting: Emergency Medicine

## 2019-10-31 ENCOUNTER — Encounter (HOSPITAL_BASED_OUTPATIENT_CLINIC_OR_DEPARTMENT_OTHER): Payer: Self-pay | Admitting: Emergency Medicine

## 2019-10-31 ENCOUNTER — Other Ambulatory Visit: Payer: Self-pay

## 2019-10-31 DIAGNOSIS — Z79899 Other long term (current) drug therapy: Secondary | ICD-10-CM | POA: Insufficient documentation

## 2019-10-31 DIAGNOSIS — J441 Chronic obstructive pulmonary disease with (acute) exacerbation: Secondary | ICD-10-CM | POA: Insufficient documentation

## 2019-10-31 DIAGNOSIS — Z794 Long term (current) use of insulin: Secondary | ICD-10-CM | POA: Insufficient documentation

## 2019-10-31 DIAGNOSIS — Z20822 Contact with and (suspected) exposure to covid-19: Secondary | ICD-10-CM

## 2019-10-31 DIAGNOSIS — F17210 Nicotine dependence, cigarettes, uncomplicated: Secondary | ICD-10-CM | POA: Insufficient documentation

## 2019-10-31 DIAGNOSIS — Z791 Long term (current) use of non-steroidal anti-inflammatories (NSAID): Secondary | ICD-10-CM | POA: Insufficient documentation

## 2019-10-31 DIAGNOSIS — Z20828 Contact with and (suspected) exposure to other viral communicable diseases: Secondary | ICD-10-CM | POA: Insufficient documentation

## 2019-10-31 DIAGNOSIS — J40 Bronchitis, not specified as acute or chronic: Secondary | ICD-10-CM

## 2019-10-31 DIAGNOSIS — E119 Type 2 diabetes mellitus without complications: Secondary | ICD-10-CM | POA: Insufficient documentation

## 2019-10-31 HISTORY — DX: Chronic obstructive pulmonary disease, unspecified: J44.9

## 2019-10-31 LAB — SARS CORONAVIRUS 2 AG (30 MIN TAT): SARS Coronavirus 2 Ag: NEGATIVE

## 2019-10-31 MED ORDER — ALBUTEROL SULFATE HFA 108 (90 BASE) MCG/ACT IN AERS
6.0000 | INHALATION_SPRAY | Freq: Once | RESPIRATORY_TRACT | Status: AC
  Administered 2019-10-31: 6 via RESPIRATORY_TRACT
  Filled 2019-10-31: qty 6.7

## 2019-10-31 NOTE — ED Triage Notes (Addendum)
Pt to ED via EMS with c/o shob. EMS reports initial oxygen sat 92% on RA. Oxygen sat decreased to 89% with exertion. Pt was given IM epi, 125mg  of solumedrol, and 2 grams magnesium sulfate by EMS. Pt had temp of 101.5 with EMS and given 1 gram tylenol PO. Pt had negative rapid COVID test x 1 day ago.

## 2019-11-01 ENCOUNTER — Emergency Department (HOSPITAL_BASED_OUTPATIENT_CLINIC_OR_DEPARTMENT_OTHER): Payer: Self-pay

## 2019-11-01 LAB — CBC WITH DIFFERENTIAL/PLATELET
Abs Immature Granulocytes: 0.08 10*3/uL — ABNORMAL HIGH (ref 0.00–0.07)
Basophils Absolute: 0 10*3/uL (ref 0.0–0.1)
Basophils Relative: 0 %
Eosinophils Absolute: 0.3 10*3/uL (ref 0.0–0.5)
Eosinophils Relative: 3 %
HCT: 47.3 % — ABNORMAL HIGH (ref 36.0–46.0)
Hemoglobin: 15.4 g/dL — ABNORMAL HIGH (ref 12.0–15.0)
Immature Granulocytes: 1 %
Lymphocytes Relative: 17 %
Lymphs Abs: 1.5 10*3/uL (ref 0.7–4.0)
MCH: 31.5 pg (ref 26.0–34.0)
MCHC: 32.6 g/dL (ref 30.0–36.0)
MCV: 96.7 fL (ref 80.0–100.0)
Monocytes Absolute: 0.6 10*3/uL (ref 0.1–1.0)
Monocytes Relative: 7 %
Neutro Abs: 6.5 10*3/uL (ref 1.7–7.7)
Neutrophils Relative %: 72 %
Platelets: 230 10*3/uL (ref 150–400)
RBC: 4.89 MIL/uL (ref 3.87–5.11)
RDW: 13.4 % (ref 11.5–15.5)
WBC: 9 10*3/uL (ref 4.0–10.5)
nRBC: 0 % (ref 0.0–0.2)

## 2019-11-01 LAB — COMPREHENSIVE METABOLIC PANEL
ALT: 19 U/L (ref 0–44)
AST: 16 U/L (ref 15–41)
Albumin: 3.3 g/dL — ABNORMAL LOW (ref 3.5–5.0)
Alkaline Phosphatase: 109 U/L (ref 38–126)
Anion gap: 10 (ref 5–15)
BUN: 15 mg/dL (ref 6–20)
CO2: 22 mmol/L (ref 22–32)
Calcium: 8.2 mg/dL — ABNORMAL LOW (ref 8.9–10.3)
Chloride: 103 mmol/L (ref 98–111)
Creatinine, Ser: 0.84 mg/dL (ref 0.44–1.00)
GFR calc Af Amer: >60 mL/min (ref >60)
GFR calc non Af Amer: >60 mL/min (ref >60)
Glucose, Bld: 281 mg/dL — ABNORMAL HIGH (ref 70–99)
Potassium: 3.4 mmol/L — ABNORMAL LOW (ref 3.5–5.1)
Sodium: 135 mmol/L (ref 135–145)
Total Bilirubin: 0.4 mg/dL (ref 0.3–1.2)
Total Protein: 6.8 g/dL (ref 6.5–8.1)

## 2019-11-01 MED ORDER — PREDNISONE 10 MG PO TABS: 40.0000 mg | ORAL_TABLET | Freq: Every day | ORAL | 0 refills | Status: AC

## 2019-11-01 NOTE — ED Notes (Signed)
Assisted pt up to bedside commode. Tolerated well. Ambulated pt around her bed and back. O2 sat down to 92-93%. Tolerated fair. States it made her feel tired.

## 2019-11-01 NOTE — ED Provider Notes (Signed)
[facility] EMERGENCY DEPARTMENT Provider Note  CSN: [id number] Arrival date & time: 10/31/19 2306  Chief Complaint(s) Shortness of Breath  HPI [name] is a 58 y.o. female with a history of diabetes, COPD not on oxygen on any medication, chronic smoker who presents to the emergency department with 1 day of gradual shortness of breath.  Patient reports couple days of nasal congestion, and malaise.  Reports that she was exposed to Covid positive coworker who she has been working with all week.  Coworker tested positive on Monday.  Patient denies any associated chest pain.  No nausea or vomiting.  No abdominal pain.  No diarrhea.  No urinary symptoms.  Patient brought in by EMS who noted patient was febrile to 101.5, tachycardic to 130s.  They reported exertional hypoxia.  Patient had diffuse wheezing and was given 1 g of Tylenol, breathing treatments, Solu-Medrol, IV mag, and IM epinephrine.  Following treatment, patient's breathing has improved.   HPI  Past Medical History Past Medical History:  Diagnosis Date  . COPD (chronic obstructive pulmonary disease) (HCC)   . Diabetes mellitus without complication (HCC)   . Kidney stone    Patient Active Problem List   Diagnosis Date Noted  . Hypoxia 12/15/2017  . COPD exacerbation (HCC) 12/15/2017  . Tachycardia 12/15/2017  . Nicotine dependence 12/15/2017   Home Medication(s) Prior to Admission medications   Medication Sig Start Date End Date Taking? Authorizing Provider  albuterol (PROVENTIL HFA;VENTOLIN HFA) 108 (90 Base) MCG/ACT inhaler Inhale 2 puffs into the lungs every 6 (six) hours as needed for wheezing or shortness of breath. 12/18/17   Albertine GratesXu, Fang, MD  benzonatate (TESSALON) 100 MG capsule Take 1 capsule (100 mg total) by mouth 3 (three) times daily as needed for cough. 12/18/17   Albertine GratesXu, Fang, MD  glipiZIDE (GLUCOTROL XL) 10 MG 24 hr tablet Take 10 mg by mouth daily with breakfast.    [provider]   guaiFENesin (MUCINEX) 600 MG 12 hr tablet Take 1 tablet (600 mg total) by mouth 2 (two) times daily. 12/18/17   Albertine GratesXu, Fang, MD  insulin aspart (NOVOLOG) 100 UNIT/ML injection Inject 5 Units into the skin once. If BS over 250    [provider]  insulin NPH Human (HUMULIN N,NOVOLIN N) 100 UNIT/ML injection Inject 27 Units into the skin 2 (two) times daily before a meal.     [provider]  methocarbamol (ROBAXIN) 500 MG tablet Take 1 tablet (500 mg total) by mouth every 8 (eight) hours as needed. 04/20/19   Petrucelli, Samantha R, PA-C  naproxen (NAPROSYN) 500 MG tablet Take 1 tablet (500 mg total) by mouth 2 (two) times daily. 04/20/19   Petrucelli, Samantha R, PA-C  predniSONE (DELTASONE) 10 MG tablet Take 4 tablets (40 mg total) by mouth daily for 4 days. 11/01/19 11/05/19  Nira Connardama, Rylynne Schicker Eduardo, MD  tamsulosin (FLOMAX) 0.4 MG CAPS capsule Take 1 capsule (0.4 mg total) by mouth daily. 05/11/14   Elson AreasSofia, Leslie K, PA-C  Past Surgical History Past Surgical History:  Procedure Laterality Date  . ABDOMINAL HYSTERECTOMY    . BLADDER SURGERY     Family History Family History  Problem Relation Age of Onset  . Heart failure Father     Social History Social History   Tobacco Use  . Smoking status: Current Every Day Smoker    Packs/day: 1.00  . Smokeless tobacco: Never Used  Substance Use Topics  . Alcohol use: No  . Drug use: No   Allergies Sulfa antibiotics  Review of Systems Review of Systems All other systems are reviewed and are negative for acute change except as noted in the HPI  Physical Exam Vital Signs  I have reviewed the triage vital signs BP (!) 141/63 (BP Location: Left Arm)   Pulse 92   Temp 98.9 F (37.2 C) (Oral)   Resp 20   Ht 5\' 6"  (1.676 m)   Wt 96.2 kg   SpO2 92%   BMI 34.22 kg/m   Physical Exam Vitals  reviewed.  Constitutional:      General: She is not in acute distress.    Appearance: She is well-developed. She is not diaphoretic.  HENT:     Head: Normocephalic and atraumatic.     Nose: Nose normal.  Eyes:     General: No scleral icterus.       Right eye: No discharge.        Left eye: No discharge.     Conjunctiva/sclera: Conjunctivae normal.     Pupils: Pupils are equal, round, and reactive to light.  Cardiovascular:     Rate and Rhythm: Normal rate and regular rhythm.     Heart sounds: No murmur. No friction rub. No gallop.   Pulmonary:     Effort: Pulmonary effort is normal. No respiratory distress.     Breath sounds: No stridor. Wheezing (mild exp wheezing throughout) present. No rhonchi or rales.  Abdominal:     General: There is no distension.     Palpations: Abdomen is soft.     Tenderness: There is no abdominal tenderness.  Musculoskeletal:        General: No tenderness.     Cervical back: Normal range of motion and neck supple.  Skin:    General: Skin is warm and dry.     Findings: No erythema or rash.  Neurological:     Mental Status: She is alert and oriented to person, place, and time.     ED Results and Treatments Labs (all labs ordered are listed, but only abnormal results are displayed) Labs Reviewed  CBC WITH DIFFERENTIAL/PLATELET - Abnormal; Notable for the following components:      Result Value   Hemoglobin 15.4 (*)    HCT 47.3 (*)    Abs Immature Granulocytes 0.08 (*)    All other components within normal limits  COMPREHENSIVE METABOLIC PANEL - Abnormal; Notable for the following components:   Potassium 3.4 (*)    Glucose, Bld 281 (*)    Calcium 8.2 (*)    Albumin 3.3 (*)    All other components within normal limits  SARS CORONAVIRUS 2 AG (30 MIN TAT)  EKG  EKG Interpretation  Date/Time:  Wednesday October 31 2019  23:28:02 EST Ventricular Rate:  106 PR Interval:    QRS Duration: 96 QT Interval:  339 QTC Calculation: 451 R Axis:   80 Text Interpretation: Sinus tachycardia Borderline repolarization abnormality No significant change since last tracing Confirmed by Drema Pry [phone]) on 11/01/2019 12:46:58 AM      Radiology DG Chest Portable 1 View  Result Date: 11/01/2019 CLINICAL DATA:  Fever and shortness of breath. EXAM: PORTABLE CHEST 1 VIEW COMPARISON:  Radiograph 2920. CT 12/15/2017 FINDINGS: Mild hyperinflation. Increased bronchial thickening from prior exam. Heart is normal in size with normal mediastinal contours. No confluent airspace disease. No pneumothorax or large pleural effusion. No acute osseous abnormalities are seen. Small soft tissue calcification adjacent to the right lateral humeral head is chronic. IMPRESSION: Hyperinflation with increased bronchial thickening, suspicious for acute bronchitis. Electronically Signed   By: Narda Rutherford M.D.   On: 11/01/2019 00:28    Pertinent labs & imaging results that were available during my care of the patient were reviewed by me and considered in my medical decision making (see chart for details).  Medications Ordered in ED Medications  albuterol (VENTOLIN HFA) 108 (90 Base) MCG/ACT inhaler 6 puff (6 puffs Inhalation Given 10/31/19 2342)                                                                                                                                    Procedures Procedures  (including critical care time)  Medical Decision Making / ED Course I have reviewed the nursing notes for this encounter and the patient's prior records (if available in EHR or on provided paperwork).   Jill Barnes was evaluated in Emergency Department on 11/01/2019 for the symptoms described in the history of present illness. She was evaluated in the context of the global COVID-19 pandemic, which necessitated consideration that the patient  might be at risk for infection with the SARS-CoV-2 virus that causes COVID-19. Institutional protocols and algorithms that pertain to the evaluation of patients at risk for COVID-19 are in a state of rapid change based on information released by regulatory bodies including the CDC and federal and state organizations. These policies and algorithms were followed during the patient's care in the ED.  Patient presents with viral symptoms for 2-3 days. adequate oral hydration. Rest of history as above.  Treated by EMS as above.  Patient now appears well. No signs of toxicity, patient is interactive. No hypoxia, tachypnea or other signs of respiratory distress. No sign of clinical dehydration. Lung exam with mild exp wheezing. Rest of exam as above.  Labs without leukocytosis or anemia.  No significant electrolyte derangements or renal insufficiency.  Rapid Covid test negative. CXR with likely bronchitis. No PNA, PTx noted  Most consistent with viral illness   No evidence suggestive of pharyngitis, AOM, PNA, or meningitis.  Patient remained stable throughout her  stay.  She was monitored for at least 5 hours.  No increased work of breathing.  Her sats were stable and above 95% on room air.  Patient able to ambulate without increased work of breathing or desaturations.  Discussed symptomatic treatment with the patient and they will follow closely with their PCP.        Final Clinical Impression(s) / ED Diagnoses Final diagnoses:  Bronchitis  Suspected COVID-19 virus infection     The patient appears reasonably screened and/or stabilized for discharge and I doubt any other medical condition or other [facility] requiring further screening, evaluation, or treatment in the ED at this time prior to discharge.  Disposition: Discharge  Condition: Good  I have discussed the results, Dx and Tx plan with the patient who expressed understanding and agree(s) with the plan. Discharge instructions discussed  at great length. The patient was given strict return precautions who verbalized understanding of the instructions. No further questions at time of discharge.    ED Discharge Orders         Ordered    predniSONE (DELTASONE) 10 MG tablet  Daily     11/01/19 0404           Follow Up: Glenford Bayley, DO [place] [address] [phone]  Schedule an appointment as soon as possible for a visit  If symptoms do not improve or  worsen     This chart was dictated using voice recognition software.  Despite best efforts to proofread,  errors can occur which can change the documentation meaning.   Fatima Blank, MD 11/01/19 [phone]

## 2019-11-01 NOTE — Progress Notes (Signed)
Placed patient on 1 liter nasal cannula to maintain SPO2 > 92%

## 2020-06-16 ENCOUNTER — Encounter (HOSPITAL_BASED_OUTPATIENT_CLINIC_OR_DEPARTMENT_OTHER): Payer: Self-pay | Admitting: *Deleted

## 2020-06-16 ENCOUNTER — Emergency Department (HOSPITAL_BASED_OUTPATIENT_CLINIC_OR_DEPARTMENT_OTHER): Payer: HRSA Program

## 2020-06-16 ENCOUNTER — Telehealth: Payer: Self-pay | Admitting: Physician Assistant

## 2020-06-16 ENCOUNTER — Other Ambulatory Visit: Payer: Self-pay

## 2020-06-16 ENCOUNTER — Emergency Department (HOSPITAL_BASED_OUTPATIENT_CLINIC_OR_DEPARTMENT_OTHER)
Admission: EM | Admit: 2020-06-16 | Discharge: 2020-06-16 | Disposition: A | Discharge status: Home / Self Care | Payer: HRSA Program | Attending: Emergency Medicine | Admitting: Emergency Medicine

## 2020-06-16 DIAGNOSIS — U071 COVID-19: Secondary | ICD-10-CM | POA: Diagnosis not present

## 2020-06-16 DIAGNOSIS — Z79899 Other long term (current) drug therapy: Secondary | ICD-10-CM | POA: Diagnosis not present

## 2020-06-16 DIAGNOSIS — F17200 Nicotine dependence, unspecified, uncomplicated: Secondary | ICD-10-CM | POA: Diagnosis not present

## 2020-06-16 DIAGNOSIS — J441 Chronic obstructive pulmonary disease with (acute) exacerbation: Secondary | ICD-10-CM | POA: Diagnosis not present

## 2020-06-16 DIAGNOSIS — R1012 Left upper quadrant pain: Secondary | ICD-10-CM | POA: Insufficient documentation

## 2020-06-16 DIAGNOSIS — E119 Type 2 diabetes mellitus without complications: Secondary | ICD-10-CM | POA: Diagnosis not present

## 2020-06-16 DIAGNOSIS — R1031 Right lower quadrant pain: Secondary | ICD-10-CM | POA: Diagnosis present

## 2020-06-16 DIAGNOSIS — Z794 Long term (current) use of insulin: Secondary | ICD-10-CM | POA: Diagnosis not present

## 2020-06-16 DIAGNOSIS — R1032 Left lower quadrant pain: Secondary | ICD-10-CM

## 2020-06-16 LAB — URINALYSIS, ROUTINE W REFLEX MICROSCOPIC
Bilirubin Urine: NEGATIVE
Glucose, UA: NEGATIVE mg/dL
Hgb urine dipstick: NEGATIVE
Ketones, ur: NEGATIVE mg/dL
Leukocytes,Ua: NEGATIVE
Nitrite: NEGATIVE
Protein, ur: NEGATIVE mg/dL
Specific Gravity, Urine: 1.025 (ref 1.005–1.030)
pH: 6 (ref 5.0–8.0)

## 2020-06-16 LAB — CBC WITH DIFFERENTIAL/PLATELET
Abs Immature Granulocytes: 0.03 10*3/uL (ref 0.00–0.07)
Basophils Absolute: 0 10*3/uL (ref 0.0–0.1)
Basophils Relative: 0 %
Eosinophils Absolute: 0 10*3/uL (ref 0.0–0.5)
Eosinophils Relative: 1 %
HCT: 46.9 % — ABNORMAL HIGH (ref 36.0–46.0)
Hemoglobin: 15.2 g/dL — ABNORMAL HIGH (ref 12.0–15.0)
Immature Granulocytes: 1 %
Lymphocytes Relative: 30 %
Lymphs Abs: 1.1 10*3/uL (ref 0.7–4.0)
MCH: 30.7 pg (ref 26.0–34.0)
MCHC: 32.4 g/dL (ref 30.0–36.0)
MCV: 94.7 fL (ref 80.0–100.0)
Monocytes Absolute: 0.3 10*3/uL (ref 0.1–1.0)
Monocytes Relative: 9 %
Neutro Abs: 2.3 10*3/uL (ref 1.7–7.7)
Neutrophils Relative %: 59 %
Platelets: 151 10*3/uL (ref 150–400)
RBC: 4.95 MIL/uL (ref 3.87–5.11)
RDW: 14.5 % (ref 11.5–15.5)
WBC: 3.8 10*3/uL — ABNORMAL LOW (ref 4.0–10.5)
nRBC: 0 % (ref 0.0–0.2)

## 2020-06-16 LAB — SARS CORONAVIRUS 2 BY RT PCR (HOSPITAL ORDER, PERFORMED IN ~~LOC~~ HOSPITAL LAB): SARS Coronavirus 2: POSITIVE — AB

## 2020-06-16 LAB — COMPREHENSIVE METABOLIC PANEL
ALT: 33 U/L (ref 0–44)
AST: 33 U/L (ref 15–41)
Albumin: 3.3 g/dL — ABNORMAL LOW (ref 3.5–5.0)
Alkaline Phosphatase: 101 U/L (ref 38–126)
Anion gap: 10 (ref 5–15)
BUN: 13 mg/dL (ref 6–20)
CO2: 25 mmol/L (ref 22–32)
Calcium: 7.6 mg/dL — ABNORMAL LOW (ref 8.9–10.3)
Chloride: 98 mmol/L (ref 98–111)
Creatinine, Ser: 0.88 mg/dL (ref 0.44–1.00)
GFR calc Af Amer: >60 mL/min (ref >60)
GFR calc non Af Amer: >60 mL/min (ref >60)
Glucose, Bld: 233 mg/dL — ABNORMAL HIGH (ref 70–99)
Potassium: 3.2 mmol/L — ABNORMAL LOW (ref 3.5–5.1)
Sodium: 133 mmol/L — ABNORMAL LOW (ref 135–145)
Total Bilirubin: 0.4 mg/dL (ref 0.3–1.2)
Total Protein: 7.1 g/dL (ref 6.5–8.1)

## 2020-06-16 LAB — LIPASE, BLOOD: Lipase: 28 U/L (ref 11–51)

## 2020-06-16 NOTE — Telephone Encounter (Signed)
Called to discuss with patient about Covid symptoms and the use of bamlanivimab/etesevimab or casirivimab/imdevimab, a monoclonal antibody infusion for those with mild to moderate Covid symptoms and at a high risk of hospitalization.  Pt is qualified for this infusion at the [facility] infusion center due to Hypertension, DMT2, obesity and COPD   No answer and not able to leave a VM. I sent her a Mychart message with more info as well as our hotline #.   Cline Crock PA-C  [facility]

## 2020-06-16 NOTE — ED Notes (Signed)
Patient asleep, SPo2 87% on r/a.  Placed on 2l/m [place] SpO2 now 96%. RN aware.

## 2020-06-16 NOTE — ED Notes (Signed)
Pt reports all over body aches, low back, abdomen.  Denies n/v/d, mild cough x2 weeks

## 2020-06-16 NOTE — ED Triage Notes (Addendum)
Abdominal and back pain x 3 days. No bloating. Fever, chills, cough, loss of taste loss of smell

## 2020-06-16 NOTE — Discharge Instructions (Signed)
Please read and follow all provided instructions.  Your diagnoses today include:  1. COVID-19   2. Left lower quadrant abdominal pain     Tests performed today include:  Blood counts and electrolytes  Blood tests to check liver and kidney function  Blood tests to check pancreas function  Urine test to look for infection  COVID test - was POSITIVE  Vital signs. See below for your results today.   Medications prescribed:   None  Take any prescribed medications only as directed.  Home care instructions:   Follow any educational materials contained in this packet.  Follow-up instructions: Please follow-up with your primary care provider in the next 7 days for further evaluation of your symptoms.    Return instructions:  SEEK IMMEDIATE MEDICAL ATTENTION IF:  You become short of breath or have more difficulty breathing  The pain does not go away or becomes severe   A temperature above 101F develops and does not improve with medication  Repeated vomiting occurs (multiple episodes)   The pain becomes localized to portions of the abdomen. The right side could possibly be appendicitis. In an adult, the left lower portion of the abdomen could be colitis or diverticulitis.   Blood is being passed in stools or vomit (bright red or black tarry stools)   You develop chest pain, difficulty breathing, dizziness or fainting, or become confused, poorly responsive, or inconsolable (young children)  If you have any other emergent concerns regarding your health  Additional Information: Abdominal (belly) pain can be caused by many things. Your caregiver performed an examination and possibly ordered blood/urine tests and imaging (CT scan, x-rays, ultrasound). Many cases can be observed and treated at home after initial evaluation in the emergency department. Even though you are being discharged home, abdominal pain can be unpredictable. Therefore, you need a repeated exam if your pain  does not resolve, returns, or worsens. Most patients with abdominal pain don't have to be admitted to the hospital or have surgery, but serious problems like appendicitis and gallbladder attacks can start out as nonspecific pain. Many abdominal conditions cannot be diagnosed in one visit, so follow-up evaluations are very important.  Your vital signs today were: BP (!) 125/58 (BP Location: Left Arm)   Pulse 86   Temp 98.7 F (37.1 C) (Oral)   Resp 16   Ht 5\' 6"  (1.676 m)   Wt (!) 96.2 kg   SpO2 96%   BMI 34.23 kg/m  If your blood pressure (bp) was elevated above 135/85 this visit, please have this repeated by your doctor within one month. --------------

## 2020-06-16 NOTE — ED Notes (Signed)
Pt aware of need for urine specimen, bedside commode provided to bedside

## 2020-06-16 NOTE — ED Provider Notes (Signed)
[facility] EMERGENCY DEPARTMENT Provider Note   CSN: [id number] Arrival date & time: 06/16/20  1211     History Chief Complaint  Patient presents with  . Abdominal Pain  . Back Pain    Jill Barnes is a 59 y.o. female.  Patient with history of COPD currently smoking, diabetes, no previous abdominal surgeries other than hysterectomy --presents to the emergency department today with complaint of left sided abdominal pain with radiation to the back starting about 2 days ago.  She states that she had a temperature to 101F at the start of her illness.  She denies associated nausea, vomiting, diarrhea.  She has had chills and cough.  No known sick contacts or contacts with coronavirus.  She she has not received Covid vaccines.  No treatments prior to arrival.  No hematuria or irritative UTI symptoms including dysuria, increased frequency or urgency.          Past Medical History:  Diagnosis Date  . COPD (chronic obstructive pulmonary disease) (HCC)   . Diabetes mellitus without complication (HCC)   . Kidney stone     Patient Active Problem List   Diagnosis Date Noted  . Hypoxia 12/15/2017  . COPD exacerbation (HCC) 12/15/2017  . Tachycardia 12/15/2017  . Nicotine dependence 12/15/2017    Past Surgical History:  Procedure Laterality Date  . ABDOMINAL HYSTERECTOMY    . BLADDER SURGERY       OB History   No obstetric history on file.     Family History  Problem Relation Age of Onset  . Heart failure Father     Social History   Tobacco Use  . Smoking status: Current Every Day Smoker    Packs/day: 1.00  . Smokeless tobacco: Never Used  Substance Use Topics  . Alcohol use: No  . Drug use: No    Home Medications Prior to Admission medications   Medication Sig Start Date End Date Taking? Authorizing Provider  albuterol (PROVENTIL HFA;VENTOLIN HFA) 108 (90 Base) MCG/ACT inhaler Inhale 2 puffs into the lungs every 6 (six) hours as needed for  wheezing or shortness of breath. 12/18/17   Albertine Grates, MD  benzonatate (TESSALON) 100 MG capsule Take 1 capsule (100 mg total) by mouth 3 (three) times daily as needed for cough. 12/18/17   Albertine Grates, MD  glipiZIDE (GLUCOTROL XL) 10 MG 24 hr tablet Take 10 mg by mouth daily with breakfast.    [provider]  guaiFENesin (MUCINEX) 600 MG 12 hr tablet Take 1 tablet (600 mg total) by mouth 2 (two) times daily. 12/18/17   Albertine Grates, MD  insulin aspart (NOVOLOG) 100 UNIT/ML injection Inject 5 Units into the skin once. If BS over 250    [provider]  insulin NPH Human (HUMULIN N,NOVOLIN N) 100 UNIT/ML injection Inject 27 Units into the skin 2 (two) times daily before a meal.     [provider]  methocarbamol (ROBAXIN) 500 MG tablet Take 1 tablet (500 mg total) by mouth every 8 (eight) hours as needed. 04/20/19   Petrucelli, Samantha R, PA-C  naproxen (NAPROSYN) 500 MG tablet Take 1 tablet (500 mg total) by mouth 2 (two) times daily. 04/20/19   Petrucelli, Samantha R, PA-C  tamsulosin (FLOMAX) 0.4 MG CAPS capsule Take 1 capsule (0.4 mg total) by mouth daily. 05/11/14   Elson Areas, PA-C    Allergies    Sulfa antibiotics  Review of Systems   Review of Systems  Constitutional: Positive for chills  and fever.  HENT: Negative for rhinorrhea and sore throat.   Eyes: Negative for redness.  Respiratory: Positive for cough. Negative for shortness of breath.   Cardiovascular: Negative for chest pain.  Gastrointestinal: Positive for abdominal pain. Negative for diarrhea, nausea and vomiting.  Genitourinary: Negative for dysuria, frequency, hematuria and urgency.  Musculoskeletal: Negative for myalgias.  Skin: Negative for rash.  Neurological: Negative for headaches.    Physical Exam Updated Vital Signs BP 125/79   Pulse 88   Temp 98.8 F (37.1 C) (Oral)   Resp 18   Ht 5\' 6"  (1.676 m)   Wt (!) 96.2 kg   SpO2 94% Comment: hx COPD  BMI 34.23 kg/m   Physical Exam Vitals  and nursing note reviewed.  Constitutional:      Appearance: She is well-developed.  HENT:     Head: Normocephalic and atraumatic.  Eyes:     General:        Right eye: No discharge.        Left eye: No discharge.     Conjunctiva/sclera: Conjunctivae normal.  Cardiovascular:     Rate and Rhythm: Normal rate and regular rhythm.     Heart sounds: Normal heart sounds.  Pulmonary:     Effort: Pulmonary effort is normal.     Breath sounds: Normal breath sounds.     Comments: Patient with occasional rhonchorous sounding cough during exam. Abdominal:     General: There is no distension.     Palpations: Abdomen is soft.     Tenderness: There is abdominal tenderness in the left upper quadrant and left lower quadrant. There is no guarding or rebound.     Hernia: No hernia is present.  Musculoskeletal:     Cervical back: Normal range of motion and neck supple.     Thoracic back: No tenderness.     Lumbar back: No tenderness.  Skin:    General: Skin is warm and dry.  Neurological:     Mental Status: She is alert.     ED Results / Procedures / Treatments   Labs (all labs ordered are listed, but only abnormal results are displayed) Labs Reviewed  SARS CORONAVIRUS 2 BY RT PCR (HOSPITAL ORDER, PERFORMED IN [facility]) - Abnormal; Notable for the following components:      Result Value   SARS Coronavirus 2 POSITIVE (*)    All other components within normal limits  CBC WITH DIFFERENTIAL/PLATELET - Abnormal; Notable for the following components:   WBC 3.8 (*)    Hemoglobin 15.2 (*)    HCT 46.9 (*)    All other components within normal limits  COMPREHENSIVE METABOLIC PANEL - Abnormal; Notable for the following components:   Sodium 133 (*)    Potassium 3.2 (*)    Glucose, Bld 233 (*)    Calcium 7.6 (*)    Albumin 3.3 (*)    All other components within normal limits  LIPASE, BLOOD  URINALYSIS, ROUTINE W REFLEX MICROSCOPIC    EKG None  Radiology DG Chest Port 1  View  Result Date: 06/16/2020 CLINICAL DATA:  Cough EXAM: PORTABLE CHEST 1 VIEW COMPARISON:  11/01/2019 chest radiograph. FINDINGS: No focal airspace opacity. Mild peribronchial thickening, unchanged. No pneumothorax or pleural effusion. Cardiomediastinal silhouette is within normal limits. No acute osseous abnormality. IMPRESSION: No focal airspace opacity. Mild peribronchial thickening is unchanged and may reflect sequela of bronchitis. Electronically Signed   By: 14/08/2019 M.D.   On: 06/16/2020 13:31  Procedures Procedures (including critical care time)  Medications Ordered in ED Medications - No data to display  ED Course  I have reviewed the triage vital signs and the nursing notes.  Pertinent labs & imaging results that were available during my care of the patient were reviewed by me and considered in my medical decision making (see chart for details).  Patient seen and examined, found laying on her side in the bed.  She looks like she is not feeling well.  She gives brief answers to questions without much detail.  Main complaint is abdominal pain with movement into her back.  It seems to be worse with movement and better when she is lying still.  Abdominal pain is nonfocal.  I cannot exhibit any lower back pain to palpation.  Will check labs placed saline lock.  She declines pain medication at this point.  May need CT imaging.  COVID test ordered by nursing on arrival.   Vital signs reviewed and are as follows: BP 125/79   Pulse 88   Temp 98.8 F (37.1 C) (Oral)   Resp 18   Ht 5\' 6"  (1.676 m)   Wt (!) 96.2 kg   SpO2 94% Comment: hx COPD  BMI 34.23 kg/m   5:00 PM patient had positive Covid test.  The remainder of her work-up is reassuring in regards to her abdominal back pain.  Negative UA.  Feel that she can be discharged home at this time.  Patient educated on need for isolation.  Request for monoclonal antibody treatment sent.  Discussed need to return if  breathing becomes worse or she develops worsening or other severe symptoms.  She verbalizes understanding agrees with plan.  KARRY BARRILLEAUX was evaluated in Emergency Department on 06/16/2020 for the symptoms described in the history of present illness. She was evaluated in the context of the global COVID-19 pandemic, which necessitated consideration that the patient might be at risk for infection with the SARS-CoV-2 virus that causes COVID-19. Institutional protocols and algorithms that pertain to the evaluation of patients at risk for COVID-19 are in a state of rapid change based on information released by regulatory bodies including the CDC and federal and state organizations. These policies and algorithms were followed during the patient's care in the ED.      MDM Rules/Calculators/A&P                           Patient with nonfocal abdominal pain and back pain, positive Covid today.  She does complain of cough and fevers.  Suspect that symptoms are related to positive coronavirus infection.  In regards to abdominal pain. Vitals are stable. Labs reassuring. Imaging CXR neg PNA, abdominal imaging not felt indicated. No signs of dehydration, patient is tolerating PO's. Lungs are clear and no signs suggestive of PNA. Low concern for appendicitis, cholecystitis, pancreatitis, ruptured viscus, UTI, kidney stone, aortic dissection, aortic aneurysm or other emergent abdominal etiology. Supportive therapy indicated with return if symptoms worsen.      Final Clinical Impression(s) / ED Diagnoses Final diagnoses:  COVID-19  Left lower quadrant abdominal pain    Rx / DC Orders ED Discharge Orders    None       06/18/2020, PA-C 06/16/20 1702    Little, 06/18/20, MD 06/19/20 [phone]

## 2020-06-18 ENCOUNTER — Telehealth: Payer: Self-pay | Admitting: Physician Assistant

## 2020-06-18 NOTE — Telephone Encounter (Signed)
Called to discuss with [name] about Covid symptoms and the use of bamlanivimab/etesevimab or casirivimab/imdevimab, a monoclonal antibody infusion for those with mild to moderate Covid symptoms and at a high risk of hospitalization.     Pt is qualified for this infusion at the monoclonal antibody infusion center due to co-morbid conditions and/or a member of an at-risk group, however declines infusion at this time due cost and not having any medical insurance. Symptoms tier reviewed as well as criteria for ending isolation.  Symptoms reviewed that would warrant ED/Hospital evaluation. Preventative practices reviewed. Patient verbalized understanding. Patient advised to call back if he decides that he does want to get infusion. Callback number to the infusion center given. Patient advised to go to Urgent care or ED with severe symptoms.      Patient Active Problem List   Diagnosis Date Noted  . Hypoxia 12/15/2017  . COPD exacerbation (HCC) 12/15/2017  . Tachycardia 12/15/2017  . Nicotine dependence 12/15/2017    Cline Crock PA-C

## 2020-06-19 ENCOUNTER — Other Ambulatory Visit: Payer: Self-pay

## 2020-06-19 ENCOUNTER — Ambulatory Visit: Payer: Self-pay | Admitting: *Deleted

## 2020-06-19 ENCOUNTER — Encounter (HOSPITAL_BASED_OUTPATIENT_CLINIC_OR_DEPARTMENT_OTHER): Payer: Self-pay

## 2020-06-19 ENCOUNTER — Emergency Department (HOSPITAL_BASED_OUTPATIENT_CLINIC_OR_DEPARTMENT_OTHER): Payer: HRSA Program

## 2020-06-19 ENCOUNTER — Inpatient Hospital Stay (HOSPITAL_BASED_OUTPATIENT_CLINIC_OR_DEPARTMENT_OTHER)
Admission: EM | Admit: 2020-06-19 | Discharge: 2020-06-23 | DRG: 177 | Disposition: A | Discharge status: Home / Self Care | Payer: HRSA Program | Attending: Internal Medicine | Admitting: Internal Medicine

## 2020-06-19 DIAGNOSIS — U071 COVID-19: Principal | ICD-10-CM | POA: Diagnosis present

## 2020-06-19 DIAGNOSIS — E876 Hypokalemia: Secondary | ICD-10-CM | POA: Diagnosis present

## 2020-06-19 DIAGNOSIS — R0902 Hypoxemia: Secondary | ICD-10-CM

## 2020-06-19 DIAGNOSIS — Z87442 Personal history of urinary calculi: Secondary | ICD-10-CM

## 2020-06-19 DIAGNOSIS — Z8249 Family history of ischemic heart disease and other diseases of the circulatory system: Secondary | ICD-10-CM

## 2020-06-19 DIAGNOSIS — F17210 Nicotine dependence, cigarettes, uncomplicated: Secondary | ICD-10-CM | POA: Diagnosis present

## 2020-06-19 DIAGNOSIS — J440 Chronic obstructive pulmonary disease with acute lower respiratory infection: Secondary | ICD-10-CM | POA: Diagnosis present

## 2020-06-19 DIAGNOSIS — Z794 Long term (current) use of insulin: Secondary | ICD-10-CM

## 2020-06-19 DIAGNOSIS — E119 Type 2 diabetes mellitus without complications: Secondary | ICD-10-CM | POA: Diagnosis present

## 2020-06-19 DIAGNOSIS — J1282 Pneumonia due to coronavirus disease 2019: Secondary | ICD-10-CM | POA: Diagnosis present

## 2020-06-19 DIAGNOSIS — Z90710 Acquired absence of both cervix and uterus: Secondary | ICD-10-CM

## 2020-06-19 DIAGNOSIS — Z79899 Other long term (current) drug therapy: Secondary | ICD-10-CM

## 2020-06-19 DIAGNOSIS — J9601 Acute respiratory failure with hypoxia: Secondary | ICD-10-CM | POA: Diagnosis present

## 2020-06-19 LAB — COMPREHENSIVE METABOLIC PANEL
ALT: 40 U/L (ref 0–44)
AST: 42 U/L — ABNORMAL HIGH (ref 15–41)
Albumin: 3.3 g/dL — ABNORMAL LOW (ref 3.5–5.0)
Alkaline Phosphatase: 91 U/L (ref 38–126)
Anion gap: 12 (ref 5–15)
BUN: 12 mg/dL (ref 6–20)
CO2: 25 mmol/L (ref 22–32)
Calcium: 8.1 mg/dL — ABNORMAL LOW (ref 8.9–10.3)
Chloride: 100 mmol/L (ref 98–111)
Creatinine, Ser: 0.76 mg/dL (ref 0.44–1.00)
GFR calc Af Amer: >60 mL/min (ref >60)
GFR calc non Af Amer: >60 mL/min (ref >60)
Glucose, Bld: 185 mg/dL — ABNORMAL HIGH (ref 70–99)
Potassium: 3.4 mmol/L — ABNORMAL LOW (ref 3.5–5.1)
Sodium: 137 mmol/L (ref 135–145)
Total Bilirubin: 0.5 mg/dL (ref 0.3–1.2)
Total Protein: 7.1 g/dL (ref 6.5–8.1)

## 2020-06-19 LAB — CBC WITH DIFFERENTIAL/PLATELET
Abs Immature Granulocytes: 0 10*3/uL (ref 0.00–0.07)
Basophils Absolute: 0 10*3/uL (ref 0.0–0.1)
Basophils Relative: 0 %
Eosinophils Absolute: 0.1 10*3/uL (ref 0.0–0.5)
Eosinophils Relative: 2 %
HCT: 47.3 % — ABNORMAL HIGH (ref 36.0–46.0)
Hemoglobin: 15.6 g/dL — ABNORMAL HIGH (ref 12.0–15.0)
Lymphocytes Relative: 31 %
Lymphs Abs: 1.7 10*3/uL (ref 0.7–4.0)
MCH: 30.5 pg (ref 26.0–34.0)
MCHC: 33 g/dL (ref 30.0–36.0)
MCV: 92.4 fL (ref 80.0–100.0)
Monocytes Absolute: 0 10*3/uL — ABNORMAL LOW (ref 0.1–1.0)
Monocytes Relative: 0 %
Neutro Abs: 3.8 10*3/uL (ref 1.7–7.7)
Neutrophils Relative %: 67 %
Platelets: 174 10*3/uL (ref 150–400)
RBC: 5.12 MIL/uL — ABNORMAL HIGH (ref 3.87–5.11)
RDW: 14 % (ref 11.5–15.5)
WBC Morphology: ABNORMAL
WBC: 5.6 10*3/uL (ref 4.0–10.5)
nRBC: 0 % (ref 0.0–0.2)

## 2020-06-19 LAB — FERRITIN: Ferritin: 589 ng/mL — ABNORMAL HIGH (ref 11–307)

## 2020-06-19 LAB — C-REACTIVE PROTEIN: CRP: 0.7 mg/dL (ref <1.0)

## 2020-06-19 LAB — LACTIC ACID, PLASMA: Lactic Acid, Venous: 1.6 mmol/L (ref 0.5–1.9)

## 2020-06-19 LAB — PREGNANCY, URINE: Preg Test, Ur: NEGATIVE

## 2020-06-19 LAB — LACTATE DEHYDROGENASE: LDH: 331 U/L — ABNORMAL HIGH (ref 98–192)

## 2020-06-19 LAB — TRIGLYCERIDES: Triglycerides: 126 mg/dL (ref <150)

## 2020-06-19 LAB — PROCALCITONIN: Procalcitonin: <0.1 ng/mL

## 2020-06-19 LAB — FIBRINOGEN: Fibrinogen: 464 mg/dL (ref 210–475)

## 2020-06-19 LAB — D-DIMER, QUANTITATIVE: D-Dimer, Quant: 0.77 ug/mL-FEU — ABNORMAL HIGH (ref 0.00–0.50)

## 2020-06-19 MED ORDER — DEXAMETHASONE SODIUM PHOSPHATE 10 MG/ML IJ SOLN
10.0000 mg | Freq: Once | INTRAMUSCULAR | Status: AC
  Administered 2020-06-19: 10 mg via INTRAVENOUS
  Filled 2020-06-19: qty 1

## 2020-06-19 MED ORDER — PIOGLITAZONE HCL 45 MG PO TABS
45.0000 mg | ORAL_TABLET | Freq: Every day | ORAL | Status: DC
  Filled 2020-06-19: qty 1

## 2020-06-19 MED ORDER — SODIUM CHLORIDE 0.9 % IV SOLN
100.0000 mg | Freq: Every day | INTRAVENOUS | Status: DC
  Administered 2020-06-20: 100 mg via INTRAVENOUS
  Filled 2020-06-19: qty 20

## 2020-06-19 MED ORDER — ATORVASTATIN CALCIUM 10 MG PO TABS
20.0000 mg | ORAL_TABLET | Freq: Every day | ORAL | Status: DC
  Administered 2020-06-20 – 2020-06-23 (×4): 20 mg via ORAL
  Filled 2020-06-19 (×4): qty 2

## 2020-06-19 MED ORDER — ALBUTEROL SULFATE HFA 108 (90 BASE) MCG/ACT IN AERS
2.0000 | INHALATION_SPRAY | Freq: Four times a day (QID) | RESPIRATORY_TRACT | Status: DC | PRN
  Filled 2020-06-19: qty 6.7

## 2020-06-19 MED ORDER — SODIUM CHLORIDE 0.9 % IV SOLN
100.0000 mg | INTRAVENOUS | Status: AC
  Administered 2020-06-19 (×2): 100 mg via INTRAVENOUS
  Filled 2020-06-19 (×2): qty 20

## 2020-06-19 NOTE — ED Notes (Signed)
Beside commode in room per pt request

## 2020-06-19 NOTE — Telephone Encounter (Signed)
Pt called in on the community line c/o being very short of breath.   She was seen Monday at [facility] and diagnosed with COVID-19.   The shortness of breath has gotten worse.   She has a dry cough.   She has COPD but her pulse ox readings stay above 95% now they are 86-90% and her respirations are rapid and she can only say 4-5 words at a time.  See triage notes.  I have instructed her to return to the ED.  She is going back to the ED she went  to before.  She is going to drive herself.   "I'm maybe 5 miles from there".   I advised her to call 911 since she said she didn't have anyone that could take her.  "I'm ok to drive". Chooses not to call 911.   Reason for Disposition . [1] MODERATE difficulty breathing (e.g., speaks in phrases, SOB even at rest, pulse 100-120) AND [2] NEW-onset or WORSE than normal    Has COVID-19  Answer Assessment - Initial Assessment Questions 1. RESPIRATORY STATUS: "Describe your breathing?" (e.g., wheezing, shortness of breath, unable to speak, severe coughing)      I diagnosed with COVID-19 Monday.  I'm short of breath now.  I have pulse Ox it's 86-90%.  I have COPD it's usually above 95% 2. ONSET: "When did this breathing problem begin?"      Dry coughing too.   The shortness of breath is getting worse.  (In talking with her she is very short of breath) 3. PATTERN "Does the difficult breathing come and go, or has it been constant since it started?"      Constant. 4. SEVERITY: "How bad is your breathing?" (e.g., mild, moderate, severe)    - MILD: No SOB at rest, mild SOB with walking, speaks normally in sentences, can lay down, no retractions, pulse < 100.    - MODERATE: SOB at rest, SOB with minimal exertion and prefers to sit, cannot lie down flat, speaks in phrases, mild retractions, audible wheezing, pulse 100-120.    - SEVERE: Very SOB at rest, speaks in single words, struggling to breathe, sitting hunched forward, retractions, pulse >  120      Severe  Has to take a breath every 4-5 words and her respirations are rapid.  5. RECURRENT SYMPTOM: "Have you had difficulty breathing before?" If Yes, ask: "When was the last time?" and "What happened that time?"      I have COPD but this is very bad for me. 6. CARDIAC HISTORY: "Do you have any history of heart disease?" (e.g., heart attack, angina, bypass surgery, angioplasty)      Not asked.   I referred her on to the ED 7. LUNG HISTORY: "Do you have any history of lung disease?"  (e.g., pulmonary embolus, asthma, emphysema)     COPD 8. CAUSE: "What do you think is causing the breathing problem?"      COVID-19 9. OTHER SYMPTOMS: "Do you have any other symptoms? (e.g., dizziness, runny nose, cough, chest pain, fever)     Denies chest tightness/pain.   Has a dry cough 10. PREGNANCY: "Is there any chance you are pregnant?" "When was your last menstrual period?"       Not asked 11. TRAVEL: "Have you traveled out of the country in the last month?" (e.g., travel history, exposures)       No   She is positive for COVID-19.  Protocols used:  BREATHING DIFFICULTY-A-AH

## 2020-06-19 NOTE — ED Notes (Signed)
Oxygen 3L applied per provider request.  Pt sat drops to 88% when sleeping on room air.  Hx copd, pt not oxygen dependent prior to arrival.

## 2020-06-19 NOTE — Progress Notes (Signed)
BP 116/73 Comment: 4L  Pulse 76 Comment: 4L  Temp 98.4 F (36.9 C) (Oral)   Resp (!) 27 Comment: 4L  SpO2 96% Comment: 18L 59 year old female with past medical history of COPD diabetes mellitus type 2 presents to the emergency department with shortness of breath that started about 4 days prior to admission after being tested for COVID-19.  Presented to the ED was found to be hypoxic requiring 4 L of oxygen admitted to MedSurg already started on Remdesivir and steroids.

## 2020-06-19 NOTE — ED Notes (Signed)
RT assessed patient in triage. BBS slightly diminished by no wheezing. Strong non-productive cough noted. SAT 90%. Will assess further when roomed if needed

## 2020-06-19 NOTE — ED Triage Notes (Signed)
Pt c/o SOB started last night with recent +covid-to triage in w/c

## 2020-06-19 NOTE — ED Provider Notes (Signed)
[facility] EMERGENCY DEPARTMENT Provider Note   CSN: [id number] Arrival date & time: 06/19/20  1321     History Chief Complaint  Patient presents with  . Shortness of Breath    +COVID    Jill Barnes is a 59 y.o. female with a past medical history of COPD, diabetes the presents emergency department today for shortness of breath after being diagnosed with Covid 3 days ago.  Patient states that her symptoms started 4 days ago, has been having chills, fevers, cough, shortness of breath, diarrhea.  States that her cough has been nonproductive, no hemoptysis.  No nausea, vomiting.  States that she does not need to take any medications according to her pulmonologist for her COPD.  States that she is not on oxygen at home.  States that she normally lives around 96 %  on room air.  Denies any chest pain, back pain.  States that shortness breath worsened last night, therefore she came into the ER today.  Has not been taking anything for this.  HPI     Past Medical History:  Diagnosis Date  . COPD (chronic obstructive pulmonary disease) (HCC)   . Diabetes mellitus without complication (HCC)   . Kidney stone     Patient Active Problem List   Diagnosis Date Noted  . Acute respiratory failure with hypoxia (HCC) 06/19/2020  . Hypoxia 12/15/2017  . COPD exacerbation (HCC) 12/15/2017  . Tachycardia 12/15/2017  . Nicotine dependence 12/15/2017    Past Surgical History:  Procedure Laterality Date  . ABDOMINAL HYSTERECTOMY    . BLADDER SURGERY       OB History   No obstetric history on file.     Family History  Problem Relation Age of Onset  . Heart failure Father     Social History   Tobacco Use  . Smoking status: Current Every Day Smoker    Packs/day: 1.00  . Smokeless tobacco: Never Used  Vaping Use  . Vaping Use: Never used  Substance Use Topics  . Alcohol use: No  . Drug use: No    Home Medications Prior to Admission medications   Medication Sig  Start Date End Date Taking? Authorizing Provider  albuterol (PROVENTIL HFA;VENTOLIN HFA) 108 (90 Base) MCG/ACT inhaler Inhale 2 puffs into the lungs every 6 (six) hours as needed for wheezing or shortness of breath. 12/18/17   Albertine Grates, MD  atorvastatin (LIPITOR) 20 MG tablet Take 20 mg by mouth daily. 05/21/20   [provider]  benzonatate (TESSALON) 100 MG capsule Take 1 capsule (100 mg total) by mouth 3 (three) times daily as needed for cough. 12/18/17   Albertine Grates, MD  glipiZIDE (GLUCOTROL XL) 10 MG 24 hr tablet Take 10 mg by mouth daily with breakfast.    [provider]  guaiFENesin (MUCINEX) 600 MG 12 hr tablet Take 1 tablet (600 mg total) by mouth 2 (two) times daily. 12/18/17   Albertine Grates, MD  insulin aspart (NOVOLOG) 100 UNIT/ML injection Inject 5 Units into the skin once. If BS over 250    [provider]  insulin NPH Human (HUMULIN N,NOVOLIN N) 100 UNIT/ML injection Inject 27 Units into the skin 2 (two) times daily before a meal.     [provider]  methocarbamol (ROBAXIN) 500 MG tablet Take 1 tablet (500 mg total) by mouth every 8 (eight) hours as needed. 04/20/19   Petrucelli, Samantha R, PA-C  naproxen (NAPROSYN) 500 MG tablet Take 1 tablet (500 mg total)  by mouth 2 (two) times daily. 04/20/19   Petrucelli, Samantha R, PA-C  pioglitazone (ACTOS) 45 MG tablet Take 45 mg by mouth daily. 04/26/20   [provider]  tamsulosin (FLOMAX) 0.4 MG CAPS capsule Take 1 capsule (0.4 mg total) by mouth daily. 05/11/14   Elson AreasSofia, Leslie K, PA-C    Allergies    Sulfa antibiotics  Review of Systems   Review of Systems  Constitutional: Positive for chills and fever. Negative for diaphoresis and fatigue.  HENT: Negative for congestion, dental problem, facial swelling, sinus pressure, sore throat and trouble swallowing.   Eyes: Negative for pain and visual disturbance.  Respiratory: Positive for cough and shortness of breath. Negative for wheezing.   Cardiovascular:  Negative for chest pain, palpitations and leg swelling.  Gastrointestinal: Positive for diarrhea. Negative for abdominal distention, abdominal pain, nausea and vomiting.  Genitourinary: Negative for difficulty urinating and flank pain.  Musculoskeletal: Positive for myalgias. Negative for back pain, neck pain and neck stiffness.  Skin: Negative for pallor.  Neurological: Negative for dizziness, facial asymmetry, speech difficulty, weakness, light-headedness, numbness and headaches.  Psychiatric/Behavioral: Negative for confusion.    Physical Exam Updated Vital Signs BP 116/73 Comment: 4L  Pulse 79   Temp 98.4 F (36.9 C) (Oral)   Resp 20   SpO2 96%   Physical Exam Constitutional:      General: She is not in acute distress.    Appearance: Normal appearance. She is not ill-appearing, toxic-appearing or diaphoretic.  HENT:     Head: Normocephalic and atraumatic.     Mouth/Throat:     Mouth: Mucous membranes are moist.     Pharynx: Oropharynx is clear.  Eyes:     General: No scleral icterus.    Extraocular Movements: Extraocular movements intact.     Pupils: Pupils are equal, round, and reactive to light.  Cardiovascular:     Rate and Rhythm: Normal rate and regular rhythm.     Pulses: Normal pulses.     Heart sounds: Normal heart sounds.  Pulmonary:     Effort: Pulmonary effort is normal. Tachypnea present. No respiratory distress.     Breath sounds: No stridor. Rales present. No wheezing or rhonchi.     Comments: Patient with increased respirations on exam, is not in respiratory distress.  Is able to speak to me in full sentences.  No accessory muscle usage.  Patient is at 92% on room air, does improve to 96%  On 3L.  No stridor.  Is handling secretions well. Chest:     Chest wall: No tenderness.  Abdominal:     General: Abdomen is flat. There is no distension.     Palpations: Abdomen is soft.     Tenderness: There is no abdominal tenderness. There is no guarding or rebound.   Musculoskeletal:        General: No swelling or tenderness. Normal range of motion.     Cervical back: Normal range of motion and neck supple. No rigidity.     Right lower leg: No edema.     Left lower leg: No edema.  Skin:    General: Skin is warm and dry.     Capillary Refill: Capillary refill takes less than 2 seconds.     Coloration: Skin is not pale.  Neurological:     General: No focal deficit present.     Mental Status: She is alert and oriented to person, place, and time.  Psychiatric:  Mood and Affect: Mood normal.        Behavior: Behavior normal.     ED Results / Procedures / Treatments   Labs (all labs ordered are listed, but only abnormal results are displayed) Labs Reviewed  CBC WITH DIFFERENTIAL/PLATELET - Abnormal; Notable for the following components:      Result Value   RBC 5.12 (*)    Hemoglobin 15.6 (*)    HCT 47.3 (*)    Monocytes Absolute 0.0 (*)    All other components within normal limits  COMPREHENSIVE METABOLIC PANEL - Abnormal; Notable for the following components:   Potassium 3.4 (*)    Glucose, Bld 185 (*)    Calcium 8.1 (*)    Albumin 3.3 (*)    AST 42 (*)    All other components within normal limits  D-DIMER, QUANTITATIVE (NOT AT [facility]) - Abnormal; Notable for the following components:   D-Dimer, Quant 0.77 (*)    All other components within normal limits  CULTURE, BLOOD (ROUTINE X 2)  CULTURE, BLOOD (ROUTINE X 2)  LACTIC ACID, PLASMA  LACTIC ACID, PLASMA  PROCALCITONIN  LACTATE DEHYDROGENASE  TRIGLYCERIDES  FIBRINOGEN  PREGNANCY, URINE  C-REACTIVE PROTEIN  FERRITIN    EKG EKG Interpretation  Date/Time:  Thursday June 19 2020 15:49:27 EDT Ventricular Rate:  80 PR Interval:    QRS Duration: 90 QT Interval:  385 QTC Calculation: 445 R Axis:   76 Text Interpretation: Sinus rhythm Minimal ST depression, inferior leads Confirmed by Virgina Norfolk [phone]) on 06/19/2020 4:11:40 PM   Radiology DG Chest Port 1  View  Result Date: 06/19/2020 CLINICAL DATA:  Shortness of breath.  COVID-19 positive. EXAM: PORTABLE CHEST 1 VIEW COMPARISON:  June 16, 2020. FINDINGS: The heart size and mediastinal contours are within normal limits. No pneumothorax or pleural effusion is noted. Possible right basilar opacity is noted concerning for pneumonia. Multiple airspace opacities are noted in the left lung concerning for multifocal pneumonia. The visualized skeletal structures are unremarkable. IMPRESSION: Findings consistent with bilateral and multifocal pneumonia, left greater than right. Electronically Signed   By: Lupita Raider M.D.   On: 06/19/2020 15:38    Procedures Procedures (including critical care time)  Medications Ordered in ED Medications  remdesivir 100 mg in sodium chloride 0.9 % 100 mL IVPB (100 mg Intravenous New Bag/Given 06/19/20 1719)  remdesivir 100 mg in sodium chloride 0.9 % 100 mL IVPB (has no administration in time range)  dexamethasone (DECADRON) injection 10 mg (10 mg Intravenous Given 06/19/20 1637)    ED Course  I have reviewed the triage vital signs and the nursing notes.  Pertinent labs & imaging results that were available during my care of the patient were reviewed by me and considered in my medical decision making (see chart for details).    MDM Rules/Calculators/A&P                         Jill Barnes is a 59 y.o. female with a past medical history of COPD, diabetes the presents emergency department today for shortness of breath after being diagnosed with Covid 3 days ago.  No respiratory distress on exam, however patient does have new oxygen requirement.  Patient satting at 91% on room air, states that she normally lives at 96% on COPD.  Not on oxygen at home.  Requires 3 L of oxygen to maintain oxygenation at 96%.  Will need to be admitted for this.  Will consult  hospitalist.  CBC and CMP acutely normal.  Chest x-ray does show multifocal pneumonia.  Will start remdesivir and  Decadron at this time. Denies chest pain.   500 spoke to hospitalist, Dr. Katherine Mantle who will accept the patient.  States that patient be transferred to [facility], states that he will put in admitting orders.  The patient appears reasonably stabilized for admission considering the current resources, flow, and capabilities available in the ED at this time, and I doubt any other [facility] requiring further screening and/or treatment in the ED prior to admission.  I discussed this case with my attending physician who cosigned this note including patient's presenting symptoms, physical exam, and planned diagnostics and interventions. Attending physician stated agreement with plan or made changes to plan which were implemented.   Attending physician assessed patient at bedside.  Final Clinical Impression(s) / ED Diagnoses Final diagnoses:  Pneumonia due to COVID-19 virus  Hypoxia    Rx / DC Orders ED Discharge Orders    None       Farrel Gordon, PA-C 06/19/20 1740    Terald Sleeper, MD 06/19/20 [phone]

## 2020-06-20 DIAGNOSIS — Z90710 Acquired absence of both cervix and uterus: Secondary | ICD-10-CM | POA: Diagnosis not present

## 2020-06-20 DIAGNOSIS — E876 Hypokalemia: Secondary | ICD-10-CM

## 2020-06-20 DIAGNOSIS — J9601 Acute respiratory failure with hypoxia: Secondary | ICD-10-CM | POA: Diagnosis present

## 2020-06-20 DIAGNOSIS — Z79899 Other long term (current) drug therapy: Secondary | ICD-10-CM | POA: Diagnosis not present

## 2020-06-20 DIAGNOSIS — Z794 Long term (current) use of insulin: Secondary | ICD-10-CM | POA: Diagnosis not present

## 2020-06-20 DIAGNOSIS — F17210 Nicotine dependence, cigarettes, uncomplicated: Secondary | ICD-10-CM | POA: Diagnosis present

## 2020-06-20 DIAGNOSIS — U071 COVID-19: Secondary | ICD-10-CM | POA: Diagnosis present

## 2020-06-20 DIAGNOSIS — E119 Type 2 diabetes mellitus without complications: Secondary | ICD-10-CM | POA: Diagnosis present

## 2020-06-20 DIAGNOSIS — J440 Chronic obstructive pulmonary disease with acute lower respiratory infection: Secondary | ICD-10-CM | POA: Diagnosis present

## 2020-06-20 DIAGNOSIS — R0902 Hypoxemia: Secondary | ICD-10-CM | POA: Diagnosis present

## 2020-06-20 DIAGNOSIS — J1282 Pneumonia due to coronavirus disease 2019: Secondary | ICD-10-CM | POA: Diagnosis present

## 2020-06-20 DIAGNOSIS — Z8249 Family history of ischemic heart disease and other diseases of the circulatory system: Secondary | ICD-10-CM | POA: Diagnosis not present

## 2020-06-20 DIAGNOSIS — Z87442 Personal history of urinary calculi: Secondary | ICD-10-CM | POA: Diagnosis not present

## 2020-06-20 LAB — CBC
HCT: 46.1 % — ABNORMAL HIGH (ref 36.0–46.0)
Hemoglobin: 14.9 g/dL (ref 12.0–15.0)
MCH: 30.2 pg (ref 26.0–34.0)
MCHC: 32.3 g/dL (ref 30.0–36.0)
MCV: 93.3 fL (ref 80.0–100.0)
Platelets: 215 10*3/uL (ref 150–400)
RBC: 4.94 MIL/uL (ref 3.87–5.11)
RDW: 13.9 % (ref 11.5–15.5)
WBC: 9.1 10*3/uL (ref 4.0–10.5)
nRBC: 0 % (ref 0.0–0.2)

## 2020-06-20 LAB — CBG MONITORING, ED: Glucose-Capillary: 273 mg/dL — ABNORMAL HIGH (ref 70–99)

## 2020-06-20 LAB — CREATININE, SERUM
Creatinine, Ser: 0.9 mg/dL (ref 0.44–1.00)
GFR calc Af Amer: >60 mL/min (ref >60)
GFR calc non Af Amer: >60 mL/min (ref >60)

## 2020-06-20 MED ORDER — ENOXAPARIN SODIUM 40 MG/0.4ML ~~LOC~~ SOLN
40.0000 mg | SUBCUTANEOUS | Status: DC
  Administered 2020-06-20 – 2020-06-22 (×3): 40 mg via SUBCUTANEOUS
  Filled 2020-06-20 (×3): qty 0.4

## 2020-06-20 MED ORDER — POTASSIUM CHLORIDE CRYS ER 20 MEQ PO TBCR
20.0000 meq | EXTENDED_RELEASE_TABLET | Freq: Once | ORAL | Status: AC
  Administered 2020-06-20: 20 meq via ORAL
  Filled 2020-06-20: qty 1

## 2020-06-20 MED ORDER — HYDROCOD POLST-CPM POLST ER 10-8 MG/5ML PO SUER: 5.0000 mL | Freq: Two times a day (BID) | ORAL | Status: DC | PRN

## 2020-06-20 MED ORDER — INSULIN ASPART 100 UNIT/ML ~~LOC~~ SOLN
0.0000 [IU] | SUBCUTANEOUS | Status: DC
  Administered 2020-06-20: 8 [IU] via SUBCUTANEOUS
  Administered 2020-06-21: 5 [IU] via SUBCUTANEOUS
  Administered 2020-06-21: 11 [IU] via SUBCUTANEOUS
  Administered 2020-06-21: 8 [IU] via SUBCUTANEOUS

## 2020-06-20 MED ORDER — IPRATROPIUM-ALBUTEROL 20-100 MCG/ACT IN AERS
1.0000 | INHALATION_SPRAY | Freq: Four times a day (QID) | RESPIRATORY_TRACT | Status: DC
  Administered 2020-06-21 – 2020-06-23 (×9): 1 via RESPIRATORY_TRACT
  Filled 2020-06-20: qty 4

## 2020-06-20 MED ORDER — INSULIN DETEMIR 100 UNIT/ML ~~LOC~~ SOLN
0.1500 [IU]/kg | Freq: Two times a day (BID) | SUBCUTANEOUS | Status: DC
  Administered 2020-06-20 – 2020-06-21 (×2): 14 [IU] via SUBCUTANEOUS
  Filled 2020-06-20 (×3): qty 0.14

## 2020-06-20 MED ORDER — POLYETHYLENE GLYCOL 3350 17 G PO PACK: 17.0000 g | PACK | Freq: Every day | ORAL | Status: DC | PRN

## 2020-06-20 MED ORDER — SODIUM CHLORIDE 0.9 % IV SOLN
200.0000 mg | Freq: Once | INTRAVENOUS | Status: AC
  Administered 2020-06-20: 200 mg via INTRAVENOUS
  Filled 2020-06-20: qty 40

## 2020-06-20 MED ORDER — LINAGLIPTIN 5 MG PO TABS
5.0000 mg | ORAL_TABLET | Freq: Every day | ORAL | Status: DC
  Administered 2020-06-21 – 2020-06-23 (×3): 5 mg via ORAL
  Filled 2020-06-20 (×3): qty 1

## 2020-06-20 MED ORDER — GUAIFENESIN-DM 100-10 MG/5ML PO SYRP: 10.0000 mL | ORAL_SOLUTION | ORAL | Status: DC | PRN

## 2020-06-20 MED ORDER — METHYLPREDNISOLONE SODIUM SUCC 125 MG IJ SOLR
50.0000 mg | Freq: Two times a day (BID) | INTRAMUSCULAR | Status: DC
  Administered 2020-06-20 – 2020-06-23 (×6): 50 mg via INTRAVENOUS
  Filled 2020-06-20 (×6): qty 2

## 2020-06-20 MED ORDER — SODIUM CHLORIDE 0.9 % IV SOLN
100.0000 mg | Freq: Every day | INTRAVENOUS | Status: AC
  Administered 2020-06-21 – 2020-06-23 (×3): 100 mg via INTRAVENOUS
  Filled 2020-06-20 (×4): qty 20

## 2020-06-20 MED ORDER — ACETAMINOPHEN 325 MG PO TABS: 650.0000 mg | ORAL_TABLET | Freq: Four times a day (QID) | ORAL | Status: DC | PRN

## 2020-06-20 NOTE — ED Notes (Signed)
Attempted to call charge nurse for report.  Call went directly to unset-up voice mail.

## 2020-06-20 NOTE — H&P (Signed)
History and Physical        Hospital Admission Note Date: 06/20/2020  Patient name: Jill Barnes Medical record number: [id number] Date of birth: 05/25/1961 Age: 59 y.o. Gender: female  PCP: Patient, No Pcp Per  Patient coming from: Home Lives with: Husband At baseline, ambulates: Independently  Chief Complaint    Chief Complaint  Patient presents with  . Shortness of Breath    +COVID      HPI:   This is a 59 year old female with past medical history of COPD not on home oxygen, type 2 diabetes who presented to [facility] on 7/29 for shortness of breath after being diagnosed with COVID-19 about 4 days ago.  Symptoms started roughly 5 days ago with chills, fevers, nonproductive cough, shortness of breath and diarrhea as well as malaise and myalgias.  Noted to have a new oxygen requirement at [facility] requiring 3 to 4 L.  Has not received the COVID-19 vaccines  ED Course: Afebrile and hemodynamically stable requiring 3 to 4 L nasal cannula.  Notable labs: COVID-19 positive, K3.4, glucose 185, AST 42, LDH 331, CRP 0.7, procalcitonin negative, Hb 15.6, D-dimer 0.77.  She was started on remdesivir and dexamethasone and transferred to [facility] for admission.  Currently in the ED requiring 4 L but otherwise doing well and states her symptoms overall have resolved with the exception of shortness of breath.  Vitals:   06/20/20 1412 06/20/20 1545  BP: 102/77 (!) 103/60  Pulse: 71 71  Resp: 18 23  Temp:    SpO2: 91% 96%     Review of Systems:  Review of Systems  Constitutional: Negative for chills and fever.  HENT: Negative.   Eyes: Negative.   Respiratory: Positive for cough and shortness of breath. Negative for wheezing.   Cardiovascular: Negative for chest pain and leg swelling.  Gastrointestinal: Negative for nausea and vomiting.  Musculoskeletal: Negative.  Negative for myalgias.    Neurological: Negative.   All other systems reviewed and are negative.   Medical/Social/Family History   Past Medical History: Past Medical History:  Diagnosis Date  . COPD (chronic obstructive pulmonary disease) (HCC)   . Diabetes mellitus without complication (HCC)   . Kidney stone     Past Surgical History:  Procedure Laterality Date  . ABDOMINAL HYSTERECTOMY    . BLADDER SURGERY      Medications: Prior to Admission medications   Medication Sig Start Date End Date Taking? Authorizing Provider  albuterol (PROVENTIL HFA;VENTOLIN HFA) 108 (90 Base) MCG/ACT inhaler Inhale 2 puffs into the lungs every 6 (six) hours as needed for wheezing or shortness of breath. 12/18/17  Yes Albertine Grates, MD  atorvastatin (LIPITOR) 20 MG tablet Take 20 mg by mouth daily. 05/21/20  Yes [provider]  glipiZIDE (GLUCOTROL XL) 10 MG 24 hr tablet Take 10 mg by mouth daily with breakfast.   Yes [provider]  ibuprofen (ADVIL) 200 MG tablet Take 800 mg by mouth every 6 (six) hours as needed for fever or mild pain.   Yes [provider]  insulin NPH-regular Human (NOVOLIN 70/30 RELION) (70-30) 100 UNIT/ML injection Inject 15 Units into the skin in the morning and at bedtime.   Yes [provider]  pioglitazone (ACTOS) 45 MG tablet Take 45 mg by mouth daily. 04/26/20  Yes [provider]  benzonatate (TESSALON) 100 MG capsule Take 1 capsule (100 mg total) by mouth 3 (three) times daily as needed for cough. Patient not taking: Reported on 06/20/2020 12/18/17   Albertine Grates, MD  guaiFENesin (MUCINEX) 600 MG 12 hr tablet Take 1 tablet (600 mg total) by mouth 2 (two) times daily. Patient not taking: Reported on 06/20/2020 12/18/17   Albertine Grates, MD  methocarbamol (ROBAXIN) 500 MG tablet Take 1 tablet (500 mg total) by mouth every 8 (eight) hours as needed. Patient not taking: Reported on 06/20/2020 04/20/19   Petrucelli, Lelon Mast R, PA-C  naproxen (NAPROSYN) 500 MG tablet Take 1  tablet (500 mg total) by mouth 2 (two) times daily. Patient not taking: Reported on 06/20/2020 04/20/19   Petrucelli, Pleas Koch, PA-C  tamsulosin (FLOMAX) 0.4 MG CAPS capsule Take 1 capsule (0.4 mg total) by mouth daily. Patient not taking: Reported on 06/20/2020 05/11/14   Elson Areas, PA-C    Allergies:   Allergies  Allergen Reactions  . Sulfa Antibiotics Shortness Of Breath and Swelling    All-over swelling  . Penicillins Rash    Social History:  reports that she has been smoking. She has been smoking about 1.00 pack per day. She has never used smokeless tobacco. She reports that she does not drink alcohol and does not use drugs.  Family History: Family History  Problem Relation Age of Onset  . Heart failure Father      Objective   Physical Exam: Blood pressure (!) 103/60, pulse 71, temperature (!) 97.5 F (36.4 C), temperature source Oral, resp. rate 23, height 5\' 6"  (1.676 m), SpO2 96 %.  Physical Exam Vitals and nursing note reviewed.  Constitutional:      Appearance: Normal appearance.  HENT:     Head: Normocephalic and atraumatic.  Eyes:     Conjunctiva/sclera: Conjunctivae normal.  Cardiovascular:     Rate and Rhythm: Normal rate and regular rhythm.  Pulmonary:     Effort: Pulmonary effort is normal. No accessory muscle usage.  Abdominal:     General: Abdomen is flat.     Palpations: Abdomen is soft.  Musculoskeletal:        General: No swelling or tenderness.  Skin:    Coloration: Skin is not jaundiced or pale.  Neurological:     Mental Status: She is alert. Mental status is at baseline.  Psychiatric:        Mood and Affect: Mood normal.        Behavior: Behavior normal.     LABS on Admission: I have personally reviewed all the labs and imaging below    Basic Metabolic Panel: Recent Labs  Lab 06/16/20 1336 06/19/20 1538  NA 133* 137  K 3.2* 3.4*  CL 98 100  CO2 25 25  GLUCOSE 233* 185*  BUN 13 12  CREATININE 0.88 0.76  CALCIUM 7.6*  8.1*   Liver Function Tests: Recent Labs  Lab 06/16/20 1336 06/19/20 1538  AST 33 42*  ALT 33 40  ALKPHOS 101 91  BILITOT 0.4 0.5  PROT 7.1 7.1  ALBUMIN 3.3* 3.3*   Recent Labs  Lab 06/16/20 1336  LIPASE 28   No results for input(s): AMMONIA in the last 168 hours. CBC: Recent Labs  Lab 06/16/20 1336 06/16/20 1336 06/19/20 1538  WBC 3.8*  --  5.6  NEUTROABS 2.3   < > 3.8  HGB 15.2*  --  15.6*  HCT 46.9*  --  47.3*  MCV 94.7   < > 92.4  PLT 151  --  174   < > = values in this interval not displayed.   Cardiac Enzymes: No results for input(s): CKTOTAL, CKMB, CKMBINDEX, TROPONINI in the last 168 hours. BNP: Invalid input(s): POCBNP CBG: No results for input(s): GLUCAP in the last 168 hours.  Radiological Exams on Admission:  DG Chest Port 1 View  Result Date: 06/19/2020 CLINICAL DATA:  Shortness of breath.  COVID-19 positive. EXAM: PORTABLE CHEST 1 VIEW COMPARISON:  June 16, 2020. FINDINGS: The heart size and mediastinal contours are within normal limits. No pneumothorax or pleural effusion is noted. Possible right basilar opacity is noted concerning for pneumonia. Multiple airspace opacities are noted in the left lung concerning for multifocal pneumonia. The visualized skeletal structures are unremarkable. IMPRESSION: Findings consistent with bilateral and multifocal pneumonia, left greater than right. Electronically Signed   By: Lupita Raider M.D.   On: 06/19/2020 15:38      EKG: Independently reviewed.  NSR with no pathologic ST changes, improved compared to prior   A & P   Principal Problem:   Acute hypoxemic respiratory failure due to COVID-19 [facility]) Active Problems:   Acute respiratory failure with hypoxia (HCC)   Type 2 diabetes mellitus without complication (HCC)   Hypokalemia   1. Acute hypoxic respiratory failure secondary to COVID-19 in setting of COPD not in acute exacerbation a. Increased cough but no increased sputum production, no  wheezing b. Typically on room air, currently on 4 L/min nasal cannula c. Currently around day 5 of disease, did not get COVID-19 vaccines, she was advised to get the vaccine once out of the acute window d. Continue remdesivir e. Continue steroids f. Incentive spirometry g. Added inhalers h. Continue to trend daily labs  2. COPD, not in acute exacerbation a. Plan as above  3. Type 2 diabetes a. Hold home meds b. Start Levemir 14 units twice daily with moderate sliding scale for now but can escalate since she is on steroids c. We will check an A1c  4. Hypokalemia a. replete  DVT prophylaxis: Lovenox   Code Status: Prior  Diet: Carb control Family Communication: Admission, patients condition and plan of care including tests being ordered have been discussed with the patient who indicates understanding and agrees with the plan and Code Status.  Disposition Plan: The appropriate patient status for this patient is INPATIENT. Inpatient status is judged to be reasonable and necessary in order to provide the required intensity of service to ensure the patient's safety. The patient's presenting symptoms, physical exam findings, and initial radiographic and laboratory data in the context of their chronic comorbidities is felt to place them at high risk for further clinical deterioration. Furthermore, it is not anticipated that the patient will be medically stable for discharge from the hospital within 2 midnights of admission. The following factors support the patient status of inpatient.   " The patient's presenting symptoms include shortness of breath, hypoxia. " The worrisome physical exam findings include hypoxia on room air. " The initial radiographic and laboratory data are worrisome because of COVID-19 positive. " The chronic co-morbidities include COPD, did not receive Covid vaccines.   * I certify that at the point of admission it is my clinical judgment that the patient will require  inpatient hospital care spanning beyond 2 midnights from the point of admission due to high intensity of  service, high risk for further deterioration and high frequency of surveillance required.*   Status is: Inpatient  Remains inpatient appropriate because:IV treatments appropriate due to intensity of illness or inability to take PO and Inpatient level of care appropriate due to severity of illness   Dispo: The patient is from: Home              Anticipated d/c is to: Home              Anticipated d/c date is: 3 days              Patient currently is not medically stable to d/c.      Consultants  . None  Procedures  . None  Time Spent on Admission: 65 minutes    Jae DireJared E Santita Hunsberger, DO [facility] Pager [phone] 06/20/2020, 5:17 PM

## 2020-06-20 NOTE — ED Notes (Signed)
multiple attempts made to call charge nurse at [facility] ED [phone] , no answer

## 2020-06-21 ENCOUNTER — Other Ambulatory Visit: Payer: Self-pay

## 2020-06-21 DIAGNOSIS — Z794 Long term (current) use of insulin: Secondary | ICD-10-CM

## 2020-06-21 DIAGNOSIS — J1282 Pneumonia due to coronavirus disease 2019: Secondary | ICD-10-CM

## 2020-06-21 DIAGNOSIS — E119 Type 2 diabetes mellitus without complications: Secondary | ICD-10-CM

## 2020-06-21 DIAGNOSIS — U071 COVID-19: Principal | ICD-10-CM

## 2020-06-21 LAB — COMPREHENSIVE METABOLIC PANEL
ALT: 31 U/L (ref 0–44)
AST: 19 U/L (ref 15–41)
Albumin: 2.8 g/dL — ABNORMAL LOW (ref 3.5–5.0)
Alkaline Phosphatase: 86 U/L (ref 38–126)
Anion gap: 9 (ref 5–15)
BUN: 20 mg/dL (ref 6–20)
CO2: 23 mmol/L (ref 22–32)
Calcium: 8.3 mg/dL — ABNORMAL LOW (ref 8.9–10.3)
Chloride: 105 mmol/L (ref 98–111)
Creatinine, Ser: 0.78 mg/dL (ref 0.44–1.00)
GFR calc Af Amer: >60 mL/min (ref >60)
GFR calc non Af Amer: >60 mL/min (ref >60)
Glucose, Bld: 307 mg/dL — ABNORMAL HIGH (ref 70–99)
Potassium: 4 mmol/L (ref 3.5–5.1)
Sodium: 137 mmol/L (ref 135–145)
Total Bilirubin: 0.5 mg/dL (ref 0.3–1.2)
Total Protein: 6.4 g/dL — ABNORMAL LOW (ref 6.5–8.1)

## 2020-06-21 LAB — CBC WITH DIFFERENTIAL/PLATELET
Abs Immature Granulocytes: 0 10*3/uL (ref 0.00–0.07)
Basophils Absolute: 0 10*3/uL (ref 0.0–0.1)
Basophils Relative: 0 %
Eosinophils Absolute: 0 10*3/uL (ref 0.0–0.5)
Eosinophils Relative: 0 %
HCT: 44.8 % (ref 36.0–46.0)
Hemoglobin: 14.4 g/dL (ref 12.0–15.0)
Lymphocytes Relative: 7 %
Lymphs Abs: 0.5 10*3/uL — ABNORMAL LOW (ref 0.7–4.0)
MCH: 29.6 pg (ref 26.0–34.0)
MCHC: 32.1 g/dL (ref 30.0–36.0)
MCV: 92 fL (ref 80.0–100.0)
Monocytes Absolute: 0.2 10*3/uL (ref 0.1–1.0)
Monocytes Relative: 2 %
Neutro Abs: 6.8 10*3/uL (ref 1.7–7.7)
Neutrophils Relative %: 91 %
Platelets: 233 10*3/uL (ref 150–400)
RBC: 4.87 MIL/uL (ref 3.87–5.11)
RDW: 13.8 % (ref 11.5–15.5)
WBC: 7.5 10*3/uL (ref 4.0–10.5)
nRBC: 0 % (ref 0.0–0.2)

## 2020-06-21 LAB — RESPIRATORY PANEL BY PCR

## 2020-06-21 LAB — MAGNESIUM: Magnesium: 2 mg/dL (ref 1.7–2.4)

## 2020-06-21 LAB — HEMOGLOBIN A1C
Hgb A1c MFr Bld: 7.3 % — ABNORMAL HIGH (ref 4.8–5.6)
Mean Plasma Glucose: 162.81 mg/dL

## 2020-06-21 LAB — GLUCOSE, CAPILLARY
Glucose-Capillary: 246 mg/dL — ABNORMAL HIGH (ref 70–99)
Glucose-Capillary: 331 mg/dL — ABNORMAL HIGH (ref 70–99)
Glucose-Capillary: 253 mg/dL — ABNORMAL HIGH (ref 70–99)
Glucose-Capillary: 349 mg/dL — ABNORMAL HIGH (ref 70–99)
Glucose-Capillary: 305 mg/dL — ABNORMAL HIGH (ref 70–99)

## 2020-06-21 LAB — HIV ANTIBODY (ROUTINE TESTING W REFLEX): HIV Screen 4th Generation wRfx: NONREACTIVE

## 2020-06-21 LAB — ABO/RH: ABO/RH(D): A POS

## 2020-06-21 LAB — BRAIN NATRIURETIC PEPTIDE: B Natriuretic Peptide: 52.4 pg/mL (ref 0.0–100.0)

## 2020-06-21 LAB — C-REACTIVE PROTEIN: CRP: 0.6 mg/dL (ref <1.0)

## 2020-06-21 LAB — FERRITIN: Ferritin: 506 ng/mL — ABNORMAL HIGH (ref 11–307)

## 2020-06-21 LAB — D-DIMER, QUANTITATIVE: D-Dimer, Quant: 0.46 ug/mL-FEU (ref 0.00–0.50)

## 2020-06-21 MED ORDER — INSULIN ASPART 100 UNIT/ML ~~LOC~~ SOLN
4.0000 [IU] | Freq: Three times a day (TID) | SUBCUTANEOUS | Status: DC
  Administered 2020-06-21 – 2020-06-23 (×5): 4 [IU] via SUBCUTANEOUS

## 2020-06-21 MED ORDER — INSULIN ASPART 100 UNIT/ML ~~LOC~~ SOLN
0.0000 [IU] | Freq: Three times a day (TID) | SUBCUTANEOUS | Status: DC
  Administered 2020-06-21 – 2020-06-22 (×2): 15 [IU] via SUBCUTANEOUS
  Administered 2020-06-22 – 2020-06-23 (×3): 11 [IU] via SUBCUTANEOUS

## 2020-06-21 MED ORDER — ALBUTEROL SULFATE HFA 108 (90 BASE) MCG/ACT IN AERS
2.0000 | INHALATION_SPRAY | RESPIRATORY_TRACT | Status: DC | PRN
  Filled 2020-06-21: qty 6.7

## 2020-06-21 MED ORDER — INSULIN DETEMIR 100 UNIT/ML ~~LOC~~ SOLN
18.0000 [IU] | Freq: Two times a day (BID) | SUBCUTANEOUS | Status: DC
  Administered 2020-06-21 – 2020-06-23 (×4): 18 [IU] via SUBCUTANEOUS
  Filled 2020-06-21 (×6): qty 0.18

## 2020-06-21 NOTE — Progress Notes (Signed)
PROGRESS NOTE                                                                                                                                                                                                             Patient Demographics:    Jill Barnes, is a 59 y.o. adult, DOB - [birth year], ZOX:[id number]  Outpatient Primary MD for the patient is Patient, No Pcp Per   Admit date - 06/19/2020   LOS - 1  Chief Complaint  Patient presents with  . Shortness of Breath    +COVID       Brief Narrative: Patient is a 59 y.o. adult with PMHx of COPD, DM-2-diagnosed with COVID-19 on 7/26 (unvaccinated) presented with acute hypoxic respiratory failure secondary to COVID-19 pneumonia.  See below for further details.  Significant Events: 7/26>> COVID-19 +ve 7/29>> presented to [facility] with shortness of breath-hypoxic requiring 4 L 7/30>> transferred to [facility]  Significant studies: 7/26>> chest x-ray: No focal airspace opacity 7/29>> chest x-ray: Bilateral/multifocal pneumonia left> right  COVID-19 medications: Steroids: 7/29>> Remdesivir: 7/29  Antibiotics: None  Microbiology data: 7/29>> blood culture: No growth  Procedures: None  Consults: None  DVT prophylaxis: enoxaparin (LOVENOX) injection 40 mg Start: 06/20/20 2200 SCDs Start: 06/20/20 2139    Subjective:    Jill Barnes today feels slightly better-comfortable-not in any distress-denies any shortness of breath at rest-no chest pain.   Assessment  & Plan :   Acute Hypoxic Resp Failure due to Covid 19 Viral pneumonia: Hypoxia improving-continue steroids/remdesivir.  Surprising that CRP never significantly elevated with lung infiltrates and hypoxemia requiring 4 L of oxygen.  Check respiratory virus panel-to make sure she does not have other viral infections-no signs of volume overload on exam.  Continue supportive care-watch  closely.  Fever: afebrile O2 requirements:  SpO2: (!) 89 % O2 Flow Rate (L/min): 2 L/min   COVID-19 Labs: Recent Labs    06/19/20 1538 06/19/20 1606 06/21/20 0501  DDIMER 0.77*  --  0.46  FERRITIN  --  589* 506*  LDH 331*  --   --   CRP  --  0.7 0.6       Component Value Date/Time   BNP 52.4 06/21/2020 0501    Recent Labs  Lab 06/19/20 1538  PROCALCITON <0.10    Lab Results  Component Value Date  SARSCOV2NAA POSITIVE (A) 06/16/2020    Prone/Incentive Spirometry: encouraged  incentive spirometry use 3-4/hour.  COPD: No exacerbation-but does have some scattered rhonchi-continue bronchodilators.  DM-2 (A1c 7.3 on 7/30) with uncontrolled hyperglycemia secondary to steroids: CBGs remain on the higher side-increase Levemir to 18 units twice daily, add 4 units of NovoLog with meals-change SSI to resistant scale.  Follow and adjust.   Recent Labs    06/21/20 0348 06/21/20 0735 06/21/20 1210  GLUCAP 246* 331* 253*   Morbid Obesity: Estimated body mass index is 35.48 kg/m as calculated from the following:   Height as of this encounter: 5\' 6"  (1.676 m).   Weight as of this encounter: 99.7 kg.    ABG:    Component Value Date/Time   TCO2 23 05/19/2012 1700    Vent Settings: N/A  Condition - Stable  Family Communication  : Called daughter-unable to leave voicemail-mailbox full-on 7/31  Code Status :  Full Code  Diet :  Diet Order            Diet heart healthy/carb modified Room service appropriate? Yes with Assist; Fluid consistency: Thin  Diet effective now                  Disposition Plan  :   Status is: Inpatient  Remains inpatient appropriate because:Inpatient level of care appropriate due to severity of illness   Dispo: The patient is from: Home              Anticipated d/c is to: Home              Anticipated d/c date is: > 3 days              Patient currently is not medically stable to d/c.    Barriers to discharge: Hypoxia  requiring O2 supplementation/complete 5 days of IV Remdesivir  Antimicorbials  :    Anti-infectives (From admission, onward)   Start     Dose/Rate Route Frequency Ordered Stop   06/21/20 1000  remdesivir 100 mg in sodium chloride 0.9 % 100 mL IVPB     Discontinue    "Followed by" Linked Group Details   100 mg 200 mL/hr over 30 Minutes Intravenous Daily 06/20/20 2138 06/23/20 2359   06/20/20 2138  remdesivir 200 mg in sodium chloride 0.9% 250 mL IVPB       "Followed by" Linked Group Details   200 mg 580 mL/hr over 30 Minutes Intravenous Once 06/20/20 2138 06/21/20 0028   06/20/20 1000  remdesivir 100 mg in sodium chloride 0.9 % 100 mL IVPB  Status:  Discontinued        100 mg 200 mL/hr over 30 Minutes Intravenous Daily 06/19/20 1621 06/20/20 2138   06/19/20 1630  remdesivir 100 mg in sodium chloride 0.9 % 100 mL IVPB        100 mg 200 mL/hr over 30 Minutes Intravenous Every 30 min 06/19/20 1621 06/19/20 1817      Inpatient Medications  Scheduled Meds: . atorvastatin  20 mg Oral Daily  . enoxaparin (LOVENOX) injection  40 mg Subcutaneous Q24H  . insulin aspart  4 Units Subcutaneous TID WC  . insulin detemir  18 Units Subcutaneous BID  . Ipratropium-Albuterol  1 puff Inhalation Q6H  . linagliptin  5 mg Oral Daily  . methylPREDNISolone (SOLU-MEDROL) injection  50 mg Intravenous Q12H   Continuous Infusions: . remdesivir 100 mg in NS 100 mL 100 mg (06/21/20 0832)   PRN Meds:.acetaminophen, albuterol, chlorpheniramine-HYDROcodone,  guaiFENesin-dextromethorphan, polyethylene glycol   Time Spent in minutes  25  See all Orders from today for further details   Jeoffrey Massed M.D on 06/21/2020 at 2:39 PM  To page go to [email or web address] - use universal password  [facility] -  Office  [phone]    Objective:   Vitals:   06/21/20 0030 06/21/20 0400 06/21/20 0810 06/21/20 1242  BP:  (!) 128/60  (!) 134/74  Pulse:  67 65 70  Resp:  23 17 20   Temp:  97.6 F (36.4 C)   97.9 F (36.6 C)  TempSrc:  Oral  Oral  SpO2:  92% 93% (!) 89%  Weight: (!) 99.7 kg     Height: 5\' 6"  (1.676 m)       Wt Readings from Last 3 Encounters:  06/21/20 (!) 99.7 kg  06/16/20 (!) 96.2 kg  10/31/19 96.2 kg     Intake/Output Summary (Last 24 hours) at 06/21/2020 1439 Last data filed at 06/21/2020 1035 Gross per 24 hour  Intake 240 ml  Output --  Net 240 ml     Physical Exam Gen Exam:Alert awake-not in any distress HEENT:atraumatic, normocephalic Chest: B/L clear to auscultation anteriorly CVS:S1S2 regular Abdomen:soft non tender, non distended Extremities:no edema Neurology: Non focal Skin: no rash   Data Review:    CBC Recent Labs  Lab 06/16/20 1336 06/19/20 1538 06/20/20 2139 06/21/20 0501  WBC 3.8* 5.6 9.1 7.5  HGB 15.2* 15.6* 14.9 14.4  HCT 46.9* 47.3* 46.1* 44.8  PLT 151 174 215 233  MCV 94.7 92.4 93.3 92.0  MCH 30.7 30.5 30.2 29.6  MCHC 32.4 33.0 32.3 32.1  RDW 14.5 14.0 13.9 13.8  LYMPHSABS 1.1 1.7  --  0.5*  MONOABS 0.3 0.0*  --  0.2  EOSABS 0.0 0.1  --  0.0  BASOSABS 0.0 0.0  --  0.0    Chemistries  Recent Labs  Lab 06/16/20 1336 06/19/20 1538 06/20/20 2139 06/21/20 0501  NA 133* 137  --  137  K 3.2* 3.4*  --  4.0  CL 98 100  --  105  CO2 25 25  --  23  GLUCOSE 233* 185*  --  307*  BUN 13 12  --  20  CREATININE 0.88 0.76 0.90 0.78  CALCIUM 7.6* 8.1*  --  8.3*  MG  --   --   --  2.0  AST 33 42*  --  19  ALT 33 40  --  31  ALKPHOS 101 91  --  86  BILITOT 0.4 0.5  --  0.5   ------------------------------------------------------------------------------------------------------------------ Recent Labs    06/19/20 1538  TRIG 126    Lab Results  Component Value Date   HGBA1C 7.3 (H) 06/20/2020   ------------------------------------------------------------------------------------------------------------------ No results for input(s): TSH, T4TOTAL, T3FREE, THYROIDAB in the last 72 hours.  Invalid input(s):  FREET3 ------------------------------------------------------------------------------------------------------------------ Recent Labs    06/19/20 1606 06/21/20 0501  FERRITIN 589* 506*    Coagulation profile No results for input(s): INR, PROTIME in the last 168 hours.  Recent Labs    06/19/20 1538 06/21/20 0501  DDIMER 0.77* 0.46    Cardiac Enzymes No results for input(s): CKMB, TROPONINI, MYOGLOBIN in the last 168 hours.  Invalid input(s): CK ------------------------------------------------------------------------------------------------------------------    Component Value Date/Time   BNP 52.4 06/21/2020 0501    Micro Results Recent Results (from the past 240 hour(s))  SARS Coronavirus 2 by RT PCR (hospital order, performed in [facility] hospital lab) Nasopharyngeal Nasopharyngeal Swab  Status: Abnormal   Collection Time: 06/16/20 12:40 PM   Specimen: Nasopharyngeal Swab  Result Value Ref Range Status   SARS Coronavirus 2 POSITIVE (A) NEGATIVE Final    Comment: RESULT CALLED TO, READ BACK BY AND VERIFIED WITH: CALLED TO M.SIMS RN AT 1334 ON [id number] BY SROY (NOTE) SARS-CoV-2 target nucleic acids are DETECTED  SARS-CoV-2 RNA is generally detectable in upper respiratory specimens  during the acute phase of infection.  Positive results are indicative  of the presence of the identified virus, but do not rule out bacterial infection or co-infection with other pathogens not detected by the test.  Clinical correlation with patient history and  other diagnostic information is necessary to determine patient infection status.  The expected result is negative.  Fact Sheet for Patients:   BoilerBrush.com.cy   Fact Sheet for Healthcare Providers:   https://pope.com/    This test is not yet approved or cleared by the [place] FDA and  has been authorized for detection and/or diagnosis of SARS-CoV-2 by FDA under an  Emergency Use Authorization (EUA).  This EUA will remain in effect (mean ing this test can be used) for the duration of  the COVID-19 declaration under Section 564(b)(1) of the Act, 21 U.S.C. section 360-bbb-3(b)(1), unless the authorization is terminated or revoked sooner.  Performed at [facility], [address] [place], [place] [address]   Blood Culture (routine x 2)     Status: None (Preliminary result)   Collection Time: 06/19/20  3:38 PM   Specimen: Right Antecubital; Blood  Result Value Ref Range Status   Specimen Description   Final    RIGHT ANTECUBITAL Performed at [facility], [address] [place], [place] [address]    Special Requests   Final    BOTTLES DRAWN AEROBIC AND ANAEROBIC Blood Culture adequate volume Performed at [facility], [address] [place], [place] [address]    Culture   Final    NO GROWTH 2 DAYS Performed at [facility], [address], [place], [place] [address]    Report Status PENDING  Incomplete  Blood Culture (routine x 2)     Status: None (Preliminary result)   Collection Time: 06/19/20  3:42 PM   Specimen: BLOOD RIGHT FOREARM  Result Value Ref Range Status   Specimen Description   Final    BLOOD RIGHT FOREARM Performed at [facility], [address] [place], [place] [address]    Special Requests   Final    BOTTLES DRAWN AEROBIC AND ANAEROBIC Blood Culture adequate volume Performed at [facility], [address] [place], [place] [address]    Culture   Final    NO GROWTH 2 DAYS Performed at [facility], [address], [place], [place] [address]    Report Status PENDING  Incomplete    Radiology Reports DG Chest Port 1 View  Result Date: 06/19/2020 CLINICAL DATA:  Shortness of breath.  COVID-19 positive. EXAM: PORTABLE CHEST 1 VIEW COMPARISON:  June 16, 2020. FINDINGS: The heart size and mediastinal contours are within normal limits. No pneumothorax or  pleural effusion is noted. Possible right basilar opacity is noted concerning for pneumonia. Multiple airspace opacities are noted in the left lung concerning for multifocal pneumonia. The visualized skeletal structures are unremarkable. IMPRESSION: Findings consistent with bilateral and multifocal pneumonia, left greater than right. Electronically Signed   By: Lupita Raider  M.D.   On: 06/19/2020 15:38   DG Chest Port 1 View  Result Date: 06/16/2020 CLINICAL DATA:  Cough EXAM: PORTABLE CHEST 1 VIEW COMPARISON:  11/01/2019 chest radiograph. FINDINGS: No focal airspace opacity. Mild peribronchial thickening, unchanged. No pneumothorax or pleural effusion. Cardiomediastinal silhouette is within normal limits. No acute osseous abnormality. IMPRESSION: No focal airspace opacity. Mild peribronchial thickening is unchanged and may reflect sequela of bronchitis. Electronically Signed   By: Stana Bunting M.D.   On: 06/16/2020 13:31

## 2020-06-21 NOTE — Significant Event (Signed)
HOSPITAL MEDICINE OVERNIGHT EVENT NOTE   Notified by nursing that patient states that she wishes to be full code.  Patient is awake, alert and appropriate and seems to be able to make medical decisions.  Order placed for full code.  Deno Lunger Jaan Fischel

## 2020-06-22 DIAGNOSIS — E876 Hypokalemia: Secondary | ICD-10-CM

## 2020-06-22 DIAGNOSIS — Z794 Long term (current) use of insulin: Secondary | ICD-10-CM

## 2020-06-22 DIAGNOSIS — U071 COVID-19: Secondary | ICD-10-CM

## 2020-06-22 DIAGNOSIS — E119 Type 2 diabetes mellitus without complications: Secondary | ICD-10-CM

## 2020-06-22 LAB — CBC WITH DIFFERENTIAL/PLATELET
Abs Immature Granulocytes: 0.07 10*3/uL (ref 0.00–0.07)
Basophils Absolute: 0 10*3/uL (ref 0.0–0.1)
Basophils Relative: 0 %
Eosinophils Absolute: 0 10*3/uL (ref 0.0–0.5)
Eosinophils Relative: 0 %
HCT: 44 % (ref 36.0–46.0)
Hemoglobin: 14.3 g/dL (ref 12.0–15.0)
Immature Granulocytes: 1 %
Lymphocytes Relative: 12 %
Lymphs Abs: 1.2 10*3/uL (ref 0.7–4.0)
MCH: 30 pg (ref 26.0–34.0)
MCHC: 32.5 g/dL (ref 30.0–36.0)
MCV: 92.4 fL (ref 80.0–100.0)
Monocytes Absolute: 0.4 10*3/uL (ref 0.1–1.0)
Monocytes Relative: 4 %
Neutro Abs: 8.4 10*3/uL — ABNORMAL HIGH (ref 1.7–7.7)
Neutrophils Relative %: 83 %
Platelets: 283 10*3/uL (ref 150–400)
RBC: 4.76 MIL/uL (ref 3.87–5.11)
RDW: 13.6 % (ref 11.5–15.5)
WBC: 10.1 10*3/uL (ref 4.0–10.5)
nRBC: 0 % (ref 0.0–0.2)

## 2020-06-22 LAB — COMPREHENSIVE METABOLIC PANEL
ALT: 26 U/L (ref 0–44)
AST: 14 U/L — ABNORMAL LOW (ref 15–41)
Albumin: 2.7 g/dL — ABNORMAL LOW (ref 3.5–5.0)
Alkaline Phosphatase: 87 U/L (ref 38–126)
Anion gap: 10 (ref 5–15)
BUN: 22 mg/dL — ABNORMAL HIGH (ref 6–20)
CO2: 24 mmol/L (ref 22–32)
Calcium: 8.3 mg/dL — ABNORMAL LOW (ref 8.9–10.3)
Chloride: 105 mmol/L (ref 98–111)
Creatinine, Ser: 0.9 mg/dL (ref 0.44–1.00)
GFR calc Af Amer: >60 mL/min (ref >60)
GFR calc non Af Amer: >60 mL/min (ref >60)
Glucose, Bld: 321 mg/dL — ABNORMAL HIGH (ref 70–99)
Potassium: 3.9 mmol/L (ref 3.5–5.1)
Sodium: 139 mmol/L (ref 135–145)
Total Bilirubin: 0.8 mg/dL (ref 0.3–1.2)
Total Protein: 6.4 g/dL — ABNORMAL LOW (ref 6.5–8.1)

## 2020-06-22 LAB — GLUCOSE, CAPILLARY
Glucose-Capillary: 272 mg/dL — ABNORMAL HIGH (ref 70–99)
Glucose-Capillary: 276 mg/dL — ABNORMAL HIGH (ref 70–99)
Glucose-Capillary: 296 mg/dL — ABNORMAL HIGH (ref 70–99)
Glucose-Capillary: 312 mg/dL — ABNORMAL HIGH (ref 70–99)
Glucose-Capillary: 199 mg/dL — ABNORMAL HIGH (ref 70–99)

## 2020-06-22 LAB — D-DIMER, QUANTITATIVE: D-Dimer, Quant: 0.51 ug/mL-FEU — ABNORMAL HIGH (ref 0.00–0.50)

## 2020-06-22 LAB — FERRITIN: Ferritin: 455 ng/mL — ABNORMAL HIGH (ref 11–307)

## 2020-06-22 LAB — C-REACTIVE PROTEIN: CRP: 0.5 mg/dL (ref <1.0)

## 2020-06-22 NOTE — Progress Notes (Signed)
PROGRESS NOTE    Jill Barnes  RJJ:[id number] DOB: 1961/10/13 DOA: 06/19/2020 PCP: Patient, No Pcp Per    Brief Narrative:  Patient admitted to the hospital with working diagnosis of acute hypoxic respiratory failure due to SARS COVID-19 viral pneumonia.   She was transferred from [facility] on 07/30.   59 year old female with past medical history for COPD, and type II times mellitus.  Patient diagnosed with SARS COVID-19 on July 26, his symptoms were consistent with fevers, chills, cough, diarrhea, malaise, myalgias and dyspnea.  On her initial physical examination he was hypoxic, requiring 3 to 4 L per nasal cannula.  Her blood pressure was 102/77, pulse rate 71, respiratory rate 18, oxygen saturation 91% to 96%.  His lungs had no wheezing or rhonchi, heart S1-S2, present rhythmic, soft abdomen, no lower extremity edema. Sodium 137, potassium 3.4, chloride 100, bicarb 25, glucose 185, BUN 12, creatinine 0.76, AST 42, ALT 40, white count 5.6, hemoglobin 15.6, hematocrit 47.3, platelets 174. Chest radiograph with interstitial infiltrates left upper lobe and right lower lobe. EKG 80 bpm, normal axis, normal intervals, sinus rhythm, no ST segment or T wave changes.  Patient has been placed on supplemental oxygen, systemic steroids and remdesivir.   Assessment & Plan:   Principal Problem:   Acute hypoxemic respiratory failure due to COVID-19 [facility]) Active Problems:   Acute respiratory failure with hypoxia (HCC)   Type 2 diabetes mellitus without complication (HCC)   Hypokalemia   1.  Acute hypoxemic respiratory failure due to SARS COVID-19 viral pneumonia.  RR: 23  Pulse oxymetry: 93% Fi02: 2 L/ min per [place].   COVID-19 Labs  Recent Labs    06/19/20 1538 06/19/20 1606 06/21/20 0501  DDIMER 0.77*  --  0.46  FERRITIN  --  589* 506*  LDH 331*  --   --   CRP  --  0.7 0.6    Lab Results  Component Value Date   SARSCOV2NAA POSITIVE (A) 06/16/2020    Patient with improvement in  her dyspnea, continue using supplemental 02 this am.  Inflammatory markers are trending down.  Continue medical therapy with remdesivir and dexamethasone. Continue antitussive agents, airway clearing techniques and bronchodilator therapy.   Will assess in am, if patient can continue remdesivir as outpatient to complete 5 doses.  2. COPD. No signs of acute exacerbation, will continue oxymetry monitoring.   3. Uncontrolled T2DM HgbA1c 7.3/ steroids induced hyperglycemia.  Fasting glucose this am 321. Continue basal insulin 18 units bid and 4  Units pre-meal. Continue insulin sliding scale.    Status is: Inpatient  Remains inpatient appropriate because:IV treatments appropriate due to intensity of illness or inability to take PO   Dispo: The patient is from: Home              Anticipated d/c is to: Home              Anticipated d/c date is: 2 days              Patient currently is not medically stable to d/c.   DVT prophylaxis:  enoxaparin   Code Status:   full  Family Communication:  No family at the bedside      Subjective: Patient feeling better, no nausea or vomiting, this am continue to need supplemental 02 per [place].   Objective: Vitals:   06/21/20 1242 06/21/20 1600 06/21/20 2006 06/22/20 0429  BP: (!) 134/74 (!) 111/59 (!) 138/64 (!) 139/80  Pulse: 70 82 75 69  Resp: 20 20 22 23   Temp: 97.9 F (36.6 C)  98 F (36.7 C) 97.7 F (36.5 C)  TempSrc: Oral  Oral Oral  SpO2: (!) 89% 90% 92% 93%  Weight:      Height:        Intake/Output Summary (Last 24 hours) at 06/22/2020 08/22/2020 Last data filed at 06/22/2020 0430 Gross per 24 hour  Intake 580 ml  Output --  Net 580 ml   Filed Weights   06/21/20 0030  Weight: (!) 99.7 kg    Examination:   General: deconditioned  Neurology: Awake and alert, non focal  E ENT: no pallor, no icterus, oral mucosa moist Cardiovascular: No JVD. S1-S2 present, rhythmic, no gallops, rubs, or murmurs. No lower extremity edema. Pulmonary:  positive breath sounds bilaterally, Gastrointestinal. Abdomen soft and non tender Skin. No rashes Musculoskeletal: no joint deformities     Data Reviewed: I have personally reviewed following labs and imaging studies  CBC: Recent Labs  Lab 06/16/20 1336 06/19/20 1538 06/20/20 2139 06/21/20 0501  WBC 3.8* 5.6 9.1 7.5  NEUTROABS 2.3 3.8  --  6.8  HGB 15.2* 15.6* 14.9 14.4  HCT 46.9* 47.3* 46.1* 44.8  MCV 94.7 92.4 93.3 92.0  PLT 151 174 215 233   Basic Metabolic Panel: Recent Labs  Lab 06/16/20 1336 06/19/20 1538 06/20/20 2139 06/21/20 0501  NA 133* 137  --  137  K 3.2* 3.4*  --  4.0  CL 98 100  --  105  CO2 25 25  --  23  GLUCOSE 233* 185*  --  307*  BUN 13 12  --  20  CREATININE 0.88 0.76 0.90 0.78  CALCIUM 7.6* 8.1*  --  8.3*  MG  --   --   --  2.0   GFR: Estimated Creatinine Clearance (by C-G formula based on SCr of 0.78 mg/dL) Female: 06/23/20 mL/min Female: 111.3 mL/min Liver Function Tests: Recent Labs  Lab 06/16/20 1336 06/19/20 1538 06/21/20 0501  AST 33 42* 19  ALT 33 40 31  ALKPHOS 101 91 86  BILITOT 0.4 0.5 0.5  PROT 7.1 7.1 6.4*  ALBUMIN 3.3* 3.3* 2.8*   Recent Labs  Lab 06/16/20 1336  LIPASE 28   No results for input(s): AMMONIA in the last 168 hours. Coagulation Profile: No results for input(s): INR, PROTIME in the last 168 hours. Cardiac Enzymes: No results for input(s): CKTOTAL, CKMB, CKMBINDEX, TROPONINI in the last 168 hours. BNP (last 3 results) No results for input(s): PROBNP in the last 8760 hours. HbA1C: Recent Labs    06/20/20 2143  HGBA1C 7.3*   CBG: Recent Labs  Lab 06/21/20 0735 06/21/20 1210 06/21/20 1625 06/21/20 2125 06/22/20 0744  GLUCAP 331* 253* 349* 305* 272*   Lipid Profile: Recent Labs    06/19/20 1538  TRIG 126   Thyroid Function Tests: No results for input(s): TSH, T4TOTAL, FREET4, T3FREE, THYROIDAB in the last 72 hours. Anemia Panel: Recent Labs    06/19/20 1606 06/21/20 0501  FERRITIN  589* 506*      Radiology Studies: I have reviewed all of the imaging during this hospital visit personally     Scheduled Meds: . atorvastatin  20 mg Oral Daily  . enoxaparin (LOVENOX) injection  40 mg Subcutaneous Q24H  . insulin aspart  0-20 Units Subcutaneous TID WC  . insulin aspart  4 Units Subcutaneous TID WC  . insulin detemir  18 Units Subcutaneous BID  . Ipratropium-Albuterol  1 puff Inhalation Q6H  .  linagliptin  5 mg Oral Daily  . methylPREDNISolone (SOLU-MEDROL) injection  50 mg Intravenous Q12H   Continuous Infusions: . remdesivir 100 mg in NS 100 mL 100 mg (06/22/20 0829)     LOS: 2 days        Rigdon Macomber Annett Gula, MD

## 2020-06-23 LAB — CBC WITH DIFFERENTIAL/PLATELET
Abs Immature Granulocytes: 0.06 10*3/uL (ref 0.00–0.07)
Basophils Absolute: 0 10*3/uL (ref 0.0–0.1)
Basophils Relative: 0 %
Eosinophils Absolute: 0 10*3/uL (ref 0.0–0.5)
Eosinophils Relative: 0 %
HCT: 45.7 % (ref 36.0–46.0)
Hemoglobin: 15.1 g/dL — ABNORMAL HIGH (ref 12.0–15.0)
Immature Granulocytes: 1 %
Lymphocytes Relative: 8 %
Lymphs Abs: 1 10*3/uL (ref 0.7–4.0)
MCH: 30.6 pg (ref 26.0–34.0)
MCHC: 33 g/dL (ref 30.0–36.0)
MCV: 92.7 fL (ref 80.0–100.0)
Monocytes Absolute: 0.6 10*3/uL (ref 0.1–1.0)
Monocytes Relative: 5 %
Neutro Abs: 10.7 10*3/uL — ABNORMAL HIGH (ref 1.7–7.7)
Neutrophils Relative %: 86 %
Platelets: 315 10*3/uL (ref 150–400)
RBC: 4.93 MIL/uL (ref 3.87–5.11)
RDW: 13.5 % (ref 11.5–15.5)
WBC: 12.3 10*3/uL — ABNORMAL HIGH (ref 4.0–10.5)
nRBC: 0 % (ref 0.0–0.2)

## 2020-06-23 LAB — COMPREHENSIVE METABOLIC PANEL
ALT: 25 U/L (ref 0–44)
AST: 12 U/L — ABNORMAL LOW (ref 15–41)
Albumin: 2.7 g/dL — ABNORMAL LOW (ref 3.5–5.0)
Alkaline Phosphatase: 91 U/L (ref 38–126)
Anion gap: 10 (ref 5–15)
BUN: 24 mg/dL — ABNORMAL HIGH (ref 6–20)
CO2: 24 mmol/L (ref 22–32)
Calcium: 8.3 mg/dL — ABNORMAL LOW (ref 8.9–10.3)
Chloride: 105 mmol/L (ref 98–111)
Creatinine, Ser: 0.81 mg/dL (ref 0.44–1.00)
GFR calc Af Amer: >60 mL/min (ref >60)
GFR calc non Af Amer: >60 mL/min (ref >60)
Glucose, Bld: 221 mg/dL — ABNORMAL HIGH (ref 70–99)
Potassium: 3.8 mmol/L (ref 3.5–5.1)
Sodium: 139 mmol/L (ref 135–145)
Total Bilirubin: 0.6 mg/dL (ref 0.3–1.2)
Total Protein: 6.3 g/dL — ABNORMAL LOW (ref 6.5–8.1)

## 2020-06-23 LAB — GLUCOSE, CAPILLARY
Glucose-Capillary: 255 mg/dL — ABNORMAL HIGH (ref 70–99)
Glucose-Capillary: 210 mg/dL — ABNORMAL HIGH (ref 70–99)

## 2020-06-23 LAB — C-REACTIVE PROTEIN: CRP: <0.5 mg/dL (ref <1.0)

## 2020-06-23 LAB — FERRITIN: Ferritin: 430 ng/mL — ABNORMAL HIGH (ref 11–307)

## 2020-06-23 LAB — D-DIMER, QUANTITATIVE: D-Dimer, Quant: 0.84 ug/mL-FEU — ABNORMAL HIGH (ref 0.00–0.50)

## 2020-06-23 MED ORDER — GUAIFENESIN-DM 100-10 MG/5ML PO SYRP: 10.0000 mL | ORAL_SOLUTION | Freq: Four times a day (QID) | ORAL | 0 refills | Status: DC | PRN

## 2020-06-23 NOTE — Progress Notes (Signed)
Home O2 eval: O2 sat 90-91% RA on ambulation  O2 sat 92-93% RA at rest

## 2020-06-23 NOTE — Discharge Summary (Signed)
Physician Discharge Summary  Jill Barnes ZOX:[id number] DOB: [birth year] DOA: 06/19/2020  PCP: Patient, No Pcp Per  Admit date: 06/19/2020 Discharge date: 06/23/2020  Admitted From: home  Disposition:  Home   Recommendations for Outpatient Follow-up and new medication changes:  1. Follow up with Primary care in 7 days.  2. Continue self quarantine for the next 2 weeks, use a mask in public and maintain physical distancing.   Home Health: no   Equipment/Devices: no   Discharge Condition: stable CODE STATUS: full  Diet recommendation: heart healthy and diabetic prudent,   Brief/Interim Summary: Patient admitted to the hospital with working diagnosis of acute hypoxic respiratory failure due to SARS COVID-19 viral pneumonia.   She was [facility] from [facility] on 07[facility].   59 year old female with past medical history for COPD, and type II diabetes mellitus.  Patient diagnosed with SARS COVID-19 on July 26, her symptoms were consistent with fevers, chills, cough, diarrhea, malaise, myalgias and dyspnea.  On her initial physical examination she was hypoxic, requiring 3 to 4 L per nasal cannula.  Her blood pressure was 102/77, pulse rate 71, respiratory rate 18, oxygen saturation 91% to 96%.  Her lungs had no wheezing or rhonchi, heart S1-S2, present rhythmic, soft abdomen, no lower extremity edema. Sodium 137, potassium 3.4, chloride 100, bicarb 25, glucose 185, BUN 12, creatinine 0.76, AST 42, ALT 40, white count 5.6, hemoglobin 15.6, hematocrit 47.3, platelets 174. Chest radiograph with interstitial infiltrates left upper lobe and right lower lobe. EKG 80 bpm, normal axis, normal intervals, sinus rhythm, no ST segment or T wave changes.  Patient has been placed on supplemental oxygen, systemic steroids and remdesivir.   1.  Acute hypoxic respiratory failure due to SARS COVID-19 viral pneumonia. The patient was admitted to the medical ward, she received supplemental oxygen per nasal  cannula.  Medical therapy with systemic steroids and remdesivir.  She was treated with antitussive agents, bronchodilators, and airway clearing techniques with flutter valve and incentive spirometry.  Her symptoms and inflammatory markers improved.  Patient completed 5 days of antiviral therapy.  COVID-19 Labs  Recent Labs    06/21/20 0501 06/22/20 0859 06/23/20 0354  DDIMER 0.46 0.51* 0.84*  FERRITIN 506* 455* 430*  CRP 0.6 0.5 <0.5    Lab Results  Component Value Date   SARSCOV2NAA POSITIVE (A) 06/16/2020   Her oximetry at discharge was 90 to 91% on room air on ambulation..  2.  COPD.  No signs of acute exacerbation.  3.  Uncontrolled type II that is mellitus, hemoglobin A1c 7.3, steroid-induced hyperglycemia.  Patient was placed on insulin therapy during her hospitalization, including basal insulin, 18 units twice daily, 4 units of premeal short-acting insulin along with insulin sliding scale.  At discharge patient will discontinue steroids.  She will resume glipizide, pioglitazone and insulin 70/30 regimen.  4.  Dyslipidemia.  Continue statin therapy.  Discharge Diagnoses:  Principal Problem:   Acute hypoxemic respiratory failure due to COVID-19 [facility](HCC) Active Problems:   Acute respiratory failure with hypoxia (HCC)   Type 2 diabetes mellitus without complication (HCC)   Hypokalemia    Discharge Instructions  Discharge Instructions    Diet - low sodium heart healthy   Complete by: As directed    Discharge instructions   Complete by: As directed    Please continue quarantine for 2 more weeks, use a mask in public and maintain physical distancing.   Increase activity slowly   Complete by: As directed    MyChart COVID-19  home monitoring program   Complete by: Jun 23, 2020    Is the patient willing to use the MyChart Mobile App for home monitoring?: Yes   Temperature monitoring   Complete by: Jun 23, 2020    After how many days would you like to receive a  notification of this patient's flowsheet entries?: 1     Allergies as of 06/23/2020      Reactions   Sulfa Antibiotics Shortness Of Breath, Swelling   All-over swelling   Penicillins Rash      Medication List    STOP taking these medications   benzonatate 100 MG capsule Commonly known as: TESSALON   guaiFENesin 600 MG 12 hr tablet Commonly known as: MUCINEX   methocarbamol 500 MG tablet Commonly known as: ROBAXIN   naproxen 500 MG tablet Commonly known as: NAPROSYN   tamsulosin 0.4 MG Caps capsule Commonly known as: Flomax     TAKE these medications   albuterol 108 (90 Base) MCG/ACT inhaler Commonly known as: VENTOLIN HFA Inhale 2 puffs into the lungs every 6 (six) hours as needed for wheezing or shortness of breath.   atorvastatin 20 MG tablet Commonly known as: LIPITOR Take 20 mg by mouth daily.   glipiZIDE 10 MG 24 hr tablet Commonly known as: GLUCOTROL XL Take 10 mg by mouth daily with breakfast.   guaiFENesin-dextromethorphan 100-10 MG/5ML syrup Commonly known as: ROBITUSSIN DM Take 10 mLs by mouth every 6 (six) hours as needed for cough.   ibuprofen 200 MG tablet Commonly known as: ADVIL Take 800 mg by mouth every 6 (six) hours as needed for fever or mild pain.   NovoLIN 70/30 ReliOn (70-30) 100 UNIT/ML injection Generic drug: insulin NPH-regular Human Inject 15 Units into the skin in the morning and at bedtime.   pioglitazone 45 MG tablet Commonly known as: ACTOS Take 45 mg by mouth daily.       Allergies  Allergen Reactions  . Sulfa Antibiotics Shortness Of Breath and Swelling    All-over swelling  . Penicillins Rash        Procedures/Studies: DG Chest Port 1 View  Result Date: 06/19/2020 CLINICAL DATA:  Shortness of breath.  COVID-19 positive. EXAM: PORTABLE CHEST 1 VIEW COMPARISON:  June 16, 2020. FINDINGS: The heart size and mediastinal contours are within normal limits. No pneumothorax or pleural effusion is noted. Possible right  basilar opacity is noted concerning for pneumonia. Multiple airspace opacities are noted in the left lung concerning for multifocal pneumonia. The visualized skeletal structures are unremarkable. IMPRESSION: Findings consistent with bilateral and multifocal pneumonia, left greater than right. Electronically Signed   By: Lupita Raider M.D.   On: 06/19/2020 15:38   DG Chest Port 1 View  Result Date: 06/16/2020 CLINICAL DATA:  Cough EXAM: PORTABLE CHEST 1 VIEW COMPARISON:  11/01/2019 chest radiograph. FINDINGS: No focal airspace opacity. Mild peribronchial thickening, unchanged. No pneumothorax or pleural effusion. Cardiomediastinal silhouette is within normal limits. No acute osseous abnormality. IMPRESSION: No focal airspace opacity. Mild peribronchial thickening is unchanged and may reflect sequela of bronchitis. Electronically Signed   By: Stana Bunting M.D.   On: 06/16/2020 13:31        Subjective: Patient is feeling better, no nausea or vomiting, dyspnea continue to improve, no chest pain.   Discharge Exam: Vitals:   06/23/20 0113 06/23/20 0458  BP:  (!) 149/84  Pulse:  68  Resp:  18  Temp:  (!) 97.5 F (36.4 C)  SpO2: 90% 92%  Vitals:   06/22/20 1400 06/22/20 2107 06/23/20 0113 06/23/20 0458  BP: (!) 140/89 (!) 135/73  (!) 149/84  Pulse: 74 70  68  Resp: Temp: 98.3 F (36.8 C) 97.6 F (36.4 C)  (!) 97.5 F (36.4 C)  TempSrc: Oral Oral  Oral  SpO2: 90% 90% 90% 92%  Weight:      Height:        General: not in pain or dyspnea.  Neurology: Awake and alert, non focal  E ENT: no pallor, no icterus, oral mucosa moist Cardiovascular: No JVD. S1-S2 present, rhythmic, no gallops, rubs, or murmurs. No lower extremity edema. Pulmonary: positive breath sounds bilaterally,  no wheezing, rhonchi or rales. Gastrointestinal. Abdomen flat soft and non tender Skin. No rashes Musculoskeletal: no joint deformities   The results of significant diagnostics from this  hospitalization (including imaging, microbiology, ancillary and laboratory) are listed below for reference.     Microbiology: Recent Results (from the past 240 hour(s))  SARS Coronavirus 2 by RT PCR (hospital order, performed in [facility] hospital lab) Nasopharyngeal Nasopharyngeal Swab     Status: Abnormal   Collection Time: 06/16/20 12:40 PM   Specimen: Nasopharyngeal Swab  Result Value Ref Range Status   SARS Coronavirus 2 POSITIVE (A) NEGATIVE Final    Comment: RESULT CALLED TO, READ BACK BY AND VERIFIED WITH: CALLED TO M.SIMS RN AT 1334 ON [id number] BY SROY (NOTE) SARS-CoV-2 target nucleic acids are DETECTED  SARS-CoV-2 RNA is generally detectable in upper respiratory specimens  during the acute phase of infection.  Positive results are indicative  of the presence of the identified virus, but do not rule out bacterial infection or co-infection with other pathogens not detected by the test.  Clinical correlation with patient history and  other diagnostic information is necessary to determine patient infection status.  The expected result is negative.  Fact Sheet for Patients:   BoilerBrush.com.cy   Fact Sheet for Healthcare Providers:   https://pope.com/    This test is not yet approved or cleared by the [place] FDA and  has been authorized for detection and/or diagnosis of SARS-CoV-2 by FDA under an Emergency Use Authorization (EUA).  This EUA will remain in effect (mean ing this test can be used) for the duration of  the COVID-19 declaration under Section 564(b)(1) of the Act, 21 U.S.C. section 360-bbb-3(b)(1), unless the authorization is terminated or revoked sooner.  Performed at [facility], [address] [place], [place] [address]   Blood Culture (routine x 2)     Status: None (Preliminary result)   Collection Time: 06/19/20  3:38 PM   Specimen: Right Antecubital; Blood  Result Value Ref Range  Status   Specimen Description   Final    RIGHT ANTECUBITAL Performed at [facility], [address] [place], [place] [address]    Special Requests   Final    BOTTLES DRAWN AEROBIC AND ANAEROBIC Blood Culture adequate volume Performed at [facility], [address] [place], [place] [address]    Culture   Final    NO GROWTH 3 DAYS Performed at [facility], [address] [place], [place] [address]    Report Status PENDING  Incomplete  Blood Culture (routine x 2)     Status: None (Preliminary result)   Collection Time: 06/19/20  3:42 PM   Specimen: BLOOD RIGHT FOREARM  Result Value Ref Range Status   Specimen  Description   Final    BLOOD RIGHT FOREARM Performed at [facility], [address] [place], [place] [address]    Special Requests   Final    BOTTLES DRAWN AEROBIC AND ANAEROBIC Blood Culture adequate volume Performed at [facility], [address] [place], [place] [address]    Culture   Final    NO GROWTH 3 DAYS Performed at [facility], [address], [place], [place] [address]    Report Status PENDING  Incomplete  Respiratory Panel by PCR     Status: None   Collection Time: 06/21/20  1:25 PM   Specimen: Nasopharyngeal Swab; Respiratory  Result Value Ref Range Status   Adenovirus NOT DETECTED NOT DETECTED Final   Coronavirus 229E NOT DETECTED NOT DETECTED Final    Comment: (NOTE) The Coronavirus on the Respiratory Panel, DOES NOT test for the novel  Coronavirus (2019 nCoV)    Coronavirus HKU1 NOT DETECTED NOT DETECTED Final   Coronavirus NL63 NOT DETECTED NOT DETECTED Final   Coronavirus OC43 NOT DETECTED NOT DETECTED Final   Metapneumovirus NOT DETECTED NOT DETECTED Final   Rhinovirus / Enterovirus NOT DETECTED NOT DETECTED Final   Influenza A NOT DETECTED NOT DETECTED Final   Influenza B NOT DETECTED NOT DETECTED Final   Parainfluenza Virus 1 NOT DETECTED NOT DETECTED Final   Parainfluenza Virus  2 NOT DETECTED NOT DETECTED Final   Parainfluenza Virus 3 NOT DETECTED NOT DETECTED Final   Parainfluenza Virus 4 NOT DETECTED NOT DETECTED Final   Respiratory Syncytial Virus NOT DETECTED NOT DETECTED Final   Bordetella pertussis NOT DETECTED NOT DETECTED Final   Chlamydophila pneumoniae NOT DETECTED NOT DETECTED Final   Mycoplasma pneumoniae NOT DETECTED NOT DETECTED Final    Comment: Performed at [facility], [address] [place], [place] [address]     Labs: BNP (last 3 results) Recent Labs    06/21/20 0501  BNP 52.4   Basic Metabolic Panel: Recent Labs  Lab 06/16/20 1336 06/16/20 1336 06/19/20 1538 06/20/20 2139 06/21/20 0501 06/22/20 0859 06/23/20 0354  NA 133*  --  137  --  137 139 139  K 3.2*  --  3.4*  --  4.0 3.9 3.8  CL 98  --  100  --  105 105 105  CO2 25  --  25  --  23 24 24   GLUCOSE 233*  --  185*  --  307* 321* 221*  BUN 13  --  12  --  20 22* 24*  CREATININE 0.88   < > 0.76 0.90 0.78 0.90 0.81  CALCIUM 7.6*  --  8.1*  --  8.3* 8.3* 8.3*  MG  --   --   --   --  2.0  --   --    < > = values in this interval not displayed.   Liver Function Tests: Recent Labs  Lab 06/16/20 1336 06/19/20 1538 06/21/20 0501 06/22/20 0859 06/23/20 0354  AST 33 42* 19 14* 12*  ALT 33 40 31 26 25   ALKPHOS 101 91 86 87 91  BILITOT 0.4 0.5 0.5 0.8 0.6  PROT 7.1 7.1 6.4* 6.4* 6.3*  ALBUMIN 3.3* 3.3* 2.8* 2.7* 2.7*   Recent Labs  Lab 06/16/20 1336  LIPASE 28   No results for input(s): AMMONIA in the last 168 hours. CBC: Recent Labs  Lab 06/16/20 1336 06/16/20 1336 06/19/20 1538 06/20/20 2139 06/21/20 0501 06/22/20 0859 06/23/20  0354  WBC 3.8*   < > 5.6 9.1 7.5 10.1 12.3*  NEUTROABS 2.3  --  3.8  --  6.8 8.4* 10.7*  HGB 15.2*   < > 15.6* 14.9 14.4 14.3 15.1*  HCT 46.9*   < > 47.3* 46.1* 44.8 44.0 45.7  MCV 94.7   < > 92.4 93.3 92.0 92.4 92.7  PLT 151   < > 174 215 233 283 315   < > = values in this interval not displayed.   Cardiac Enzymes: No  results for input(s): CKTOTAL, CKMB, CKMBINDEX, TROPONINI in the last 168 hours. BNP: Invalid input(s): POCBNP CBG: Recent Labs  Lab 06/22/20 1249 06/22/20 1558 06/22/20 1836 06/22/20 2126 06/23/20 0744  GLUCAP 276* 296* 312* 199* 255*   D-Dimer Recent Labs    06/22/20 0859 06/23/20 0354  DDIMER 0.51* 0.84*   Hgb A1c Recent Labs    06/20/20 2143  HGBA1C 7.3*   Lipid Profile No results for input(s): CHOL, HDL, LDLCALC, TRIG, CHOLHDL, LDLDIRECT in the last 72 hours. Thyroid function studies No results for input(s): TSH, T4TOTAL, T3FREE, THYROIDAB in the last 72 hours.  Invalid input(s): FREET3 Anemia work up Recent Labs    06/22/20 0859 06/23/20 0354  FERRITIN 455* 430*   Urinalysis    Component Value Date/Time   COLORURINE YELLOW 06/16/2020 1550   APPEARANCEUR CLEAR 06/16/2020 1550   LABSPEC 1.025 06/16/2020 1550   PHURINE 6.0 06/16/2020 1550   GLUCOSEU NEGATIVE 06/16/2020 1550   HGBUR NEGATIVE 06/16/2020 1550   BILIRUBINUR NEGATIVE 06/16/2020 1550   KETONESUR NEGATIVE 06/16/2020 1550   PROTEINUR NEGATIVE 06/16/2020 1550   UROBILINOGEN 1.0 05/11/2014 1323   NITRITE NEGATIVE 06/16/2020 1550   LEUKOCYTESUR NEGATIVE 06/16/2020 1550   Sepsis Labs Invalid input(s): PROCALCITONIN,  WBC,  LACTICIDVEN Microbiology Recent Results (from the past 240 hour(s))  SARS Coronavirus 2 by RT PCR (hospital order, performed in [facility] hospital lab) Nasopharyngeal Nasopharyngeal Swab     Status: Abnormal   Collection Time: 06/16/20 12:40 PM   Specimen: Nasopharyngeal Swab  Result Value Ref Range Status   SARS Coronavirus 2 POSITIVE (A) NEGATIVE Final    Comment: RESULT CALLED TO, READ BACK BY AND VERIFIED WITH: CALLED TO M.SIMS RN AT 1334 ON [id number] BY SROY (NOTE) SARS-CoV-2 target nucleic acids are DETECTED  SARS-CoV-2 RNA is generally detectable in upper respiratory specimens  during the acute phase of infection.  Positive results are indicative  of the presence  of the identified virus, but do not rule out bacterial infection or co-infection with other pathogens not detected by the test.  Clinical correlation with patient history and  other diagnostic information is necessary to determine patient infection status.  The expected result is negative.  Fact Sheet for Patients:   BoilerBrush.com.cy   Fact Sheet for Healthcare Providers:   https://pope.com/    This test is not yet approved or cleared by the [place] FDA and  has been authorized for detection and/or diagnosis of SARS-CoV-2 by FDA under an Emergency Use Authorization (EUA).  This EUA will remain in effect (mean ing this test can be used) for the duration of  the COVID-19 declaration under Section 564(b)(1) of the Act, 21 U.S.C. section 360-bbb-3(b)(1), unless the authorization is terminated or revoked sooner.  Performed at [facility], [address] [place], [place] [address]   Blood Culture (routine x 2)     Status: None (Preliminary result)   Collection Time: 06/19/20  3:38 PM  Specimen: Right Antecubital; Blood  Result Value Ref Range Status   Specimen Description   Final    RIGHT ANTECUBITAL Performed at [facility], [address] [place], [place] [address]    Special Requests   Final    BOTTLES DRAWN AEROBIC AND ANAEROBIC Blood Culture adequate volume Performed at [facility], [address] [place], [place] [address]    Culture   Final    NO GROWTH 3 DAYS Performed at [facility], [address] [place], [place] [address]    Report Status PENDING  Incomplete  Blood Culture (routine x 2)     Status: None (Preliminary result)   Collection Time: 06/19/20  3:42 PM   Specimen: BLOOD RIGHT FOREARM  Result Value Ref Range Status   Specimen Description   Final    BLOOD RIGHT FOREARM Performed at [facility], [address] [place], [place] [address]     Special Requests   Final    BOTTLES DRAWN AEROBIC AND ANAEROBIC Blood Culture adequate volume Performed at [facility], [address] [place], [place] [address]    Culture   Final    NO GROWTH 3 DAYS Performed at [facility], [address], [place], [place] [address]    Report Status PENDING  Incomplete  Respiratory Panel by PCR     Status: None   Collection Time: 06/21/20  1:25 PM   Specimen: Nasopharyngeal Swab; Respiratory  Result Value Ref Range Status   Adenovirus NOT DETECTED NOT DETECTED Final   Coronavirus 229E NOT DETECTED NOT DETECTED Final    Comment: (NOTE) The Coronavirus on the Respiratory Panel, DOES NOT test for the novel  Coronavirus (2019 nCoV)    Coronavirus HKU1 NOT DETECTED NOT DETECTED Final   Coronavirus NL63 NOT DETECTED NOT DETECTED Final   Coronavirus OC43 NOT DETECTED NOT DETECTED Final   Metapneumovirus NOT DETECTED NOT DETECTED Final   Rhinovirus / Enterovirus NOT DETECTED NOT DETECTED Final   Influenza A NOT DETECTED NOT DETECTED Final   Influenza B NOT DETECTED NOT DETECTED Final   Parainfluenza Virus 1 NOT DETECTED NOT DETECTED Final   Parainfluenza Virus 2 NOT DETECTED NOT DETECTED Final   Parainfluenza Virus 3 NOT DETECTED NOT DETECTED Final   Parainfluenza Virus 4 NOT DETECTED NOT DETECTED Final   Respiratory Syncytial Virus NOT DETECTED NOT DETECTED Final   Bordetella pertussis NOT DETECTED NOT DETECTED Final   Chlamydophila pneumoniae NOT DETECTED NOT DETECTED Final   Mycoplasma pneumoniae NOT DETECTED NOT DETECTED Final    Comment: Performed at [facility], [address] [place], [place] [address]     Time coordinating discharge: 45 minutes  SIGNED:   Coralie Keens, MD  [facility] 06/23/2020, 10:45 AM

## 2020-06-23 NOTE — Progress Notes (Signed)
Pt SpO2 dropped to 85-87% after sleeping on RA; placed patient on 2 LPM for sleep. Increased SpO2 to 89-90%

## 2020-06-23 NOTE — Plan of Care (Signed)
  Problem: Coping: Goal: Ability to adjust to condition or change in health will improve Outcome: Progressing   Problem: Fluid Volume: Goal: Ability to maintain a balanced intake and output will improve Outcome: Progressing   Problem: Nutritional: Goal: Maintenance of adequate nutrition will improve Outcome: Progressing   Problem: Skin Integrity: Goal: Risk for impaired skin integrity will decrease Outcome: Progressing   

## 2020-06-24 LAB — CULTURE, BLOOD (ROUTINE X 2)
Culture: NO GROWTH
Culture: NO GROWTH
Special Requests: ADEQUATE
Special Requests: ADEQUATE

## 2020-06-27 ENCOUNTER — Encounter (HOSPITAL_COMMUNITY): Payer: Self-pay | Admitting: Emergency Medicine

## 2020-06-27 ENCOUNTER — Emergency Department (HOSPITAL_COMMUNITY): Payer: HRSA Program

## 2020-06-27 ENCOUNTER — Inpatient Hospital Stay (HOSPITAL_COMMUNITY)
Admission: EM | Admit: 2020-06-27 | Discharge: 2020-07-02 | DRG: 177 | Disposition: A | Discharge status: Home / Self Care | Payer: HRSA Program | Attending: Internal Medicine | Admitting: Internal Medicine

## 2020-06-27 DIAGNOSIS — J1282 Pneumonia due to coronavirus disease 2019: Secondary | ICD-10-CM | POA: Diagnosis not present

## 2020-06-27 DIAGNOSIS — U071 COVID-19: Principal | ICD-10-CM | POA: Diagnosis present

## 2020-06-27 DIAGNOSIS — I5033 Acute on chronic diastolic (congestive) heart failure: Secondary | ICD-10-CM | POA: Diagnosis present

## 2020-06-27 DIAGNOSIS — I82431 Acute embolism and thrombosis of right popliteal vein: Secondary | ICD-10-CM | POA: Diagnosis present

## 2020-06-27 DIAGNOSIS — IMO0002 Reserved for concepts with insufficient information to code with codable children: Secondary | ICD-10-CM | POA: Diagnosis present

## 2020-06-27 DIAGNOSIS — F17210 Nicotine dependence, cigarettes, uncomplicated: Secondary | ICD-10-CM | POA: Diagnosis present

## 2020-06-27 DIAGNOSIS — J189 Pneumonia, unspecified organism: Secondary | ICD-10-CM | POA: Diagnosis not present

## 2020-06-27 DIAGNOSIS — I2510 Atherosclerotic heart disease of native coronary artery without angina pectoris: Secondary | ICD-10-CM | POA: Diagnosis present

## 2020-06-27 DIAGNOSIS — Z87442 Personal history of urinary calculi: Secondary | ICD-10-CM | POA: Diagnosis not present

## 2020-06-27 DIAGNOSIS — I2699 Other pulmonary embolism without acute cor pulmonale: Secondary | ICD-10-CM | POA: Diagnosis present

## 2020-06-27 DIAGNOSIS — Z6837 Body mass index (BMI) 37.0-37.9, adult: Secondary | ICD-10-CM | POA: Diagnosis not present

## 2020-06-27 DIAGNOSIS — Z882 Allergy status to sulfonamides status: Secondary | ICD-10-CM

## 2020-06-27 DIAGNOSIS — I82461 Acute embolism and thrombosis of right calf muscular vein: Secondary | ICD-10-CM | POA: Diagnosis not present

## 2020-06-27 DIAGNOSIS — Z90710 Acquired absence of both cervix and uterus: Secondary | ICD-10-CM

## 2020-06-27 DIAGNOSIS — Z794 Long term (current) use of insulin: Secondary | ICD-10-CM

## 2020-06-27 DIAGNOSIS — I82441 Acute embolism and thrombosis of right tibial vein: Secondary | ICD-10-CM | POA: Diagnosis present

## 2020-06-27 DIAGNOSIS — J9601 Acute respiratory failure with hypoxia: Secondary | ICD-10-CM | POA: Diagnosis not present

## 2020-06-27 DIAGNOSIS — Z880 Allergy status to penicillin: Secondary | ICD-10-CM | POA: Diagnosis not present

## 2020-06-27 DIAGNOSIS — Z720 Tobacco use: Secondary | ICD-10-CM | POA: Diagnosis present

## 2020-06-27 DIAGNOSIS — E119 Type 2 diabetes mellitus without complications: Secondary | ICD-10-CM

## 2020-06-27 DIAGNOSIS — T380X5A Adverse effect of glucocorticoids and synthetic analogues, initial encounter: Secondary | ICD-10-CM | POA: Diagnosis present

## 2020-06-27 DIAGNOSIS — E669 Obesity, unspecified: Secondary | ICD-10-CM | POA: Diagnosis not present

## 2020-06-27 DIAGNOSIS — Z8249 Family history of ischemic heart disease and other diseases of the circulatory system: Secondary | ICD-10-CM | POA: Diagnosis not present

## 2020-06-27 DIAGNOSIS — I5031 Acute diastolic (congestive) heart failure: Secondary | ICD-10-CM | POA: Diagnosis not present

## 2020-06-27 DIAGNOSIS — I2601 Septic pulmonary embolism with acute cor pulmonale: Secondary | ICD-10-CM | POA: Diagnosis not present

## 2020-06-27 DIAGNOSIS — E1165 Type 2 diabetes mellitus with hyperglycemia: Secondary | ICD-10-CM | POA: Diagnosis not present

## 2020-06-27 DIAGNOSIS — Z79899 Other long term (current) drug therapy: Secondary | ICD-10-CM | POA: Diagnosis not present

## 2020-06-27 DIAGNOSIS — F17200 Nicotine dependence, unspecified, uncomplicated: Secondary | ICD-10-CM | POA: Diagnosis present

## 2020-06-27 DIAGNOSIS — R0602 Shortness of breath: Secondary | ICD-10-CM | POA: Diagnosis present

## 2020-06-27 DIAGNOSIS — E118 Type 2 diabetes mellitus with unspecified complications: Secondary | ICD-10-CM | POA: Diagnosis not present

## 2020-06-27 DIAGNOSIS — J9690 Respiratory failure, unspecified, unspecified whether with hypoxia or hypercapnia: Secondary | ICD-10-CM | POA: Diagnosis present

## 2020-06-27 DIAGNOSIS — J440 Chronic obstructive pulmonary disease with acute lower respiratory infection: Secondary | ICD-10-CM | POA: Diagnosis present

## 2020-06-27 LAB — PROTIME-INR
INR: 1.6 — ABNORMAL HIGH (ref 0.8–1.2)
Prothrombin Time: 18.4 seconds — ABNORMAL HIGH (ref 11.4–15.2)

## 2020-06-27 LAB — URINALYSIS, ROUTINE W REFLEX MICROSCOPIC
Bacteria, UA: NONE SEEN
Bilirubin Urine: NEGATIVE
Glucose, UA: 50 mg/dL — AB
Ketones, ur: 5 mg/dL — AB
Leukocytes,Ua: NEGATIVE
Nitrite: NEGATIVE
Protein, ur: NEGATIVE mg/dL
Specific Gravity, Urine: >1.046 — ABNORMAL HIGH (ref 1.005–1.030)
pH: 6 (ref 5.0–8.0)

## 2020-06-27 LAB — COMPREHENSIVE METABOLIC PANEL
ALT: 14 U/L (ref 0–44)
AST: 12 U/L — ABNORMAL LOW (ref 15–41)
Albumin: 2.5 g/dL — ABNORMAL LOW (ref 3.5–5.0)
Alkaline Phosphatase: 112 U/L (ref 38–126)
Anion gap: 14 (ref 5–15)
BUN: 12 mg/dL (ref 6–20)
CO2: 21 mmol/L — ABNORMAL LOW (ref 22–32)
Calcium: 8.3 mg/dL — ABNORMAL LOW (ref 8.9–10.3)
Chloride: 98 mmol/L (ref 98–111)
Creatinine, Ser: 1.08 mg/dL — ABNORMAL HIGH (ref 0.44–1.00)
GFR calc Af Amer: >60 mL/min (ref >60)
GFR calc non Af Amer: 57 mL/min — ABNORMAL LOW (ref >60)
Glucose, Bld: 247 mg/dL — ABNORMAL HIGH (ref 70–99)
Potassium: 4.1 mmol/L (ref 3.5–5.1)
Sodium: 133 mmol/L — ABNORMAL LOW (ref 135–145)
Total Bilirubin: 1.3 mg/dL — ABNORMAL HIGH (ref 0.3–1.2)
Total Protein: 7.1 g/dL (ref 6.5–8.1)

## 2020-06-27 LAB — CBC WITH DIFFERENTIAL/PLATELET
Abs Immature Granulocytes: 0.25 10*3/uL — ABNORMAL HIGH (ref 0.00–0.07)
Basophils Absolute: 0 10*3/uL (ref 0.0–0.1)
Basophils Relative: 0 %
Eosinophils Absolute: 0 10*3/uL (ref 0.0–0.5)
Eosinophils Relative: 0 %
HCT: 48.4 % — ABNORMAL HIGH (ref 36.0–46.0)
Hemoglobin: 15.4 g/dL — ABNORMAL HIGH (ref 12.0–15.0)
Immature Granulocytes: 1 %
Lymphocytes Relative: 4 %
Lymphs Abs: 0.8 10*3/uL (ref 0.7–4.0)
MCH: 30.4 pg (ref 26.0–34.0)
MCHC: 31.8 g/dL (ref 30.0–36.0)
MCV: 95.5 fL (ref 80.0–100.0)
Monocytes Absolute: 1.9 10*3/uL — ABNORMAL HIGH (ref 0.1–1.0)
Monocytes Relative: 10 %
Neutro Abs: 17 10*3/uL — ABNORMAL HIGH (ref 1.7–7.7)
Neutrophils Relative %: 85 %
Platelets: 281 10*3/uL (ref 150–400)
RBC: 5.07 MIL/uL (ref 3.87–5.11)
RDW: 13.6 % (ref 11.5–15.5)
WBC: 20 10*3/uL — ABNORMAL HIGH (ref 4.0–10.5)
nRBC: 0 % (ref 0.0–0.2)

## 2020-06-27 LAB — LACTIC ACID, PLASMA: Lactic Acid, Venous: 1.9 mmol/L (ref 0.5–1.9)

## 2020-06-27 LAB — I-STAT BETA HCG BLOOD, ED (MC, WL, AP ONLY): I-stat hCG, quantitative: 11.9 m[IU]/mL — ABNORMAL HIGH (ref <5)

## 2020-06-27 LAB — SARS CORONAVIRUS 2 BY RT PCR (HOSPITAL ORDER, PERFORMED IN ~~LOC~~ HOSPITAL LAB): SARS Coronavirus 2: POSITIVE — AB

## 2020-06-27 MED ORDER — INSULIN GLARGINE 100 UNIT/ML ~~LOC~~ SOLN
29.0000 [IU] | Freq: Every day | SUBCUTANEOUS | Status: DC
  Administered 2020-06-28: 29 [IU] via SUBCUTANEOUS
  Filled 2020-06-27 (×3): qty 0.29

## 2020-06-27 MED ORDER — HEPARIN (PORCINE) 25000 UT/250ML-% IV SOLN: 16.0000 [IU]/kg/h | INTRAVENOUS | Status: DC

## 2020-06-27 MED ORDER — SODIUM CHLORIDE 0.9% FLUSH
3.0000 mL | Freq: Once | INTRAVENOUS | Status: AC
  Administered 2020-06-27: 3 mL via INTRAVENOUS

## 2020-06-27 MED ORDER — HEPARIN BOLUS VIA INFUSION: 4000.0000 [IU] | Freq: Once | INTRAVENOUS | Status: DC

## 2020-06-27 MED ORDER — SODIUM CHLORIDE 0.9% FLUSH
3.0000 mL | Freq: Two times a day (BID) | INTRAVENOUS | Status: DC
  Administered 2020-06-27 – 2020-07-02 (×10): 3 mL via INTRAVENOUS

## 2020-06-27 MED ORDER — DEXAMETHASONE 4 MG PO TABS
6.0000 mg | ORAL_TABLET | ORAL | Status: DC
  Administered 2020-06-28: 6 mg via ORAL
  Filled 2020-06-27: qty 2

## 2020-06-27 MED ORDER — SODIUM CHLORIDE 0.9% FLUSH: 3.0000 mL | INTRAVENOUS | Status: DC | PRN

## 2020-06-27 MED ORDER — SODIUM CHLORIDE 0.9 % IV SOLN
500.0000 mg | Freq: Once | INTRAVENOUS | Status: DC
  Filled 2020-06-27: qty 500

## 2020-06-27 MED ORDER — IOHEXOL 350 MG/ML SOLN
100.0000 mL | Freq: Once | INTRAVENOUS | Status: AC | PRN
  Administered 2020-06-27: 100 mL via INTRAVENOUS

## 2020-06-27 MED ORDER — SODIUM CHLORIDE 0.9 % IV BOLUS
1000.0000 mL | Freq: Once | INTRAVENOUS | Status: AC
  Administered 2020-06-27: 1000 mL via INTRAVENOUS

## 2020-06-27 MED ORDER — INSULIN ASPART 100 UNIT/ML ~~LOC~~ SOLN
0.0000 [IU] | Freq: Three times a day (TID) | SUBCUTANEOUS | Status: DC
  Administered 2020-06-28: 11 [IU] via SUBCUTANEOUS
  Administered 2020-06-28: 8 [IU] via SUBCUTANEOUS
  Administered 2020-06-28: 3 [IU] via SUBCUTANEOUS
  Administered 2020-06-29: 11 [IU] via SUBCUTANEOUS
  Administered 2020-06-29: 15 [IU] via SUBCUTANEOUS
  Administered 2020-06-29: 11 [IU] via SUBCUTANEOUS

## 2020-06-27 MED ORDER — SODIUM CHLORIDE 0.9 % IV SOLN
1.0000 g | Freq: Once | INTRAVENOUS | Status: AC
  Administered 2020-06-27: 1 g via INTRAVENOUS
  Filled 2020-06-27: qty 10

## 2020-06-27 MED ORDER — IPRATROPIUM-ALBUTEROL 0.5-2.5 (3) MG/3ML IN SOLN: 3.0000 mL | RESPIRATORY_TRACT | Status: DC | PRN

## 2020-06-27 MED ORDER — HEPARIN BOLUS VIA INFUSION
5000.0000 [IU] | Freq: Once | INTRAVENOUS | Status: AC
  Administered 2020-06-27: 5000 [IU] via INTRAVENOUS
  Filled 2020-06-27: qty 5000

## 2020-06-27 MED ORDER — HEPARIN (PORCINE) 25000 UT/250ML-% IV SOLN
1400.0000 [IU]/h | INTRAVENOUS | Status: AC
  Administered 2020-06-27 – 2020-06-30 (×3): 1400 [IU]/h via INTRAVENOUS
  Filled 2020-06-27 (×5): qty 250

## 2020-06-27 MED ORDER — SODIUM CHLORIDE 0.9 % IV SOLN: 250.0000 mL | INTRAVENOUS | Status: DC | PRN

## 2020-06-27 MED ORDER — MORPHINE SULFATE (PF) 4 MG/ML IV SOLN
4.0000 mg | Freq: Once | INTRAVENOUS | Status: AC
  Administered 2020-06-28: 4 mg via INTRAVENOUS
  Filled 2020-06-27: qty 1

## 2020-06-27 NOTE — ED Notes (Signed)
Pt resting at this time with eyes closed.  No complaints voiced  Call light at bedside

## 2020-06-27 NOTE — ED Provider Notes (Signed)
[facility] EMERGENCY DEPARTMENT Provider Note   CSN: [id number] Arrival date & time: 06/27/20  1146     History Chief Complaint  Patient presents with  . Shortness of Breath    covid  . Weakness    Jill Barnes is a 59 y.o. adult.  HPI  Patient presents to the emergency department from home via EMS for increasing shortness of breath and generalized weakness.  Patient had recent hospital admission for COVID-19 pneumonia.  Admitted on 7/29 and subsequently discharged on 8/2.  COVID-19 positive as of 7/26.  She she notes she felt symptoms several days prior to the test.  Patient reports since discharge, she has had worsening shortness of breath.  History of COPD and diabetes.  She denies any history of blood clots.  She endorses some left chest wall pain and difficulty with cough/taking deep breaths.  She denies any midsternal chest pain.  No radiation.  While in the hospital, the patient received remdesivir and Decadron for treatment of her viral pneumonia. Hypoxic with EMS, 88% on room air.      Past Medical History:  Diagnosis Date  . COPD (chronic obstructive pulmonary disease) (HCC)   . Diabetes mellitus without complication (HCC)   . Kidney stone     Patient Active Problem List   Diagnosis Date Noted  . Bilateral pulmonary embolism (HCC) 06/27/2020  . Respiratory failure (HCC) 06/27/2020  . Pulmonary embolism (HCC) 06/27/2020  . Acute hypoxemic respiratory failure due to COVID-19 (HCC) 06/20/2020  . Type 2 diabetes mellitus without complication (HCC) 06/20/2020  . Hypokalemia 06/20/2020  . Acute respiratory failure with hypoxia (HCC) 06/19/2020  . Hypoxia 12/15/2017  . COPD exacerbation (HCC) 12/15/2017  . Tachycardia 12/15/2017  . Nicotine dependence 12/15/2017    Past Surgical History:  Procedure Laterality Date  . ABDOMINAL HYSTERECTOMY    . BLADDER SURGERY       OB History   No obstetric history on file.     Family History    Problem Relation Age of Onset  . Heart failure Father     Social History   Tobacco Use  . Smoking status: Current Every Day Smoker    Packs/day: 1.00  . Smokeless tobacco: Never Used  Vaping Use  . Vaping Use: Never used  Substance Use Topics  . Alcohol use: No  . Drug use: No    Home Medications Prior to Admission medications   Medication Sig Start Date End Date Taking? Authorizing Provider  albuterol (PROVENTIL HFA;VENTOLIN HFA) 108 (90 Base) MCG/ACT inhaler Inhale 2 puffs into the lungs every 6 (six) hours as needed for wheezing or shortness of breath. 12/18/17   Albertine Grates, MD  atorvastatin (LIPITOR) 20 MG tablet Take 20 mg by mouth daily. 05/21/20   [provider]  glipiZIDE (GLUCOTROL XL) 10 MG 24 hr tablet Take 10 mg by mouth daily with breakfast.    [provider]  guaiFENesin-dextromethorphan (ROBITUSSIN DM) 100-10 MG/5ML syrup Take 10 mLs by mouth every 6 (six) hours as needed for cough. 06/23/20   Arrien, York Ram, MD  ibuprofen (ADVIL) 200 MG tablet Take 800 mg by mouth every 6 (six) hours as needed for fever or mild pain.    [provider]  insulin NPH-regular Human (NOVOLIN 70/30 RELION) (70-30) 100 UNIT/ML injection Inject 15 Units into the skin in the morning and at bedtime.    [provider]  pioglitazone (ACTOS) 45 MG tablet Take 45 mg by mouth daily. 04/26/20  [provider]    Allergies    Sulfa antibiotics and Penicillins  Review of Systems   Review of Systems  Constitutional: Positive for fatigue. Negative for chills and fever.  HENT: Negative for ear pain and sore throat.   Eyes: Negative for pain and visual disturbance.  Respiratory: Positive for shortness of breath. Negative for cough.   Cardiovascular: Positive for chest pain. Negative for palpitations.  Gastrointestinal: Negative for abdominal pain and vomiting.  Genitourinary: Negative for dysuria and hematuria.  Musculoskeletal: Negative for  arthralgias and back pain.  Skin: Negative for color change and rash.  Neurological: Positive for weakness. Negative for seizures and syncope.  All other systems reviewed and are negative.   Physical Exam Updated Vital Signs BP 116/79   Pulse (!) 102   Temp 97.8 F (36.6 C) (Oral)   Resp (!) 23   Ht 5\' 6"  (1.676 m)   Wt 104.3 kg   SpO2 96%   BMI 37.12 kg/m   Physical Exam Vitals and nursing note reviewed.  Constitutional:      General: She is not in acute distress.    Appearance: She is well-developed and normal weight. She is ill-appearing. She is not toxic-appearing.  HENT:     Head: Normocephalic and atraumatic.  Eyes:     Extraocular Movements: Extraocular movements intact.     Conjunctiva/sclera: Conjunctivae normal.     Pupils: Pupils are equal, round, and reactive to light.  Cardiovascular:     Rate and Rhythm: Regular rhythm. Tachycardia present.     Heart sounds: No murmur heard.   Pulmonary:     Effort: Pulmonary effort is normal. Tachypnea present. No respiratory distress.     Breath sounds: Rhonchi present.  Chest:     Chest wall: Tenderness present.  Abdominal:     Palpations: Abdomen is soft.     Tenderness: There is no abdominal tenderness.  Musculoskeletal:     Cervical back: Normal range of motion and neck supple.     Right lower leg: No edema.     Left lower leg: No edema.  Skin:    General: Skin is warm and dry.     Capillary Refill: Capillary refill takes less than 2 seconds.  Neurological:     General: No focal deficit present.     Mental Status: She is alert and oriented to person, place, and time.  Psychiatric:        Mood and Affect: Mood normal.        Behavior: Behavior normal.     ED Results / Procedures / Treatments   Labs (all labs ordered are listed, but only abnormal results are displayed) Labs Reviewed  SARS CORONAVIRUS 2 BY RT PCR (HOSPITAL ORDER, PERFORMED IN [facility]) - Abnormal; Notable for the following  components:      Result Value   SARS Coronavirus 2 POSITIVE (*)    All other components within normal limits  COMPREHENSIVE METABOLIC PANEL - Abnormal; Notable for the following components:   Sodium 133 (*)    CO2 21 (*)    Glucose, Bld 247 (*)    Creatinine, Ser 1.08 (*)    Calcium 8.3 (*)    Albumin 2.5 (*)    AST 12 (*)    Total Bilirubin 1.3 (*)    GFR calc non Af Amer 57 (*)    All other components within normal limits  CBC WITH DIFFERENTIAL/PLATELET - Abnormal; Notable for the following components:  WBC 20.0 (*)    Hemoglobin 15.4 (*)    HCT 48.4 (*)    Neutro Abs 17.0 (*)    Monocytes Absolute 1.9 (*)    Abs Immature Granulocytes 0.25 (*)    All other components within normal limits  I-STAT BETA HCG BLOOD, ED ([facility], [facility], [facility] ONLY) - Abnormal; Notable for the following components:   I-stat hCG, quantitative 11.9 (*)    All other components within normal limits  CULTURE, BLOOD (ROUTINE X 2)  CULTURE, BLOOD (ROUTINE X 2)  LACTIC ACID, PLASMA  LACTIC ACID, PLASMA  URINALYSIS, ROUTINE W REFLEX MICROSCOPIC  CBC  PROTIME-INR  APTT  HEPARIN LEVEL (UNFRACTIONATED)  TROPONIN I (HIGH SENSITIVITY)  TROPONIN I (HIGH SENSITIVITY)    EKG EKG Interpretation  Date/Time:  Friday June 27 2020 11:49:39 EDT Ventricular Rate:  99 PR Interval:  128 QRS Duration: 84 QT Interval:  344 QTC Calculation: 441 R Axis:   49 Text Interpretation: Normal sinus rhythm Nonspecific ST and T wave abnormality Abnormal ECG similar to priors Confirmed by Pricilla Loveless [phone]) on 06/27/2020 12:00:25 PM   Radiology CT Angio Chest PE W and/or Wo Contrast  Result Date: 06/27/2020 CLINICAL DATA:  PE suspected, recent COVID-19 positivity EXAM: CT ANGIOGRAPHY CHEST WITH CONTRAST TECHNIQUE: Multidetector CT imaging of the chest was performed using the standard protocol during bolus administration of intravenous contrast. Multiplanar CT image reconstructions and MIPs were obtained to evaluate the vascular  anatomy. CONTRAST:  OMNIPAQUE IOHEXOL 350 MG/ML SOLN COMPARISON:  Radiograph 06/27/2020 FINDINGS: Cardiovascular: Evaluation of the pulmonary arteries is limited by suboptimal contrast opacification (180 HU within the pulmonary trunk) and extensive respiratory motion artifact throughout the lungs most pronounced towards the lung bases. There is visible hypodense filling defect extending from the distal right inter lobar pulmonary artery into the right lower lobar and superior segmental pulmonary arteries in the in the right lower lobe (12/108). Some additional left pulmonary arterial filling defect is seen extending from the distal left main pulmonary artery into the lobar and segmental branches of the left upper lobe (12/91-80). Possible filling defect in the inferior lingular segmental artery (12/133). Central pulmonary arteries are normal caliber. No elevation of the RV/LV ratio (0.7). The heart is borderline enlarged. Three-vessel coronary artery disease is noted. Mediastinum/Nodes: No mediastinal fluid or gas. Normal thyroid gland and thoracic inlet. No acute abnormality of the trachea or esophagus. Scattered subcentimeter low-attenuation mediastinal and hilar adenopathy. Additional borderline enlarged 11 mm left hilar node. Lungs/Pleura: There are patchy multifocal areas of mixed consolidation and ground-glass within the lungs. However, there is a region suggestive of an atoll sign in the inferior lingula corresponding to the distribution of 1 of the suspected pulmonary artery emboli which could reflect a region of wedge pulmonary infarct. Hypoventilatory changes are noted as well likely accentuated by imaging during exhalation. No pneumothorax or effusion. Upper Abdomen: No acute abnormalities present in the visualized portions of the upper abdomen. Musculoskeletal: Multilevel degenerative changes are present in the imaged portions of the spine. No acute osseous abnormality or suspicious osseous lesion.  Review of the MIP images confirms the above findings. IMPRESSION: 1. Evaluation of the pulmonary arteries is limited by suboptimal contrast opacification and extensive respiratory motion artifact throughout the lungs most pronounced towards the lung bases. Pulmonary artery emboli extend from the right intralobar pulmonary artery into the lower lobar and superior segmental pulmonary arteries. Additional filling defects in the distal left main pulmonary artery extending into the left upper lobar and apical  segmental branches as well as within the inferior lingular segmental branch. 2. No evidence of right heart strain. 3. Patchy multifocal areas of mixed consolidation and ground-glass within the lungs, compatible with pneumonia given recent COVID-19 positivity. 4. Peripheral consolidation with central ground-glass compatible with an atoll sign in the inferior lingula in the distribution of a pulmonary embolus could reflect a developing pulmonary infarct. 5. Scattered low-attenuation subcentimeter nodes with a borderline enlarged AP window lymph node, likely reactive. 6. Three-vessel coronary artery disease. 7. Aortic Atherosclerosis (ICD10-I70.0). Critical Value/emergent results were called by telephone at the time of interpretation on 06/27/2020 at 9:00 pm to provider JULIE HAVILAND , who verbally acknowledged these results. Electronically Signed   By: Kreg ShropshirePrice  DeHay M.D.   On: 06/27/2020 21:00   DG Chest Portable 1 View  Result Date: 06/27/2020 CLINICAL DATA:  History of COVID-19 positivity recently with increasing shortness of breath EXAM: PORTABLE CHEST 1 VIEW COMPARISON:  06/19/2020 FINDINGS: Cardiac shadow is stable. The lungs are well aerated bilaterally. Patchy opacity is noted in the bases bilaterally worse on the left than the right consistent with multifocal pneumonia. This may be sequelae from the prior COVID-19 infection. No bony abnormality is noted. IMPRESSION: Bibasilar opacities left greater than right  consistent with multifocal pneumonia. This is likely sequelae from the patient's prior COVID-19 positivity. Electronically Signed   By: Alcide CleverMark  Lukens M.D.   On: 06/27/2020 12:15    Procedures Procedures (including critical care time)  Medications Ordered in ED Medications  sodium chloride flush (NS) 0.9 % injection 3 mL (has no administration in time range)  morphine 4 MG/ML injection 4 mg (0 mg Intravenous Hold 06/27/20 1607)  cefTRIAXone (ROCEPHIN) 1 g in sodium chloride 0.9 % 100 mL IVPB (1 g Intravenous New Bag/Given 06/27/20 2217)  azithromycin (ZITHROMAX) 500 mg in sodium chloride 0.9 % 250 mL IVPB (has no administration in time range)  heparin ADULT infusion 100 units/mL (25000 units/23350mL sodium chloride 0.45%) (1,400 Units/hr Intravenous New Bag/Given 06/27/20 2219)  sodium chloride 0.9 % bolus 1,000 mL (0 mLs Intravenous Stopped 06/27/20 1718)  iohexol (OMNIPAQUE) 350 MG/ML injection 100 mL (100 mLs Intravenous Contrast Given 06/27/20 2041)  heparin bolus via infusion 5,000 Units (5,000 Units Intravenous Bolus from Bag 06/27/20 2220)    ED Course   Jill Barnes is a 59 y.o. adult with PMHx listed that presents to the Emergency Department complaint of Shortness of Breath (covid) and Weakness  ED Course: Initial exam completed.   Ill-appearing but hemodynamically stable.  Potentially toxic.  Afebrile.  Physical exam significant for age-appropriate 59 year old female with obesity, diminished breath sounds with some mild rhonchi/wheezing, tachypnea, and no evidence of lower extremity edema with extremities perfused.  Initial differential includes viral pneumonia, secondary bacterial pneumonia, sepsis, other sources of infection including cystitis/pyelonephritis, electrolyte abnormality, dehydration, and COPD exacerbation along with pulmonary embolism.  Given these concerns, basic labs ordered.  Additionally, given the patient's duration of illness and worsening symptomatology, will obtain CT chest  to rule out pulmonary embolism.  CXR with multifocal pneumonia likely sequelae from COVID-19 positivity. 1L normal saline for resuscitation. CT CBC with evidence of worsening leukocytosis 20.0 and stable hemoglobin 15.4.  CMP hyponatremia 133 and elevated creat 1.08 slowly uptrending.  Lactic acid 1.9.  Blood cultures pending x2. Remains COVID-19 positive.   CT PE positive for multiple findings including bilateral pulmonary embolism extending from the interlobar arteries through segmental arteries bilaterally.  Multifocal pneumonia.  And concern for pulmonary infarct in the inferior  lingula.  No evidence of right heart strain on CT but troponin added.  Given these findings, heparinization initiated.  Unclear whether multifocal pneumonia changes are secondary to expected post viral etiology versus secondary bacterial infection, will treat with ceftriaxone and azithromycin.  Discussed with the hospitalist team who agreed admit the patient to their service for further management.  Diagnostics Vital Signs: reviewed Labs: reviewed and significant findings discussed above Imaging: personally reviewed images interpreted by radiology EKG: reviewed Records: nursing notes along with previous records reviewed and pertinent data discussed   Consults:  Hospitalist   Reevaluation/Disposition:  Upon reevaluation, patients symptoms stable.   All questions answered.  Patient and/or family was understanding and in agreement with today's assessment and plan.   Campbell Riches, MD Emergency Medicine, PGY-3   Note: Dragon medical dictation software was used in the creation of this note.  Final Clinical Impression(s) / ED Diagnoses Final diagnoses:  Multifocal pneumonia  Acute respiratory failure with hypoxia (HCC)  Bilateral pulmonary embolism [facility])    Rx / DC Orders ED Discharge Orders    None       Nino Parsley, MD 06/28/20 0000    Benjiman Core, MD 07/01/20 1436

## 2020-06-27 NOTE — ED Triage Notes (Signed)
Arrives via [facility], Recently discharged from hospital after being admitted for covid, tested negative prior to being discharged, recently increasing sob, generalized weakness. 88% O2 sat on room air with EMS. Also diagnosed with pna while in hospital, not sent home with abx.

## 2020-06-27 NOTE — Progress Notes (Signed)
ANTICOAGULATION CONSULT NOTE - Initial Consult  Pharmacy Consult for heparin Indication: pulmonary embolus  Allergies  Allergen Reactions  . Sulfa Antibiotics Shortness Of Breath and Swelling    All-over swelling  . Penicillins Rash    Patient Measurements: Height: 5\' 6"  (167.6 cm) Weight: 104.3 kg (230 lb) IBW/kg (Calculated) : 59.3 Heparin Dosing Weight: 83.2 kg  Vital Signs: Temp: 97.8 F (36.6 C) (08/06 1831) Temp Source: Oral (08/06 1831) BP: 109/60 (08/06 1814) Pulse Rate: 102 (08/06 1814)  Labs: Recent Labs    06/27/20 1550  HGB 15.4*  HCT 48.4*  PLT 281  CREATININE 1.08*    Estimated Creatinine Clearance (by C-G formula based on SCr of 1.08 mg/dL (H)) Female: 08/27/20 mL/min (A) Female: 84.4 mL/min (A)   Medical History: Past Medical History:  Diagnosis Date  . COPD (chronic obstructive pulmonary disease) (HCC)   . Diabetes mellitus without complication (HCC)   . Kidney stone     Medications:  Scheduled:  . heparin  5,000 Units Intravenous Once  .  morphine injection  4 mg Intravenous Once  . sodium chloride flush  3 mL Intravenous Once   Infusions:  . azithromycin (ZITHROMAX) 500 MG IVPB (Vial-Mate [employer])    . cefTRIAXone (ROCEPHIN)  IV    . heparin     PRN:   Assessment: 59 yo female presents with increased shortness of breath and generalized weakness. A CT was done and the patient was found to have a pulmonary embolism without right heart strain. PTA the patient was not on any anticoagulants. The patient's CBC is WNL and there are no signs and symptoms of bleeding. Pharmacy is now consulted to start IV heparin for pulmonary embolism.  Goal of Therapy:  Heparin level 0.3-0.7 units/ml Monitor platelets by anticoagulation protocol: Yes   Plan:  Give 5000 units bolus x 1 Start heparin infusion at 1400 units/hr  Monitor ~6 hr heparin level with AM labs Monitor daily heparin level and CBC  Monitor for signs and symptoms of bleeding  41, PharmD, RPh  PGY-1 Pharmacy Resident 06/27/2020 9:16 PM  Please check [email or web address] for unit-specific pharmacy phone numbers.

## 2020-06-27 NOTE — H&P (Signed)
History and Physical    Jill Barnes KGM:[id number] DOB: July 22, 1961 DOA: 06/27/2020  PCP: Patient, No Pcp Per  Patient coming from: home   Chief Complaint: dyspnea  HPI: Jill Barnes is a 59 y.o. adult with medical history significant for dm, copd, and recent covid hospitalization, who presents with the above.  Covid positive 7/26, had symptoms for a couple days prior. Presented to this hospital 7/30 with dyspnea. Found to be hypoxic with covid pneumonia. Treated with o2, steroids, and remdesivir. Completed course of remdisivir. Discharged 8/2. Notes had steady improvement in breathing symptoms since start of covid treatment. However for past 2 days has had worsening shortness of breath. Occasional cough. Some tightness in chest when takes deep breath. No fevers. No n/v/d. No leg swelling. No hx of dvt/PE. Was not vaccinated.  ED Course: labs, imaging, heparin, antibiotics  Review of Systems: As per HPI otherwise 10 point review of systems negative.    Past Medical History:  Diagnosis Date  . COPD (chronic obstructive pulmonary disease) (HCC)   . Diabetes mellitus without complication (HCC)   . Kidney stone     Past Surgical History:  Procedure Laterality Date  . ABDOMINAL HYSTERECTOMY    . BLADDER SURGERY       reports that she has been smoking. She has been smoking about 1.00 pack per day. She has never used smokeless tobacco. She reports that she does not drink alcohol and does not use drugs.  Allergies  Allergen Reactions  . Sulfa Antibiotics Shortness Of Breath and Swelling    All-over swelling  . Penicillins Rash    Family History  Problem Relation Age of Onset  . Heart failure Father     Prior to Admission medications   Medication Sig Start Date End Date Taking? Authorizing Provider  albuterol (PROVENTIL HFA;VENTOLIN HFA) 108 (90 Base) MCG/ACT inhaler Inhale 2 puffs into the lungs every 6 (six) hours as needed for wheezing or shortness of breath. 12/18/17    Albertine Grates, MD  atorvastatin (LIPITOR) 20 MG tablet Take 20 mg by mouth daily. 05/21/20   [provider]  glipiZIDE (GLUCOTROL XL) 10 MG 24 hr tablet Take 10 mg by mouth daily with breakfast.    [provider]  guaiFENesin-dextromethorphan (ROBITUSSIN DM) 100-10 MG/5ML syrup Take 10 mLs by mouth every 6 (six) hours as needed for cough. 06/23/20   Arrien, York Ram, MD  ibuprofen (ADVIL) 200 MG tablet Take 800 mg by mouth every 6 (six) hours as needed for fever or mild pain.    [provider]  insulin NPH-regular Human (NOVOLIN 70/30 RELION) (70-30) 100 UNIT/ML injection Inject 15 Units into the skin in the morning and at bedtime.    [provider]  pioglitazone (ACTOS) 45 MG tablet Take 45 mg by mouth daily. 04/26/20   [provider]    Physical Exam: Vitals:   06/27/20 1558 06/27/20 1744 06/27/20 1814 06/27/20 1831  BP: 106/62 118/62 109/60   Pulse: 98 99 (!) 102   Resp: (!) 25 (!) 22 (!) 25   Temp:    97.8 F (36.6 C)  TempSrc:    Oral  SpO2: 98% 97% 96%   Weight:      Height:        Constitutional: mild distress Head: Atraumatic Eyes: Conjunctiva clear ENM: dry mucous membranes. Neck: Supple Respiratory: scattered rhonchi, mild exp wheeze Cardiovascular: tachycardic, soft systolic rhythm, regular Abdomen: Non-tender, mildly-distended. No masses. No rebound or guarding. Positive bowel sounds.  Musculoskeletal: No joint deformity upper and lower extremities. Normal ROM, no contractures. Normal muscle tone.  Skin: No rashes, lesions, or ulcers.  Extremities: No peripheral edema. Palpable peripheral pulses. No calf swelling/tenderness Neurologic: initially somnolent but arousable. Psychiatric: Normal insight and judgement.   Labs on Admission: I have personally reviewed following labs and imaging studies  CBC: Recent Labs  Lab 06/21/20 0501 06/22/20 0859 06/23/20 0354 06/27/20 1550  WBC 7.5 10.1 12.3* 20.0*  NEUTROABS 6.8  8.4* 10.7* 17.0*  HGB 14.4 14.3 15.1* 15.4*  HCT 44.8 44.0 45.7 48.4*  MCV 92.0 92.4 92.7 95.5  PLT 233 283 315 281   Basic Metabolic Panel: Recent Labs  Lab 06/21/20 0501 06/22/20 0859 06/23/20 0354 06/27/20 1550  NA 137 139 139 133*  K 4.0 3.9 3.8 4.1  CL 105 105 105 98  CO2 23 24 24  21*  GLUCOSE 307* 321* 221* 247*  BUN 20 22* 24* 12  CREATININE 0.78 0.90 0.81 1.08*  CALCIUM 8.3* 8.3* 8.3* 8.3*  MG 2.0  --   --   --    GFR: Estimated Creatinine Clearance (by C-G formula based on SCr of 1.08 mg/dL (H)) Female: 40.969.3 mL/min (A) Female: 84.4 mL/min (A) Liver Function Tests: Recent Labs  Lab 06/21/20 0501 06/22/20 0859 06/23/20 0354 06/27/20 1550  AST 19 14* 12* 12*  ALT 31 26 25 14   ALKPHOS 86 87 91 112  BILITOT 0.5 0.8 0.6 1.3*  PROT 6.4* 6.4* 6.3* 7.1  ALBUMIN 2.8* 2.7* 2.7* 2.5*   No results for input(s): LIPASE, AMYLASE in the last 168 hours. No results for input(s): AMMONIA in the last 168 hours. Coagulation Profile: No results for input(s): INR, PROTIME in the last 168 hours. Cardiac Enzymes: No results for input(s): CKTOTAL, CKMB, CKMBINDEX, TROPONINI in the last 168 hours. BNP (last 3 results) No results for input(s): PROBNP in the last 8760 hours. HbA1C: No results for input(s): HGBA1C in the last 72 hours. CBG: Recent Labs  Lab 06/22/20 1558 06/22/20 1836 06/22/20 2126 06/23/20 0744 06/23/20 1158  GLUCAP 296* 312* 199* 255* 210*   Lipid Profile: No results for input(s): CHOL, HDL, LDLCALC, TRIG, CHOLHDL, LDLDIRECT in the last 72 hours. Thyroid Function Tests: No results for input(s): TSH, T4TOTAL, FREET4, T3FREE, THYROIDAB in the last 72 hours. Anemia Panel: No results for input(s): VITAMINB12, FOLATE, FERRITIN, TIBC, IRON, RETICCTPCT in the last 72 hours. Urine analysis:    Component Value Date/Time   COLORURINE YELLOW 06/16/2020 1550   APPEARANCEUR CLEAR 06/16/2020 1550   LABSPEC 1.025 06/16/2020 1550   PHURINE 6.0 06/16/2020 1550    GLUCOSEU NEGATIVE 06/16/2020 1550   HGBUR NEGATIVE 06/16/2020 1550   BILIRUBINUR NEGATIVE 06/16/2020 1550   KETONESUR NEGATIVE 06/16/2020 1550   PROTEINUR NEGATIVE 06/16/2020 1550   UROBILINOGEN 1.0 05/11/2014 1323   NITRITE NEGATIVE 06/16/2020 1550   LEUKOCYTESUR NEGATIVE 06/16/2020 1550    Radiological Exams on Admission: CT Angio Chest PE W and/or Wo Contrast  Result Date: 06/27/2020 CLINICAL DATA:  PE suspected, recent COVID-19 positivity EXAM: CT ANGIOGRAPHY CHEST WITH CONTRAST TECHNIQUE: Multidetector CT imaging of the chest was performed using the standard protocol during bolus administration of intravenous contrast. Multiplanar CT image reconstructions and MIPs were obtained to evaluate the vascular anatomy. CONTRAST:  100mL OMNIPAQUE IOHEXOL 350 MG/ML SOLN COMPARISON:  Radiograph 06/27/2020 FINDINGS: Cardiovascular: Evaluation of the pulmonary arteries is limited by suboptimal contrast opacification (180 HU within the pulmonary trunk) and extensive respiratory motion artifact throughout the lungs most pronounced towards the  lung bases. There is visible hypodense filling defect extending from the distal right inter lobar pulmonary artery into the right lower lobar and superior segmental pulmonary arteries in the in the right lower lobe (12/108). Some additional left pulmonary arterial filling defect is seen extending from the distal left main pulmonary artery into the lobar and segmental branches of the left upper lobe (12/91-80). Possible filling defect in the inferior lingular segmental artery (12/133). Central pulmonary arteries are normal caliber. No elevation of the RV/LV ratio (0.7). The heart is borderline enlarged. Three-vessel coronary artery disease is noted. Mediastinum/Nodes: No mediastinal fluid or gas. Normal thyroid gland and thoracic inlet. No acute abnormality of the trachea or esophagus. Scattered subcentimeter low-attenuation mediastinal and hilar adenopathy. Additional  borderline enlarged 11 mm left hilar node. Lungs/Pleura: There are patchy multifocal areas of mixed consolidation and ground-glass within the lungs. However, there is a region suggestive of an atoll sign in the inferior lingula corresponding to the distribution of 1 of the suspected pulmonary artery emboli which could reflect a region of wedge pulmonary infarct. Hypoventilatory changes are noted as well likely accentuated by imaging during exhalation. No pneumothorax or effusion. Upper Abdomen: No acute abnormalities present in the visualized portions of the upper abdomen. Musculoskeletal: Multilevel degenerative changes are present in the imaged portions of the spine. No acute osseous abnormality or suspicious osseous lesion. Review of the MIP images confirms the above findings. IMPRESSION: 1. Evaluation of the pulmonary arteries is limited by suboptimal contrast opacification and extensive respiratory motion artifact throughout the lungs most pronounced towards the lung bases. Pulmonary artery emboli extend from the right intralobar pulmonary artery into the lower lobar and superior segmental pulmonary arteries. Additional filling defects in the distal left main pulmonary artery extending into the left upper lobar and apical segmental branches as well as within the inferior lingular segmental branch. 2. No evidence of right heart strain. 3. Patchy multifocal areas of mixed consolidation and ground-glass within the lungs, compatible with pneumonia given recent COVID-19 positivity. 4. Peripheral consolidation with central ground-glass compatible with an atoll sign in the inferior lingula in the distribution of a pulmonary embolus could reflect a developing pulmonary infarct. 5. Scattered low-attenuation subcentimeter nodes with a borderline enlarged AP window lymph node, likely reactive. 6. Three-vessel coronary artery disease. 7. Aortic Atherosclerosis (ICD10-I70.0). Critical Value/emergent results were called by  telephone at the time of interpretation on 06/27/2020 at 9:00 pm to provider JULIE HAVILAND , who verbally acknowledged these results. Electronically Signed   By: Kreg Shropshire M.D.   On: 06/27/2020 21:00   DG Chest Portable 1 View  Result Date: 06/27/2020 CLINICAL DATA:  History of COVID-19 positivity recently with increasing shortness of breath EXAM: PORTABLE CHEST 1 VIEW COMPARISON:  06/19/2020 FINDINGS: Cardiac shadow is stable. The lungs are well aerated bilaterally. Patchy opacity is noted in the bases bilaterally worse on the left than the right consistent with multifocal pneumonia. This may be sequelae from the prior COVID-19 infection. No bony abnormality is noted. IMPRESSION: Bibasilar opacities left greater than right consistent with multifocal pneumonia. This is likely sequelae from the patient's prior COVID-19 positivity. Electronically Signed   By: Alcide Clever M.D.   On: 06/27/2020 12:15    EKG: Independently reviewed. Sinus tachycardia  Assessment/Plan Principal Problem:   Bilateral pulmonary embolism (HCC) Active Problems:   Nicotine dependence   Acute hypoxemic respiratory failure due to COVID-19 [facility])   Type 2 diabetes mellitus without complication (HCC)   Respiratory failure (HCC)   Pulmonary  embolism (HCC)  # Acute bilateral pulmonary embolism - provoked, almost certainly 2/2 very recent covid diagnosis. CT shows extensive b/l disease, no heart strain. No signs of DVT on exam. Hypoxic to upper 80s on arrival, improved to normal with 3 L [place]. CT shows signs covid pneumonia. Hemodynamically stable. - continue heparin, likely transition to orals tomorrow - f/u lower extremity dopplers - [place] O2 - f/u bnp  # COVID pneumonia - s/p remdesivir and several days decadron. Current symptoms likely driven by b/l PE. - resume decadron qd - holding on antibiotics for now - f/u inflammatory markers - f/u urine/blood cultures - covid precautions and order set used  # T2DM - here glucose  elevated to 200s, recent a1c wnl. Elevation likely 2/2 recent steroids - lantus 20 for home 70/30; start SSI - hold home actos, glipizide  # COPD - appears stable - duoneb prn  DVT prophylaxis: heparin Code Status: full  Family Communication: daughter updated telephonically  Disposition Plan: tbd  Consults called: none  Admission status: med/surg    Silvano Bilis MD [facility] Pager [phone]  If 7PM-7AM, please contact night-coverage [email or web address] Password [facility]  06/27/2020, 9:47 PM

## 2020-06-27 NOTE — ED Notes (Signed)
Pt removed from 02 per Dr. Ceasar Lund order.  Pt tolerating well.

## 2020-06-28 ENCOUNTER — Inpatient Hospital Stay (HOSPITAL_COMMUNITY): Payer: HRSA Program

## 2020-06-28 DIAGNOSIS — J9601 Acute respiratory failure with hypoxia: Secondary | ICD-10-CM

## 2020-06-28 DIAGNOSIS — IMO0002 Reserved for concepts with insufficient information to code with codable children: Secondary | ICD-10-CM | POA: Diagnosis present

## 2020-06-28 DIAGNOSIS — E669 Obesity, unspecified: Secondary | ICD-10-CM | POA: Diagnosis present

## 2020-06-28 DIAGNOSIS — E1165 Type 2 diabetes mellitus with hyperglycemia: Secondary | ICD-10-CM

## 2020-06-28 DIAGNOSIS — U071 COVID-19: Principal | ICD-10-CM | POA: Diagnosis present

## 2020-06-28 DIAGNOSIS — J189 Pneumonia, unspecified organism: Secondary | ICD-10-CM

## 2020-06-28 DIAGNOSIS — E118 Type 2 diabetes mellitus with unspecified complications: Secondary | ICD-10-CM

## 2020-06-28 DIAGNOSIS — Z720 Tobacco use: Secondary | ICD-10-CM | POA: Diagnosis present

## 2020-06-28 DIAGNOSIS — I2601 Septic pulmonary embolism with acute cor pulmonale: Secondary | ICD-10-CM

## 2020-06-28 DIAGNOSIS — J1282 Pneumonia due to coronavirus disease 2019: Secondary | ICD-10-CM | POA: Diagnosis present

## 2020-06-28 DIAGNOSIS — R0609 Other forms of dyspnea: Secondary | ICD-10-CM

## 2020-06-28 DIAGNOSIS — I2699 Other pulmonary embolism without acute cor pulmonale: Secondary | ICD-10-CM

## 2020-06-28 DIAGNOSIS — I82461 Acute embolism and thrombosis of right calf muscular vein: Secondary | ICD-10-CM | POA: Diagnosis present

## 2020-06-28 LAB — ECHOCARDIOGRAM COMPLETE
Area-P 1/2: 2.04 cm2
Height: 66 in
S' Lateral: 2.6 cm
Weight: 3680 oz

## 2020-06-28 LAB — BASIC METABOLIC PANEL
Anion gap: 10 (ref 5–15)
BUN: 9 mg/dL (ref 6–20)
CO2: 22 mmol/L (ref 22–32)
Calcium: 8.3 mg/dL — ABNORMAL LOW (ref 8.9–10.3)
Chloride: 102 mmol/L (ref 98–111)
Creatinine, Ser: 0.73 mg/dL (ref 0.44–1.00)
GFR calc Af Amer: >60 mL/min (ref >60)
GFR calc non Af Amer: >60 mL/min (ref >60)
Glucose, Bld: 216 mg/dL — ABNORMAL HIGH (ref 70–99)
Potassium: 4.2 mmol/L (ref 3.5–5.1)
Sodium: 134 mmol/L — ABNORMAL LOW (ref 135–145)

## 2020-06-28 LAB — CBG MONITORING, ED
Glucose-Capillary: 194 mg/dL — ABNORMAL HIGH (ref 70–99)
Glucose-Capillary: 256 mg/dL — ABNORMAL HIGH (ref 70–99)
Glucose-Capillary: 343 mg/dL — ABNORMAL HIGH (ref 70–99)

## 2020-06-28 LAB — CBC
HCT: 42.7 % (ref 36.0–46.0)
Hemoglobin: 13.7 g/dL (ref 12.0–15.0)
MCH: 30.2 pg (ref 26.0–34.0)
MCHC: 32.1 g/dL (ref 30.0–36.0)
MCV: 94.3 fL (ref 80.0–100.0)
Platelets: 275 10*3/uL (ref 150–400)
RBC: 4.53 MIL/uL (ref 3.87–5.11)
RDW: 13.6 % (ref 11.5–15.5)
WBC: 17.6 10*3/uL — ABNORMAL HIGH (ref 4.0–10.5)
nRBC: 0 % (ref 0.0–0.2)

## 2020-06-28 LAB — C-REACTIVE PROTEIN
CRP: 12.9 mg/dL — ABNORMAL HIGH (ref <1.0)
CRP: 11.7 mg/dL — ABNORMAL HIGH (ref <1.0)

## 2020-06-28 LAB — HEPARIN LEVEL (UNFRACTIONATED)
Heparin Unfractionated: 0.54 IU/mL (ref 0.30–0.70)
Heparin Unfractionated: 0.55 IU/mL (ref 0.30–0.70)

## 2020-06-28 LAB — D-DIMER, QUANTITATIVE: D-Dimer, Quant: 1.52 ug/mL-FEU — ABNORMAL HIGH (ref 0.00–0.50)

## 2020-06-28 LAB — FERRITIN: Ferritin: 782 ng/mL — ABNORMAL HIGH (ref 11–307)

## 2020-06-28 LAB — LACTATE DEHYDROGENASE: LDH: 202 U/L — ABNORMAL HIGH (ref 98–192)

## 2020-06-28 LAB — BRAIN NATRIURETIC PEPTIDE: B Natriuretic Peptide: 221.1 pg/mL — ABNORMAL HIGH (ref 0.0–100.0)

## 2020-06-28 LAB — APTT: aPTT: >200 seconds (ref 24–36)

## 2020-06-28 LAB — LACTIC ACID, PLASMA: Lactic Acid, Venous: 1.5 mmol/L (ref 0.5–1.9)

## 2020-06-28 LAB — HEPATITIS PANEL, ACUTE
HCV Ab: NONREACTIVE
Hep A IgM: NONREACTIVE
Hep B C IgM: NONREACTIVE
Hepatitis B Surface Ag: NONREACTIVE

## 2020-06-28 LAB — TROPONIN I (HIGH SENSITIVITY): Troponin I (High Sensitivity): 11 ng/L (ref <18)

## 2020-06-28 MED ORDER — SODIUM CHLORIDE 0.9 % IV SOLN
100.0000 mg | Freq: Every day | INTRAVENOUS | Status: AC
  Administered 2020-06-29 – 2020-07-02 (×4): 100 mg via INTRAVENOUS
  Filled 2020-06-28 (×5): qty 20

## 2020-06-28 MED ORDER — ZINC SULFATE 220 (50 ZN) MG PO CAPS
220.0000 mg | ORAL_CAPSULE | Freq: Every day | ORAL | Status: DC
  Administered 2020-06-28 – 2020-07-02 (×5): 220 mg via ORAL
  Filled 2020-06-28 (×5): qty 1

## 2020-06-28 MED ORDER — METHYLPREDNISOLONE SODIUM SUCC 125 MG IJ SOLR
60.0000 mg | Freq: Two times a day (BID) | INTRAMUSCULAR | Status: DC
  Administered 2020-06-28 – 2020-07-02 (×7): 60 mg via INTRAVENOUS
  Filled 2020-06-28 (×7): qty 2

## 2020-06-28 MED ORDER — ASCORBIC ACID 500 MG PO TABS
500.0000 mg | ORAL_TABLET | Freq: Every day | ORAL | Status: DC
  Administered 2020-06-28 – 2020-07-02 (×5): 500 mg via ORAL
  Filled 2020-06-28 (×5): qty 1

## 2020-06-28 MED ORDER — SODIUM CHLORIDE 0.9 % IV SOLN
200.0000 mg | Freq: Once | INTRAVENOUS | Status: AC
  Administered 2020-06-28: 200 mg via INTRAVENOUS
  Filled 2020-06-28: qty 40

## 2020-06-28 MED ORDER — IPRATROPIUM-ALBUTEROL 20-100 MCG/ACT IN AERS
1.0000 | INHALATION_SPRAY | Freq: Four times a day (QID) | RESPIRATORY_TRACT | Status: DC
  Administered 2020-06-28 – 2020-06-30 (×8): 1 via RESPIRATORY_TRACT
  Filled 2020-06-28 (×2): qty 4

## 2020-06-28 NOTE — Progress Notes (Signed)
ANTICOAGULATION CONSULT NOTE - Initial Consult  Pharmacy Consult for heparin Indication: pulmonary embolus  Allergies  Allergen Reactions  . Sulfa Antibiotics Shortness Of Breath and Swelling    All-over swelling  . Penicillins Rash    Patient Measurements: Height: 5\' 6"  (167.6 cm) Weight: 104.3 kg (230 lb) IBW/kg (Calculated) : 59.3 Heparin Dosing Weight: 83.2 kg  Vital Signs: Temp: 97.9 F (36.6 C) (08/07 0900) Temp Source: Oral (08/07 0900) BP: 119/67 (08/07 0900) Pulse Rate: 82 (08/07 0900)  Labs: Recent Labs    06/27/20 1550 06/27/20 2325 06/28/20 0739 06/28/20 0744  HGB 15.4*  --   --  13.7  HCT 48.4*  --   --  42.7  PLT 281  --   --  275  APTT  --  >200*  --   --   LABPROT  --  18.4*  --   --   INR  --  1.6*  --   --   HEPARINUNFRC  --   --  0.54  --   CREATININE 1.08*  --   --  0.73  TROPONINIHS  --  11  --   --     Estimated Creatinine Clearance (by C-G formula based on SCr of 0.73 mg/dL) Female: 08/28/20 mL/min Female: 113.9 mL/min   Medical History: Past Medical History:  Diagnosis Date  . COPD (chronic obstructive pulmonary disease) (HCC)   . Diabetes mellitus without complication (HCC)   . Kidney stone     Medications:  Scheduled:  . dexamethasone  6 mg Oral Q24H  . insulin aspart  0-15 Units Subcutaneous TID WC  . insulin glargine  29 Units Subcutaneous QHS  . Ipratropium-Albuterol  1 puff Inhalation Q6H  . sodium chloride flush  3 mL Intravenous Q12H   Infusions:  . sodium chloride    . heparin 1,400 Units/hr (06/28/20 0710)   PRN:   Assessment: 59 yo female presents with increased shortness of breath and generalized weakness. A CT was done and the patient was found to have a pulmonary embolism without right heart strain. PTA the patient was not on any anticoagulants. The patient's CBC is WNL and there are no signs and symptoms of bleeding. Pharmacy is now consulted to start IV heparin for pulmonary embolism.  Initial heparin level  therapeutic this AM, no issues with infusion, CBC remains stable.  Goal of Therapy:  Heparin level 0.3-0.7 units/ml Monitor platelets by anticoagulation protocol: Yes   Plan:  Continue heparin gtt at 1400 units/hr F/u 6 hour heparin level to confirm F/u long term [facility] plan  [facility], PharmD Clinical Pharmacist ED Pharmacist Phone # [phone] 06/28/2020 11:04 AM

## 2020-06-28 NOTE — Progress Notes (Signed)
ANTICOAGULATION CONSULT NOTE   Pharmacy Consult for heparin Indication: pulmonary embolus/VTE  Allergies  Allergen Reactions  . Sulfa Antibiotics Shortness Of Breath and Swelling    All-over swelling  . Penicillins Rash    Patient Measurements: Height: 5\' 6"  (167.6 cm) Weight: 104.3 kg (230 lb) IBW/kg (Calculated) : 59.3 Heparin Dosing Weight: 83.2 kg  Vital Signs: Temp: 97.9 F (36.6 C) (08/07 0900) Temp Source: Oral (08/07 0900) BP: 119/67 (08/07 0900) Pulse Rate: 82 (08/07 0900)  Labs: Recent Labs    06/27/20 1550 06/27/20 2325 06/28/20 0739 06/28/20 0744 06/28/20 1453  HGB 15.4*  --   --  13.7  --   HCT 48.4*  --   --  42.7  --   PLT 281  --   --  275  --   APTT  --  >200*  --   --   --   LABPROT  --  18.4*  --   --   --   INR  --  1.6*  --   --   --   HEPARINUNFRC  --   --  0.54  --  0.55  CREATININE 1.08*  --   --  0.73  --   TROPONINIHS  --  11  --   --   --     Estimated Creatinine Clearance (by C-G formula based on SCr of 0.73 mg/dL) Female: 08/28/20 mL/min Female: 113.9 mL/min   Medical History: Past Medical History:  Diagnosis Date  . COPD (chronic obstructive pulmonary disease) (HCC)   . Diabetes mellitus without complication (HCC)   . Kidney stone     Medications:  Scheduled:  . vitamin C  500 mg Oral Daily  . insulin aspart  0-15 Units Subcutaneous TID WC  . insulin glargine  29 Units Subcutaneous QHS  . Ipratropium-Albuterol  1 puff Inhalation Q6H  . methylPREDNISolone (SOLU-MEDROL) injection  60 mg Intravenous Q12H  . sodium chloride flush  3 mL Intravenous Q12H  . zinc sulfate  220 mg Oral Daily   Infusions:  . sodium chloride    . heparin 1,400 Units/hr (06/28/20 0710)  . [START ON 06/29/2020] remdesivir 100 mg in NS 100 mL     PRN:   Assessment: 59 yo female presents with increased shortness of breath and generalized weakness. A CT was done and the patient was found to have a pulmonary embolism without right heart strain. PTA the  patient was not on any anticoagulants. The patient's CBC is WNL and there are no signs and symptoms of bleeding. Pharmacy is now consulted to start IV heparin for pulmonary embolism.  Repeat heparin level remains therapeutic at 0.55. CBC stable, no overt bleeding documented.   Goal of Therapy:  Heparin level 0.3-0.7 units/ml Monitor platelets by anticoagulation protocol: Yes   Plan:  Continue heparin gtt at 1400 units/hr F/u daily HL, CBC, and signs of bleeding F/u long term [facility] plan  [facility], PharmD PGY1 Acute Care Pharmacy Resident  06/28/2020 4:27 PM  Please check [email or web address] for unit-specific pharmacy phone numbers.

## 2020-06-28 NOTE — Progress Notes (Signed)
PROGRESS NOTE    Delma OfficerDiane R Dazey  ZOX:[id number] DOB: [birth year] DOA: 06/27/2020 PCP: Patient, No Pcp Per     Brief Narrative:  59 y.o. WF PMHx DM Type II uncontrolled with complication, COPD,, Tobacco Abuse and recent covid hospitalization, who presents with the above.  Covid positive 7/26, had symptoms for a couple days prior. Presented to this hospital 7/30 with dyspnea. Found to be hypoxic with covid pneumonia. Treated with o2, steroids, and remdesivir. Completed course of remdisivir. Discharged 8/2. Notes had steady improvement in breathing symptoms since start of covid treatment. However for past 2 days has had worsening shortness of breath. Occasional cough. Some tightness in chest when takes deep breath. No fevers. No n/v/d. No leg swelling. No hx of dvt/PE. Was not vaccinated.  ED Course: labs, imaging, heparin, antibiotics   Subjective: A/O x4, positive S OB, negative abdominal pain, negative nausea, negative vomiting.  States mother has had multiple PE/DVT.  States does not use oxygen at home or CPAP.   Assessment & Plan: Covid vaccination; no vaccination   Principal Problem:   Bilateral pulmonary embolism (HCC) Active Problems:   Nicotine dependence   Acute respiratory failure with hypoxia (HCC)   Acute hypoxemic respiratory failure due to COVID-19 [facility](HCC)   Type 2 diabetes mellitus without complication (HCC)   Respiratory failure (HCC)   Pulmonary embolism (HCC)   Obesity (BMI 30-39.9)   Acute deep vein thrombosis (DVT) of calf muscle vein of right lower extremity (HCC)   Pneumonia due to COVID-19 virus   Tobacco abuse   Diabetes mellitus type 2, uncontrolled, with complications (HCC)  Acute respiratory failure with hypoxia/Covid pneumonia COVID-19 Labs -Just discharged on 8/2 for exact same issue; breakthrough  Vs relapse, given how recently patient discharged for relapse. -Covid inflammatory markers pending -flutter valve -Incentive spirometer -Respimat BID  QID -New onset O2 requirement, CRP>7 -8/7 Remdesivir -8/7 Solu-Medrol 60 mg BID -Vitamin C 500 mg daily -Zinc 220 mg daily  COVID-19 Labs  Recent Labs    06/27/20 2325 06/28/20 0744 06/28/20 1217  DDIMER 1.52*  --   --   FERRITIN  --   --  782*  LDH  --   --  202*  CRP  --  12.9* 11.7*    Lab Results  Component Value Date   SARSCOV2NAA POSITIVE (A) 06/27/2020   SARSCOV2NAA POSITIVE (A) 06/16/2020     COPD/Tobacco abuse -Continues to smoke one PPD -Patient counseled on absolute need to discontinue smoking.  Acute bilateral PE/ACUTE RIGHT DVT -Most likely secondary to her Covid infection.  Tobacco abuse also exacerbating factor. -8/7 echocardiogram pending -8/7 lower extremity Doppler; multivein DVT RIGHT, see Doppler below -Continue heparin drip -8/7 consult NCM does patient have insurance?  What would be the best anticoagulant covered by her insurance? -Patient with mother who had multiple PEs/DVTs; ordered anticardiolipin antibody, lupus anticoagulant panel.  Remainder of hypercoagulability panel will have to wait until patient is off all of heparin, and acute phase is passed..  DM type II uncontrolled with complication -7/30 hemoglobin A1c= 7.3 -We will hold patient's home medication glipizide, Actos. -Continue Novolin 70/30 15 units BID (hold); home med -Lantus 29 units daily -Moderate SSI -8/7 consult to diabetic coordinator uncontrolled diabetic who is obese requires some counseling. -8/7 consult to nutritionist. uncontrolled diabetic who is obese requires some counseling  Obesity BMI 30-39.9 -see diabetes   DVT prophylaxis: Heparin drip Code Status: Full Family Communication:  Status is: Inpatient    Dispo: The patient is from:  Home              Anticipated d/c is to: Home              Anticipated d/c date is: 8/17              Patient currently unstable      Consultants:    Procedures/Significant Events:  8/7 CTA chest PE  protocol;-pulmonary artery emboli extend from the right intralobar pulmonary artery into the lower lobar and superior segmental pulmonaryarteries.  -Additional filling defects in the distal left main pulmonary artery extending into the left upper lobar and apical segmental branches as well as within the inferior lingular segmental branch. -No evidence of right heart strain. -. Patchy multifocal areas of mixed consolidation and ground-glass within the lungs, compatible with pneumonia given recent COVID-19 positivity. -Peripheral consolidation with central ground-glass compatible with an atoll sign in the inferior lingula in the distribution of a pulmonary embolus could reflect a developing pulmonary infarct. -Scattered low-attenuation subcentimeter nodes with a borderline enlarged AP window lymph node, likely reactive.6. Three-vessel coronary artery disease. 8/7 bilateral lower extremity Doppler;RIGHT: Acute DVT-RIGHT popliteal vein, right posterior tibial veins, right peroneal veins, and right gastrocnemius veins.  LEFT: Negative DVT  I have personally reviewed and interpreted all radiology studies and my findings are as above.  VENTILATOR SETTINGS  Nasal cannula 8/7 Flow; 2 L/min SPO2; 93%   Cultures   Antimicrobials: Anti-infectives (From admission, onward)   Start     Ordered Stop   06/29/20 1000  remdesivir 100 mg in sodium chloride 0.9 % 100 mL IVPB     Discontinue    "Followed by" Linked Group Details   06/28/20 1214 07/03/20 0959   06/28/20 1330  remdesivir 200 mg in sodium chloride 0.9% 250 mL IVPB       "Followed by" Linked Group Details   06/28/20 1214 06/28/20 1522   06/27/20 2115  cefTRIAXone (ROCEPHIN) 1 g in sodium chloride 0.9 % 100 mL IVPB        06/27/20 2101 06/27/20 2307   06/27/20 2115  azithromycin (ZITHROMAX) 500 mg in sodium chloride 0.9 % 250 mL IVPB  Status:  Discontinued        06/27/20 2101 06/27/20 2323       Devices    LINES / TUBES:       Continuous Infusions: . sodium chloride    . heparin 1,400 Units/hr (06/28/20 0710)  . [START ON 06/29/2020] remdesivir 100 mg in NS 100 mL       Objective: Vitals:   06/28/20 0445 06/28/20 0711 06/28/20 0840 06/28/20 0900  BP: (!) 178/93  123/71 119/67  Pulse: 78 74 87 82  Resp: (!) 21 18 19  (!) 23  Temp:    97.9 F (36.6 C)  TempSrc:    Oral  SpO2: 96% 94% 96% 94%  Weight:      Height:       No intake or output data in the 24 hours ending 06/28/20 1543 Filed Weights   06/27/20 1153  Weight: 104.3 kg    Examination:  General: A/O x4, positive acute respiratory distress Eyes: negative scleral hemorrhage, negative anisocoria, negative icterus ENT: Negative Runny nose, negative gingival bleeding, Neck:  Negative scars, masses, torticollis, lymphadenopathy, JVD Lungs: Clear to auscultation bilaterally without wheezes or crackles Cardiovascular: Regular rate and rhythm without murmur gallop or rub normal S1 and S2 Abdomen: negative abdominal pain, nondistended, positive soft, bowel sounds, no rebound, no ascites, no appreciable  mass Extremities: No significant cyanosis, clubbing, or edema bilateral lower extremities Skin: Negative rashes, lesions, ulcers Psychiatric:  Negative depression, negative anxiety, negative fatigue, negative mania  Central nervous system:  Cranial nerves II through XII intact, tongue/uvula midline, all extremities muscle strength 5/5, sensation intact throughout,negative dysarthria, negative expressive aphasia, negative receptive aphasia.  .     Data Reviewed: Care during the described time interval was provided by me .  I have reviewed this patient's available data, including medical history, events of note, physical examination, and all test results as part of my evaluation.  CBC: Recent Labs  Lab 06/22/20 0859 06/23/20 0354 06/27/20 1550 06/28/20 0744  WBC 10.1 12.3* 20.0* 17.6*  NEUTROABS 8.4* 10.7* 17.0*  --   HGB 14.3 15.1*  15.4* 13.7  HCT 44.0 45.7 48.4* 42.7  MCV 92.4 92.7 95.5 94.3  PLT 283 315 281 275   Basic Metabolic Panel: Recent Labs  Lab 06/22/20 0859 06/23/20 0354 06/27/20 1550 06/28/20 0744  NA 139 139 133* 134*  K 3.9 3.8 4.1 4.2  CL 105 105 98 102  CO2 24 24 21* 22  GLUCOSE 321* 221* 247* 216*  BUN 22* 24* 12 9  CREATININE 0.90 0.81 1.08* 0.73  CALCIUM 8.3* 8.3* 8.3* 8.3*   GFR: Estimated Creatinine Clearance (by C-G formula based on SCr of 0.73 mg/dL) Female: 03.5 mL/min Female: 113.9 mL/min Liver Function Tests: Recent Labs  Lab 06/22/20 0859 06/23/20 0354 06/27/20 1550  AST 14* 12* 12*  ALT 26 25 14   ALKPHOS 87 91 112  BILITOT 0.8 0.6 1.3*  PROT 6.4* 6.3* 7.1  ALBUMIN 2.7* 2.7* 2.5*   No results for input(s): LIPASE, AMYLASE in the last 168 hours. No results for input(s): AMMONIA in the last 168 hours. Coagulation Profile: Recent Labs  Lab 06/27/20 2325  INR 1.6*   Cardiac Enzymes: No results for input(s): CKTOTAL, CKMB, CKMBINDEX, TROPONINI in the last 168 hours. BNP (last 3 results) No results for input(s): PROBNP in the last 8760 hours. HbA1C: No results for input(s): HGBA1C in the last 72 hours. CBG: Recent Labs  Lab 06/22/20 2126 06/23/20 0744 06/23/20 1158 06/28/20 0739 06/28/20 1308  GLUCAP 199* 255* 210* 194* 256*   Lipid Profile: No results for input(s): CHOL, HDL, LDLCALC, TRIG, CHOLHDL, LDLDIRECT in the last 72 hours. Thyroid Function Tests: No results for input(s): TSH, T4TOTAL, FREET4, T3FREE, THYROIDAB in the last 72 hours. Anemia Panel: Recent Labs    06/28/20 1217  FERRITIN 782*   Sepsis Labs: Recent Labs  Lab 06/27/20 1550 06/27/20 2325  LATICACIDVEN 1.9 1.5    Recent Results (from the past 240 hour(s))  Blood Culture (routine x 2)     Status: None   Collection Time: 06/19/20  3:38 PM   Specimen: Right Antecubital; Blood  Result Value Ref Range Status   Specimen Description   Final    RIGHT ANTECUBITAL Performed at [facility], [address] [place], [place]    Special Requests   Final    BOTTLES DRAWN AEROBIC AND ANAEROBIC Blood Culture adequate volume Performed at [facility], [address], [place], [place]    Culture   Final    NO GROWTH 5 DAYS Performed at [facility], [address], [place], [place]    Report Status 06/24/2020 FINAL  Final  Blood Culture (routine x 2)     Status: None   Collection Time: 06/19/20  3:42  PM   Specimen: BLOOD RIGHT FOREARM  Result Value Ref Range Status   Specimen Description   Final    BLOOD RIGHT FOREARM Performed at [facility], [address] [place], [place] [address]    Special Requests   Final    BOTTLES DRAWN AEROBIC AND ANAEROBIC Blood Culture adequate volume Performed at [facility], [address] [place], [place] [address]    Culture   Final    NO GROWTH 5 DAYS Performed at [facility], [address] [place], [place] [address]    Report Status 06/24/2020 FINAL  Final  Respiratory Panel by PCR     Status: None   Collection Time: 06/21/20  1:25 PM   Specimen: Nasopharyngeal Swab; Respiratory  Result Value Ref Range Status   Adenovirus NOT DETECTED NOT DETECTED Final   Coronavirus 229E NOT DETECTED NOT DETECTED Final    Comment: (NOTE) The Coronavirus on the Respiratory Panel, DOES NOT test for the novel  Coronavirus (2019 nCoV)    Coronavirus HKU1 NOT DETECTED NOT DETECTED Final   Coronavirus NL63 NOT DETECTED NOT DETECTED Final   Coronavirus OC43 NOT DETECTED NOT DETECTED Final   Metapneumovirus NOT DETECTED NOT DETECTED Final   Rhinovirus / Enterovirus NOT DETECTED NOT DETECTED Final   Influenza A NOT DETECTED NOT DETECTED Final   Influenza B NOT DETECTED NOT DETECTED Final   Parainfluenza Virus 1 NOT DETECTED NOT DETECTED Final   Parainfluenza Virus 2 NOT DETECTED NOT DETECTED Final   Parainfluenza Virus 3 NOT DETECTED NOT DETECTED Final    Parainfluenza Virus 4 NOT DETECTED NOT DETECTED Final   Respiratory Syncytial Virus NOT DETECTED NOT DETECTED Final   Bordetella pertussis NOT DETECTED NOT DETECTED Final   Chlamydophila pneumoniae NOT DETECTED NOT DETECTED Final   Mycoplasma pneumoniae NOT DETECTED NOT DETECTED Final    Comment: Performed at [facility], [address], [place], [place] [address]  Blood culture (routine x 2)     Status: None (Preliminary result)   Collection Time: 06/27/20  3:46 PM   Specimen: BLOOD RIGHT HAND  Result Value Ref Range Status   Specimen Description BLOOD RIGHT HAND  Final   Special Requests   Final    BOTTLES DRAWN AEROBIC ONLY Blood Culture results may not be optimal due to an inadequate volume of blood received in culture bottles   Culture   Final    NO GROWTH < 24 HOURS Performed at [facility], [address], [place], [place] [address]    Report Status PENDING  Incomplete  Blood culture (routine x 2)     Status: None (Preliminary result)   Collection Time: 06/27/20  3:50 PM   Specimen: BLOOD  Result Value Ref Range Status   Specimen Description BLOOD LEFT ANTECUBITAL  Final   Special Requests   Final    BOTTLES DRAWN AEROBIC AND ANAEROBIC Blood Culture results may not be optimal due to an inadequate volume of blood received in culture bottles   Culture   Final    NO GROWTH < 24 HOURS Performed at [facility], [address] [place], [place] [address]    Report Status PENDING  Incomplete  SARS Coronavirus 2 by RT PCR (hospital order, performed in [facility] hospital lab) Nasopharyngeal Nasopharyngeal Swab     Status: Abnormal   Collection Time: 06/27/20  6:01 PM   Specimen: Nasopharyngeal Swab  Result Value Ref Range Status  SARS Coronavirus 2 POSITIVE (A) NEGATIVE Final    Comment: RESULT CALLED TO, READ BACK BY AND VERIFIED WITH: Tally Due RN 06/27/20 1925 JDW (NOTE) SARS-CoV-2 target nucleic acids are DETECTED  SARS-CoV-2 RNA is generally  detectable in upper respiratory specimens  during the acute phase of infection.  Positive results are indicative  of the presence of the identified virus, but do not rule out bacterial infection or co-infection with other pathogens not detected by the test.  Clinical correlation with patient history and  other diagnostic information is necessary to determine patient infection status.  The expected result is negative.  Fact Sheet for Patients:   BoilerBrush.com.cy   Fact Sheet for Healthcare Providers:   https://pope.com/    This test is not yet approved or cleared by the [place] FDA and  has been authorized for detection and/or diagnosis of SARS-CoV-2 by FDA under an Emergency Use Authorization (EUA).  This EUA will remain in effect (meaning this test can b e used) for the duration of  the COVID-19 declaration under Section 564(b)(1) of the Act, 21 U.S.C. section 360-bbb-3(b)(1), unless the authorization is terminated or revoked sooner.  Performed at [facility], [address] [place], [place] [address]          Radiology Studies: CT Angio Chest PE W and/or Wo Contrast  Result Date: 06/27/2020 CLINICAL DATA:  PE suspected, recent COVID-19 positivity EXAM: CT ANGIOGRAPHY CHEST WITH CONTRAST TECHNIQUE: Multidetector CT imaging of the chest was performed using the standard protocol during bolus administration of intravenous contrast. Multiplanar CT image reconstructions and MIPs were obtained to evaluate the vascular anatomy. CONTRAST:  OMNIPAQUE IOHEXOL 350 MG/ML SOLN COMPARISON:  Radiograph 06/27/2020 FINDINGS: Cardiovascular: Evaluation of the pulmonary arteries is limited by suboptimal contrast opacification (180 HU within the pulmonary trunk) and extensive respiratory motion artifact throughout the lungs most pronounced towards the lung bases. There is visible hypodense filling defect extending from the distal  right inter lobar pulmonary artery into the right lower lobar and superior segmental pulmonary arteries in the in the right lower lobe (12/108). Some additional left pulmonary arterial filling defect is seen extending from the distal left main pulmonary artery into the lobar and segmental branches of the left upper lobe (12/91-80). Possible filling defect in the inferior lingular segmental artery (12/133). Central pulmonary arteries are normal caliber. No elevation of the RV/LV ratio (0.7). The heart is borderline enlarged. Three-vessel coronary artery disease is noted. Mediastinum/Nodes: No mediastinal fluid or gas. Normal thyroid gland and thoracic inlet. No acute abnormality of the trachea or esophagus. Scattered subcentimeter low-attenuation mediastinal and hilar adenopathy. Additional borderline enlarged 11 mm left hilar node. Lungs/Pleura: There are patchy multifocal areas of mixed consolidation and ground-glass within the lungs. However, there is a region suggestive of an atoll sign in the inferior lingula corresponding to the distribution of 1 of the suspected pulmonary artery emboli which could reflect a region of wedge pulmonary infarct. Hypoventilatory changes are noted as well likely accentuated by imaging during exhalation. No pneumothorax or effusion. Upper Abdomen: No acute abnormalities present in the visualized portions of the upper abdomen. Musculoskeletal: Multilevel degenerative changes are present in the imaged portions of the spine. No acute osseous abnormality or suspicious osseous lesion. Review of the MIP images confirms the above findings. IMPRESSION: 1. Evaluation of the pulmonary arteries is limited by suboptimal contrast opacification and extensive respiratory motion artifact throughout the lungs most pronounced towards the lung bases. Pulmonary artery emboli extend from the  right intralobar pulmonary artery into the lower lobar and superior segmental pulmonary arteries. Additional  filling defects in the distal left main pulmonary artery extending into the left upper lobar and apical segmental branches as well as within the inferior lingular segmental branch. 2. No evidence of right heart strain. 3. Patchy multifocal areas of mixed consolidation and ground-glass within the lungs, compatible with pneumonia given recent COVID-19 positivity. 4. Peripheral consolidation with central ground-glass compatible with an atoll sign in the inferior lingula in the distribution of a pulmonary embolus could reflect a developing pulmonary infarct. 5. Scattered low-attenuation subcentimeter nodes with a borderline enlarged AP window lymph node, likely reactive. 6. Three-vessel coronary artery disease. 7. Aortic Atherosclerosis (ICD10-I70.0). Critical Value/emergent results were called by telephone at the time of interpretation on 06/27/2020 at 9:00 pm to provider JULIE HAVILAND , who verbally acknowledged these results. Electronically Signed   By: Kreg Shropshire M.D.   On: 06/27/2020 21:00   DG Chest Portable 1 View  Result Date: 06/27/2020 CLINICAL DATA:  History of COVID-19 positivity recently with increasing shortness of breath EXAM: PORTABLE CHEST 1 VIEW COMPARISON:  06/19/2020 FINDINGS: Cardiac shadow is stable. The lungs are well aerated bilaterally. Patchy opacity is noted in the bases bilaterally worse on the left than the right consistent with multifocal pneumonia. This may be sequelae from the prior COVID-19 infection. No bony abnormality is noted. IMPRESSION: Bibasilar opacities left greater than right consistent with multifocal pneumonia. This is likely sequelae from the patient's prior COVID-19 positivity. Electronically Signed   By: Alcide Clever M.D.   On: 06/27/2020 12:15   VAS [place] LOWER EXTREMITY VENOUS (DVT)  Result Date: 06/28/2020  Lower Venous DVTStudy Indications: Covid-19, and pulmonary embolism.  Comparison Study: No prior study on file Performing Technologist: Sherren Kerns RVS   Examination Guidelines: A complete evaluation includes B-mode imaging, spectral Doppler, color Doppler, and power Doppler as needed of all accessible portions of each vessel. Bilateral testing is considered an integral part of a complete examination. Limited examinations for reoccurring indications may be performed as noted. The reflux portion of the exam is performed with the patient in reverse Trendelenburg.  +---------+---------------+---------+-----------+----------+-----------------+ RIGHT    CompressibilityPhasicitySpontaneityPropertiesThrombus Aging    +---------+---------------+---------+-----------+----------+-----------------+ CFV      Full           Yes      Yes                                    +---------+---------------+---------+-----------+----------+-----------------+ SFJ      Full                                                           +---------+---------------+---------+-----------+----------+-----------------+ FV Prox  Full                                                           +---------+---------------+---------+-----------+----------+-----------------+ FV Mid   Full                                                           +---------+---------------+---------+-----------+----------+-----------------+  FV DistalFull                                                           +---------+---------------+---------+-----------+----------+-----------------+ PFV      Full                                                           +---------+---------------+---------+-----------+----------+-----------------+ POP      Full           No       No                   Age Indeterminate +---------+---------------+---------+-----------+----------+-----------------+ PTV      Full                                         Age Indeterminate +---------+---------------+---------+-----------+----------+-----------------+ PERO     Full                                          Age Indeterminate +---------+---------------+---------+-----------+----------+-----------------+ Gastroc  Full           No       No                   Age Indeterminate +---------+---------------+---------+-----------+----------+-----------------+ popliteal, gastroc, posterior tibial, and peroneal veins all compress but are dilated and do not have color or Doppler flow present.  +---------+---------------+---------+-----------+----------+--------------+ LEFT     CompressibilityPhasicitySpontaneityPropertiesThrombus Aging +---------+---------------+---------+-----------+----------+--------------+ CFV      Full           Yes                                          +---------+---------------+---------+-----------+----------+--------------+ SFJ      Full                                                        +---------+---------------+---------+-----------+----------+--------------+ FV Prox  Full                                                        +---------+---------------+---------+-----------+----------+--------------+ FV Mid   Full                                                        +---------+---------------+---------+-----------+----------+--------------+ FV DistalFull                                                        +---------+---------------+---------+-----------+----------+--------------+  PFV      Full                                                        +---------+---------------+---------+-----------+----------+--------------+ POP      Full           No       No                                  +---------+---------------+---------+-----------+----------+--------------+ PTV      Full                                                        +---------+---------------+---------+-----------+----------+--------------+ PERO     Full                                                         +---------+---------------+---------+-----------+----------+--------------+ Gastroc  Full                                                        +---------+---------------+---------+-----------+----------+--------------+    Summary: RIGHT: - Findings consistent with age indeterminate deep vein thrombosis involving the right popliteal vein, right posterior tibial veins, right peroneal veins, and right gastrocnemius veins.  LEFT: - There is no evidence of deep vein thrombosis in the lower extremity.  *See table(s) above for measurements and observations.    Preliminary         Scheduled Meds: . vitamin C  500 mg Oral Daily  . insulin aspart  0-15 Units Subcutaneous TID WC  . insulin glargine  29 Units Subcutaneous QHS  . Ipratropium-Albuterol  1 puff Inhalation Q6H  . methylPREDNISolone (SOLU-MEDROL) injection  60 mg Intravenous Q12H  . sodium chloride flush  3 mL Intravenous Q12H  . zinc sulfate  220 mg Oral Daily   Continuous Infusions: . sodium chloride    . heparin 1,400 Units/hr (06/28/20 0710)  . [START ON 06/29/2020] remdesivir 100 mg in NS 100 mL       LOS: 1 day    Time spent:40 min    Saiquan Hands, Roselind Messier, MD [facility] Pager [phone]  If 7PM-7AM, please contact night-coverage [email or web address] Password [facility] 06/28/2020, 3:43 PM

## 2020-06-28 NOTE — ED Notes (Signed)
Breakfast tray given. °

## 2020-06-28 NOTE — ED Notes (Signed)
Date and time results received: 06/28/20 0020 (use smartphrase ".now" to insert current time)  Test: APTT Critical Value: >200  Name of Provider Notified: Rathore   Orders Received? Or Actions Taken?: waiting for response

## 2020-06-28 NOTE — Progress Notes (Signed)
VASCULAR LAB    Bilateral lower extremity venous duplex completed.    Preliminary report:  See CV proc for preliminary results.  Messaged Dr. Joseph Art with results.  Aleeah Greeno, RVT 06/28/2020, 1:46 PM

## 2020-06-28 NOTE — Progress Notes (Signed)
*  PRELIMINARY RESULTS* Echocardiogram 2D Echocardiogram has been performed.  Stacey Drain 06/28/2020, 4:36 PM

## 2020-06-29 DIAGNOSIS — I5031 Acute diastolic (congestive) heart failure: Secondary | ICD-10-CM | POA: Diagnosis present

## 2020-06-29 LAB — CBC WITH DIFFERENTIAL/PLATELET
Abs Immature Granulocytes: 0.17 10*3/uL — ABNORMAL HIGH (ref 0.00–0.07)
Basophils Absolute: 0 10*3/uL (ref 0.0–0.1)
Basophils Relative: 0 %
Eosinophils Absolute: 0 10*3/uL (ref 0.0–0.5)
Eosinophils Relative: 0 %
HCT: 40.3 % (ref 36.0–46.0)
Hemoglobin: 13.3 g/dL (ref 12.0–15.0)
Immature Granulocytes: 1 %
Lymphocytes Relative: 5 %
Lymphs Abs: 1.1 10*3/uL (ref 0.7–4.0)
MCH: 31 pg (ref 26.0–34.0)
MCHC: 33 g/dL (ref 30.0–36.0)
MCV: 93.9 fL (ref 80.0–100.0)
Monocytes Absolute: 0.8 10*3/uL (ref 0.1–1.0)
Monocytes Relative: 4 %
Neutro Abs: 19.2 10*3/uL — ABNORMAL HIGH (ref 1.7–7.7)
Neutrophils Relative %: 90 %
Platelets: 301 10*3/uL (ref 150–400)
RBC: 4.29 MIL/uL (ref 3.87–5.11)
RDW: 13.4 % (ref 11.5–15.5)
WBC: 21.2 10*3/uL — ABNORMAL HIGH (ref 4.0–10.5)
nRBC: 0 % (ref 0.0–0.2)

## 2020-06-29 LAB — GLUCOSE, CAPILLARY
Glucose-Capillary: 320 mg/dL — ABNORMAL HIGH (ref 70–99)
Glucose-Capillary: 314 mg/dL — ABNORMAL HIGH (ref 70–99)
Glucose-Capillary: 410 mg/dL — ABNORMAL HIGH (ref 70–99)
Glucose-Capillary: 255 mg/dL — ABNORMAL HIGH (ref 70–99)

## 2020-06-29 LAB — COMPREHENSIVE METABOLIC PANEL
ALT: 19 U/L (ref 0–44)
AST: 18 U/L (ref 15–41)
Albumin: 2 g/dL — ABNORMAL LOW (ref 3.5–5.0)
Alkaline Phosphatase: 82 U/L (ref 38–126)
Anion gap: 12 (ref 5–15)
BUN: 25 mg/dL — ABNORMAL HIGH (ref 6–20)
CO2: 20 mmol/L — ABNORMAL LOW (ref 22–32)
Calcium: 8.4 mg/dL — ABNORMAL LOW (ref 8.9–10.3)
Chloride: 102 mmol/L (ref 98–111)
Creatinine, Ser: 0.86 mg/dL (ref 0.44–1.00)
GFR calc Af Amer: >60 mL/min (ref >60)
GFR calc non Af Amer: >60 mL/min (ref >60)
Glucose, Bld: 385 mg/dL — ABNORMAL HIGH (ref 70–99)
Potassium: 4 mmol/L (ref 3.5–5.1)
Sodium: 134 mmol/L — ABNORMAL LOW (ref 135–145)
Total Bilirubin: 0.4 mg/dL (ref 0.3–1.2)
Total Protein: 6 g/dL — ABNORMAL LOW (ref 6.5–8.1)

## 2020-06-29 LAB — PHOSPHORUS: Phosphorus: 2.2 mg/dL — ABNORMAL LOW (ref 2.5–4.6)

## 2020-06-29 LAB — FERRITIN: Ferritin: 1001 ng/mL — ABNORMAL HIGH (ref 11–307)

## 2020-06-29 LAB — CARDIOLIPIN ANTIBODIES, IGG, IGM, IGA
Anticardiolipin IgA: <9 APL U/mL (ref 0–11)
Anticardiolipin IgG: <9 GPL U/mL (ref 0–14)
Anticardiolipin IgM: 16 MPL U/mL — ABNORMAL HIGH (ref 0–12)

## 2020-06-29 LAB — HEPARIN LEVEL (UNFRACTIONATED): Heparin Unfractionated: 0.48 IU/mL (ref 0.30–0.70)

## 2020-06-29 LAB — LACTATE DEHYDROGENASE: LDH: 216 U/L — ABNORMAL HIGH (ref 98–192)

## 2020-06-29 LAB — C-REACTIVE PROTEIN: CRP: 5.4 mg/dL — ABNORMAL HIGH (ref <1.0)

## 2020-06-29 LAB — MAGNESIUM: Magnesium: 2 mg/dL (ref 1.7–2.4)

## 2020-06-29 MED ORDER — INSULIN GLARGINE 100 UNIT/ML ~~LOC~~ SOLN
20.0000 [IU] | Freq: Two times a day (BID) | SUBCUTANEOUS | Status: DC
  Administered 2020-06-29: 20 [IU] via SUBCUTANEOUS
  Filled 2020-06-29 (×3): qty 0.2

## 2020-06-29 MED ORDER — INSULIN ASPART 100 UNIT/ML ~~LOC~~ SOLN
0.0000 [IU] | Freq: Three times a day (TID) | SUBCUTANEOUS | Status: DC
  Administered 2020-06-30: 11 [IU] via SUBCUTANEOUS
  Administered 2020-06-30 (×2): 20 [IU] via SUBCUTANEOUS
  Administered 2020-07-01: 3 [IU] via SUBCUTANEOUS
  Administered 2020-07-01: 4 [IU] via SUBCUTANEOUS
  Administered 2020-07-01 – 2020-07-02 (×2): 7 [IU] via SUBCUTANEOUS
  Administered 2020-07-02: 4 [IU] via SUBCUTANEOUS

## 2020-06-29 MED ORDER — INSULIN ASPART 100 UNIT/ML ~~LOC~~ SOLN
10.0000 [IU] | Freq: Once | SUBCUTANEOUS | Status: AC
  Administered 2020-06-29: 10 [IU] via SUBCUTANEOUS

## 2020-06-29 NOTE — Progress Notes (Signed)
ANTICOAGULATION CONSULT NOTE   Pharmacy Consult for heparin Indication: pulmonary embolus/VTE  Allergies  Allergen Reactions  . Sulfa Antibiotics Shortness Of Breath and Swelling    All-over swelling  . Penicillins Rash    Patient Measurements: Height: 5\' 6"  (167.6 cm) Weight: 97.9 kg (215 lb 14.4 oz) IBW/kg (Calculated) : 59.3 Heparin Dosing Weight: 83.2 kg  Vital Signs: Temp: 98.7 F (37.1 C) (08/08 1616) Temp Source: Axillary (08/08 1616) BP: 99/86 (08/08 1616) Pulse Rate: 76 (08/08 1616)  Labs: Recent Labs    06/27/20 1550 06/27/20 1550 06/27/20 2325 06/28/20 0739 06/28/20 0744 06/28/20 1453 06/29/20 1726  HGB 15.4*   < >  --   --  13.7  --  13.3  HCT 48.4*  --   --   --  42.7  --  40.3  PLT 281  --   --   --  275  --  301  APTT  --   --  >200*  --   --   --   --   LABPROT  --   --  18.4*  --   --   --   --   INR  --   --  1.6*  --   --   --   --   HEPARINUNFRC  --   --   --  0.54  --  0.55 0.48  CREATININE 1.08*  --   --   --  0.73  --  0.86  TROPONINIHS  --   --  11  --   --   --   --    < > = values in this interval not displayed.    Estimated Creatinine Clearance (by C-G formula based on SCr of 0.86 mg/dL) Female: 08/29/20 mL/min Female: 102.5 mL/min   Medical History: Past Medical History:  Diagnosis Date  . COPD (chronic obstructive pulmonary disease) (HCC)   . Diabetes mellitus without complication (HCC)   . Kidney stone     Medications:  Scheduled:  . vitamin C  500 mg Oral Daily  . [START ON 06/30/2020] insulin aspart  0-20 Units Subcutaneous TID WC  . insulin glargine  20 Units Subcutaneous BID  . Ipratropium-Albuterol  1 puff Inhalation Q6H  . methylPREDNISolone (SOLU-MEDROL) injection  60 mg Intravenous Q12H  . sodium chloride flush  3 mL Intravenous Q12H  . zinc sulfate  220 mg Oral Daily   Infusions:  . sodium chloride    . heparin 1,400 Units/hr (06/29/20 0655)  . remdesivir 100 mg in NS 100 mL 100 mg (06/29/20 1007)   PRN:    Assessment: 59 yo female presents with increased shortness of breath and generalized weakness. A CT was done and the patient was found to have a pulmonary embolism without right heart strain. PTA the patient was not on any anticoagulants. The patient's CBC is WNL and there are no signs and symptoms of bleeding. Pharmacy is now consulted to start IV heparin for pulmonary embolism.  Heparin level remains therapeutic at 0.48. CBC stable, no overt bleeding documented.   Goal of Therapy:  Heparin level 0.3-0.7 units/ml Monitor platelets by anticoagulation protocol: Yes   Plan:  Continue heparin gtt at 1400 units/hr F/u daily HL, CBC, and signs of bleeding F/u long term AC plan  41, [facility] Clinical Pharmacist  06/29/2020 8:08 PM   [facility] pharmacy phone numbers are listed on [email or web address]

## 2020-06-29 NOTE — Progress Notes (Signed)
PROGRESS NOTE    Jill Barnes  JWJ:[id number] DOB: [birth year] DOA: 06/27/2020 PCP: Patient, No Pcp Per     Brief Narrative:  59 y.o. WF PMHx DM Type II uncontrolled with complication, COPD,, Tobacco Abuse and recent covid hospitalization, who presents with the above.  Covid positive 7/26, had symptoms for a couple days prior. Presented to this hospital 7/30 with dyspnea. Found to be hypoxic with covid pneumonia. Treated with o2, steroids, and remdesivir. Completed course of remdisivir. Discharged 8/2. Notes had steady improvement in breathing symptoms since start of covid treatment. However for past 2 days has had worsening shortness of breath. Occasional cough. Some tightness in chest when takes deep breath. No fevers. No n/v/d. No leg swelling. No hx of dvt/PE. Was not vaccinated.  ED Course: labs, imaging, heparin, antibiotics   Subjective: 8/8 febrile overnight, positive S OB, positive left upper chest pain deep inspiration, negative abdominal pain.   Assessment & Plan: Covid vaccination; no vaccination   Principal Problem:   Bilateral pulmonary embolism (HCC) Active Problems:   Nicotine dependence   Acute respiratory failure with hypoxia (HCC)   Acute hypoxemic respiratory failure due to COVID-19 [facility])   Type 2 diabetes mellitus without complication (HCC)   Respiratory failure (HCC)   Pulmonary embolism (HCC)   Obesity (BMI 30-39.9)   Acute deep vein thrombosis (DVT) of calf muscle vein of right lower extremity (HCC)   Pneumonia due to COVID-19 virus   Tobacco abuse   Diabetes mellitus type 2, uncontrolled, with complications (HCC)   Acute diastolic CHF (congestive heart failure) (HCC)  Acute respiratory failure with hypoxia/Covid pneumonia COVID-19 Labs -Just discharged on 8/2 for exact same issue; breakthrough  Vs relapse, given how recently patient discharged for relapse. -Covid inflammatory markers pending -flutter valve -Incentive spirometer -Respimat BID  QID -New onset O2 requirement, CRP>7 -8/7 Remdesivir -8/7 Solu-Medrol 60 mg BID -Vitamin C 500 mg daily -Zinc 220 mg daily  COVID-19 Labs  Recent Labs    06/27/20 2325 06/28/20 0744 06/28/20 1217  DDIMER 1.52*  --   --   FERRITIN  --   --  782*  LDH  --   --  202*  CRP  --  12.9* 11.7*    Lab Results  Component Value Date   SARSCOV2NAA POSITIVE (A) 06/27/2020   SARSCOV2NAA POSITIVE (A) 06/16/2020     COPD/Tobacco abuse -Continues to smoke one PPD -Patient counseled on absolute need to discontinue smoking.  Acute bilateral PE/ACUTE RIGHT DVT -Most likely secondary to her Covid infection.  Tobacco abuse also exacerbating factor. -8/7 echocardiogram pending -8/7 lower extremity Doppler; multivein DVT RIGHT, see Doppler below -Continue heparin drip -8/7 consult NCM does patient have insurance?  What would be the best anticoagulant covered by her insurance? -Patient with mother who had multiple PEs/DVTs; ordered anticardiolipin antibody, lupus anticoagulant panel.  Remainder of hypercoagulability panel will have to wait until patient is off all of heparin, and acute phase is passed.. -On 8/9 will have a conference with NCM/LCSW patient has zero insurance will need to determine how we are going to obtain DOAC for patient  Acute diastolic CHF -Strict in and out -Daily weight  DM type II uncontrolled with complication -7/30 hemoglobin A1c= 7.3 -We will hold patient's home medication glipizide, Actos. -Novolin 70/30 15 units BID (hold); home med -8/8 increase Lantus 20 units BID -Moderate SSI -8/7 consult to diabetic coordinator uncontrolled diabetic who is obese requires some counseling. -8/7 consult to nutritionist. uncontrolled diabetic who is obese  requires some counseling  Obesity BMI 30-39.9 -see diabetes   DVT prophylaxis: Heparin drip Code Status: Full Family Communication:  Status is: Inpatient    Dispo: The patient is from: Home               Anticipated d/c is to: Home              Anticipated d/c date is: 8/17              Patient currently unstable      Consultants:    Procedures/Significant Events:  8/7 CTA chest PE protocol;-pulmonary artery emboli extend from the right intralobar pulmonary artery into the lower lobar and superior segmental pulmonaryarteries.  -Additional filling defects in the distal left main pulmonary artery extending into the left upper lobar and apical segmental branches as well as within the inferior lingular segmental branch. -No evidence of right heart strain. -. Patchy multifocal areas of mixed consolidation and ground-glass within the lungs, compatible with pneumonia given recent COVID-19 positivity. -Peripheral consolidation with central ground-glass compatible with an atoll sign in the inferior lingula in the distribution of a pulmonary embolus could reflect a developing pulmonary infarct. -Scattered low-attenuation subcentimeter nodes with a borderline enlarged AP window lymph node, likely reactive.6. Three-vessel coronary artery disease. 8/7 bilateral lower extremity Doppler;RIGHT: Acute DVT-RIGHT popliteal vein, right posterior tibial veins, right peroneal veins, and right gastrocnemius veins.  LEFT: Negative DVT 8/7 Echocardiogram;Left Ventricle: LVEF=60 to 65%.  -Grade I diastolic dysfunction (impaired relaxation).     I have personally reviewed and interpreted all radiology studies and my findings are as above.  VENTILATOR SETTINGS  Nasal cannula 8/8 Flow; 2 L/min SPO2; 92%   Cultures   Antimicrobials: Anti-infectives (From admission, onward)   Start     Ordered Stop   06/29/20 1000  remdesivir 100 mg in sodium chloride 0.9 % 100 mL IVPB     Discontinue    "Followed by" Linked Group Details   06/28/20 1214 07/03/20 0959   06/28/20 1330  remdesivir 200 mg in sodium chloride 0.9% 250 mL IVPB       "Followed by" Linked Group Details   06/28/20 1214 06/28/20 1522    06/27/20 2115  cefTRIAXone (ROCEPHIN) 1 g in sodium chloride 0.9 % 100 mL IVPB        06/27/20 2101 06/27/20 2307   06/27/20 2115  azithromycin (ZITHROMAX) 500 mg in sodium chloride 0.9 % 250 mL IVPB  Status:  Discontinued        06/27/20 2101 06/27/20 2323       Devices    LINES / TUBES:      Continuous Infusions:  sodium chloride     heparin 1,400 Units/hr (06/29/20 0655)   remdesivir 100 mg in NS 100 mL 100 mg (06/29/20 1007)     Objective: Vitals:   06/29/20 0608 06/29/20 0648 06/29/20 0757 06/29/20 1217  BP:  (!) 147/58 135/65 126/84  Pulse: 67   77  Resp:  20 20 20   Temp:  97.7 F (36.5 C) 97.6 F (36.4 C) 97.8 F (36.6 C)  TempSrc:  Oral Oral   SpO2: 94%   91%  Weight:  97.7 kg    Height:  5\' 6"  (1.676 m)      Intake/Output Summary (Last 24 hours) at 06/29/2020 1458 Last data filed at 06/29/2020 0852 Gross per 24 hour  Intake 240 ml  Output --  Net 240 ml   Filed Weights   06/27/20 1153 06/29/20 08/27/20  Weight: 104.3 kg 97.7 kg    Examination:  General: A/O x4, positive acute respiratory distress Eyes: negative scleral hemorrhage, negative anisocoria, negative icterus ENT: Negative Runny nose, negative gingival bleeding, Neck:  Negative scars, masses, torticollis, lymphadenopathy, JVD Lungs: Clear to auscultation bilaterally without wheezes or crackles Cardiovascular: Regular rate and rhythm without murmur gallop or rub normal S1 and S2 Abdomen: negative abdominal pain, nondistended, positive soft, bowel sounds, no rebound, no ascites, no appreciable mass Extremities: No significant cyanosis, clubbing, or edema bilateral lower extremities Skin: Negative rashes, lesions, ulcers Psychiatric:  Negative depression, negative anxiety, negative fatigue, negative mania  Central nervous system:  Cranial nerves II through XII intact, tongue/uvula midline, all extremities muscle strength 5/5, sensation intact throughout,negative dysarthria, negative expressive  aphasia, negative receptive aphasia.  .     Data Reviewed: Care during the described time interval was provided by me .  I have reviewed this patient's available data, including medical history, events of note, physical examination, and all test results as part of my evaluation.  CBC: Recent Labs  Lab 06/23/20 0354 06/27/20 1550 06/28/20 0744  WBC 12.3* 20.0* 17.6*  NEUTROABS 10.7* 17.0*  --   HGB 15.1* 15.4* 13.7  HCT 45.7 48.4* 42.7  MCV 92.7 95.5 94.3  PLT 315 281 275   Basic Metabolic Panel: Recent Labs  Lab 06/23/20 0354 06/27/20 1550 06/28/20 0744  NA 139 133* 134*  K 3.8 4.1 4.2  CL 105 98 102  CO2 24 21* 22  GLUCOSE 221* 247* 216*  BUN 24* 12 9  CREATININE 0.81 1.08* 0.73  CALCIUM 8.3* 8.3* 8.3*   GFR: Estimated Creatinine Clearance (by C-G formula based on SCr of 0.73 mg/dL) Female: 26.3 mL/min Female: 110.2 mL/min Liver Function Tests: Recent Labs  Lab 06/23/20 0354 06/27/20 1550  AST 12* 12*  ALT 25 14  ALKPHOS 91 112  BILITOT 0.6 1.3*  PROT 6.3* 7.1  ALBUMIN 2.7* 2.5*   No results for input(s): LIPASE, AMYLASE in the last 168 hours. No results for input(s): AMMONIA in the last 168 hours. Coagulation Profile: Recent Labs  Lab 06/27/20 2325  INR 1.6*   Cardiac Enzymes: No results for input(s): CKTOTAL, CKMB, CKMBINDEX, TROPONINI in the last 168 hours. BNP (last 3 results) No results for input(s): PROBNP in the last 8760 hours. HbA1C: No results for input(s): HGBA1C in the last 72 hours. CBG: Recent Labs  Lab 06/28/20 0739 06/28/20 1308 06/28/20 1805 06/29/20 0806 06/29/20 1207  GLUCAP 194* 256* 343* 320* 314*   Lipid Profile: No results for input(s): CHOL, HDL, LDLCALC, TRIG, CHOLHDL, LDLDIRECT in the last 72 hours. Thyroid Function Tests: No results for input(s): TSH, T4TOTAL, FREET4, T3FREE, THYROIDAB in the last 72 hours. Anemia Panel: Recent Labs    06/28/20 1217  FERRITIN 782*   Sepsis Labs: Recent Labs  Lab  06/27/20 1550 06/27/20 2325  LATICACIDVEN 1.9 1.5    Recent Results (from the past 240 hour(s))  Blood Culture (routine x 2)     Status: None   Collection Time: 06/19/20  3:38 PM   Specimen: Right Antecubital; Blood  Result Value Ref Range Status   Specimen Description   Final    RIGHT ANTECUBITAL Performed at [facility], [address] [place], [place] [address]    Special Requests   Final    BOTTLES DRAWN AEROBIC AND ANAEROBIC Blood Culture adequate volume Performed at [facility], [address] [place], [place] [address]  Culture   Final    NO GROWTH 5 DAYS Performed at [facility], [address], [place], [place] [address]    Report Status 06/24/2020 FINAL  Final  Blood Culture (routine x 2)     Status: None   Collection Time: 06/19/20  3:42 PM   Specimen: BLOOD RIGHT FOREARM  Result Value Ref Range Status   Specimen Description   Final    BLOOD RIGHT FOREARM Performed at [facility], [address] [place], [place] [address]    Special Requests   Final    BOTTLES DRAWN AEROBIC AND ANAEROBIC Blood Culture adequate volume Performed at [facility], [address] [place], [place] [address]    Culture   Final    NO GROWTH 5 DAYS Performed at [facility], [address], [place], [place] [address]    Report Status 06/24/2020 FINAL  Final  Respiratory Panel by PCR     Status: None   Collection Time: 06/21/20  1:25 PM   Specimen: Nasopharyngeal Swab; Respiratory  Result Value Ref Range Status   Adenovirus NOT DETECTED NOT DETECTED Final   Coronavirus 229E NOT DETECTED NOT DETECTED Final    Comment: (NOTE) The Coronavirus on the Respiratory Panel, DOES NOT test for the novel  Coronavirus (2019 nCoV)    Coronavirus HKU1 NOT DETECTED NOT DETECTED Final   Coronavirus NL63 NOT DETECTED NOT DETECTED Final   Coronavirus OC43 NOT DETECTED NOT DETECTED Final   Metapneumovirus NOT DETECTED NOT DETECTED  Final   Rhinovirus / Enterovirus NOT DETECTED NOT DETECTED Final   Influenza A NOT DETECTED NOT DETECTED Final   Influenza B NOT DETECTED NOT DETECTED Final   Parainfluenza Virus 1 NOT DETECTED NOT DETECTED Final   Parainfluenza Virus 2 NOT DETECTED NOT DETECTED Final   Parainfluenza Virus 3 NOT DETECTED NOT DETECTED Final   Parainfluenza Virus 4 NOT DETECTED NOT DETECTED Final   Respiratory Syncytial Virus NOT DETECTED NOT DETECTED Final   Bordetella pertussis NOT DETECTED NOT DETECTED Final   Chlamydophila pneumoniae NOT DETECTED NOT DETECTED Final   Mycoplasma pneumoniae NOT DETECTED NOT DETECTED Final    Comment: Performed at [facility], [address], [place], [place] [address]  Blood culture (routine x 2)     Status: None (Preliminary result)   Collection Time: 06/27/20  3:46 PM   Specimen: BLOOD RIGHT HAND  Result Value Ref Range Status   Specimen Description BLOOD RIGHT HAND  Final   Special Requests   Final    BOTTLES DRAWN AEROBIC ONLY Blood Culture results may not be optimal due to an inadequate volume of blood received in culture bottles   Culture   Final    NO GROWTH 2 DAYS Performed at [facility], [address] [place], [place] [address]    Report Status PENDING  Incomplete  Blood culture (routine x 2)     Status: None (Preliminary result)   Collection Time: 06/27/20  3:50 PM   Specimen: BLOOD  Result Value Ref Range Status   Specimen Description BLOOD LEFT ANTECUBITAL  Final   Special Requests   Final    BOTTLES DRAWN AEROBIC AND ANAEROBIC Blood Culture results may not be optimal due to an inadequate volume of blood received in culture bottles   Culture   Final    NO GROWTH 2 DAYS Performed at [facility], [address] [place], [place] [address]  Report Status PENDING  Incomplete  SARS Coronavirus 2 by RT PCR (hospital order, performed in [facility] hospital lab) Nasopharyngeal Nasopharyngeal Swab     Status: Abnormal   Collection  Time: 06/27/20  6:01 PM   Specimen: Nasopharyngeal Swab  Result Value Ref Range Status   SARS Coronavirus 2 POSITIVE (A) NEGATIVE Final    Comment: RESULT CALLED TO, READ BACK BY AND VERIFIED WITH: Tally Due RN 06/27/20 1925 JDW (NOTE) SARS-CoV-2 target nucleic acids are DETECTED  SARS-CoV-2 RNA is generally detectable in upper respiratory specimens  during the acute phase of infection.  Positive results are indicative  of the presence of the identified virus, but do not rule out bacterial infection or co-infection with other pathogens not detected by the test.  Clinical correlation with patient history and  other diagnostic information is necessary to determine patient infection status.  The expected result is negative.  Fact Sheet for Patients:   BoilerBrush.com.cy   Fact Sheet for Healthcare Providers:   https://pope.com/    This test is not yet approved or cleared by the [place] FDA and  has been authorized for detection and/or diagnosis of SARS-CoV-2 by FDA under an Emergency Use Authorization (EUA).  This EUA will remain in effect (meaning this test can b e used) for the duration of  the COVID-19 declaration under Section 564(b)(1) of the Act, 21 U.S.C. section 360-bbb-3(b)(1), unless the authorization is terminated or revoked sooner.  Performed at [facility], [address] [place], [place] [address]          Radiology Studies: CT Angio Chest PE W and/or Wo Contrast  Result Date: 06/27/2020 CLINICAL DATA:  PE suspected, recent COVID-19 positivity EXAM: CT ANGIOGRAPHY CHEST WITH CONTRAST TECHNIQUE: Multidetector CT imaging of the chest was performed using the standard protocol during bolus administration of intravenous contrast. Multiplanar CT image reconstructions and MIPs were obtained to evaluate the vascular anatomy. CONTRAST:  OMNIPAQUE IOHEXOL 350 MG/ML SOLN COMPARISON:  Radiograph 06/27/2020  FINDINGS: Cardiovascular: Evaluation of the pulmonary arteries is limited by suboptimal contrast opacification (180 HU within the pulmonary trunk) and extensive respiratory motion artifact throughout the lungs most pronounced towards the lung bases. There is visible hypodense filling defect extending from the distal right inter lobar pulmonary artery into the right lower lobar and superior segmental pulmonary arteries in the in the right lower lobe (12/108). Some additional left pulmonary arterial filling defect is seen extending from the distal left main pulmonary artery into the lobar and segmental branches of the left upper lobe (12/91-80). Possible filling defect in the inferior lingular segmental artery (12/133). Central pulmonary arteries are normal caliber. No elevation of the RV/LV ratio (0.7). The heart is borderline enlarged. Three-vessel coronary artery disease is noted. Mediastinum/Nodes: No mediastinal fluid or gas. Normal thyroid gland and thoracic inlet. No acute abnormality of the trachea or esophagus. Scattered subcentimeter low-attenuation mediastinal and hilar adenopathy. Additional borderline enlarged 11 mm left hilar node. Lungs/Pleura: There are patchy multifocal areas of mixed consolidation and ground-glass within the lungs. However, there is a region suggestive of an atoll sign in the inferior lingula corresponding to the distribution of 1 of the suspected pulmonary artery emboli which could reflect a region of wedge pulmonary infarct. Hypoventilatory changes are noted as well likely accentuated by imaging during exhalation. No pneumothorax or effusion. Upper Abdomen: No acute abnormalities present in the visualized portions of the upper abdomen. Musculoskeletal: Multilevel degenerative changes are present in the imaged portions of the spine.  No acute osseous abnormality or suspicious osseous lesion. Review of the MIP images confirms the above findings. IMPRESSION: 1. Evaluation of the  pulmonary arteries is limited by suboptimal contrast opacification and extensive respiratory motion artifact throughout the lungs most pronounced towards the lung bases. Pulmonary artery emboli extend from the right intralobar pulmonary artery into the lower lobar and superior segmental pulmonary arteries. Additional filling defects in the distal left main pulmonary artery extending into the left upper lobar and apical segmental branches as well as within the inferior lingular segmental branch. 2. No evidence of right heart strain. 3. Patchy multifocal areas of mixed consolidation and ground-glass within the lungs, compatible with pneumonia given recent COVID-19 positivity. 4. Peripheral consolidation with central ground-glass compatible with an atoll sign in the inferior lingula in the distribution of a pulmonary embolus could reflect a developing pulmonary infarct. 5. Scattered low-attenuation subcentimeter nodes with a borderline enlarged AP window lymph node, likely reactive. 6. Three-vessel coronary artery disease. 7. Aortic Atherosclerosis (ICD10-I70.0). Critical Value/emergent results were called by telephone at the time of interpretation on 06/27/2020 at 9:00 pm to provider JULIE HAVILAND , who verbally acknowledged these results. Electronically Signed   By: Kreg Shropshire M.D.   On: 06/27/2020 21:00   ECHOCARDIOGRAM COMPLETE  Result Date: 06/28/2020    ECHOCARDIOGRAM REPORT   Patient Name:   FELICITAS SINE Date of Exam: 06/28/2020 Medical Rec #:  [id number]        Height:       66.0 in Accession #:    [id number]       Weight:       230.0 lb Date of Birth:  26-Jul-1961        BSA:          2.122 m Patient Age:    59 years         BP:           119/67 mmHg Patient Gender: F                HR:           82 bpm. Exam Location:  Inpatient Procedure: 2D Echo, Cardiac Doppler and Color Doppler Indications:    Pulmonary Embolus 415.19 / I26.99  History:        Patient has prior history of Echocardiogram examinations,  most                 recent 12/16/2017. COPD; Risk Factors:Dyslipidemia. Tobacco                 abuse,Acute hypoxemic respiratory failure due to COVID-19,Acute                 deep vein thrombosis (DVT) of calf muscle vein of right lower                 extremity (HCC).  Sonographer:    Celesta Gentile RCS Referring Phys: [id number] Aryella Besecker J Brinlynn Gorton IMPRESSIONS  1. Left ventricular ejection fraction, by estimation, is 60 to 65%. The left ventricle has normal function. The left ventricle has no regional wall motion abnormalities. There is mild concentric left ventricular hypertrophy. Left ventricular diastolic parameters are consistent with Grade I diastolic dysfunction (impaired relaxation). Elevated left ventricular end-diastolic pressure.  2. Right ventricular systolic function is normal. The right ventricular size is normal. There is normal pulmonary artery systolic pressure.  3. The mitral valve is normal in structure. Trivial mitral valve regurgitation. No evidence of mitral stenosis.  4.  The aortic valve has an indeterminant number of cusps. Aortic valve regurgitation is not visualized. Mild aortic valve sclerosis is present, with no evidence of aortic valve stenosis.  5. The inferior vena cava is normal in size with greater than 50% respiratory variability, suggesting right atrial pressure of 3 mmHg. FINDINGS  Left Ventricle: Left ventricular ejection fraction, by estimation, is 60 to 65%. The left ventricle has normal function. The left ventricle has no regional wall motion abnormalities. The left ventricular internal cavity size was normal in size. There is  mild concentric left ventricular hypertrophy. Left ventricular diastolic parameters are consistent with Grade I diastolic dysfunction (impaired relaxation). Elevated left ventricular end-diastolic pressure. Right Ventricle: The right ventricular size is normal. No increase in right ventricular wall thickness. Right ventricular systolic function is normal.  There is normal pulmonary artery systolic pressure. The tricuspid regurgitant velocity is 2.13 m/s, and  with an assumed right atrial pressure of 3 mmHg, the estimated right ventricular systolic pressure is 21.1 mmHg. Left Atrium: Left atrial size was normal in size. Right Atrium: Right atrial size was normal in size. Pericardium: There is no evidence of pericardial effusion. Mitral Valve: The mitral valve is normal in structure. Normal mobility of the mitral valve leaflets. Trivial mitral valve regurgitation. No evidence of mitral valve stenosis. Tricuspid Valve: The tricuspid valve is normal in structure. Tricuspid valve regurgitation is trivial. No evidence of tricuspid stenosis. Aortic Valve: The aortic valve has an indeterminant number of cusps. Aortic valve regurgitation is not visualized. Mild aortic valve sclerosis is present, with no evidence of aortic valve stenosis. Pulmonic Valve: The pulmonic valve was normal in structure. Pulmonic valve regurgitation is not visualized. No evidence of pulmonic stenosis. Aorta: The aortic root is normal in size and structure. Venous: The inferior vena cava is normal in size with greater than 50% respiratory variability, suggesting right atrial pressure of 3 mmHg. IAS/Shunts: The interatrial septum appears to be lipomatous. No atrial level shunt detected by color flow Doppler.  LEFT VENTRICLE PLAX 2D LVIDd:         3.90 cm  Diastology LVIDs:         2.60 cm  LV e' lateral:   5.55 cm/s LV PW:         1.10 cm  LV E/e' lateral: 19.3 LV IVS:        1.20 cm  LV e' medial:    7.51 cm/s LVOT diam:     1.60 cm  LV E/e' medial:  14.2 LV SV:         56 LV SV Index:   26 LVOT Area:     2.01 cm  RIGHT VENTRICLE RV S prime:     15.70 cm/s TAPSE (M-mode): 2.2 cm LEFT ATRIUM             Index       RIGHT ATRIUM           Index LA diam:        2.60 cm 1.23 cm/m  RA Area:     11.40 cm LA Vol (A2C):   50.7 ml 23.87 ml/m RA Volume:   24.30 ml  11.45 ml/m LA Vol (A4C):   58.9 ml 27.75  ml/m LA Biplane Vol: 56.2 ml 26.48 ml/m  AORTIC VALVE LVOT Vmax:   105.00 cm/s LVOT Vmean:  66.200 cm/s LVOT VTI:    0.277 m  AORTA Ao Root diam: 3.10 cm MITRAL VALVE  TRICUSPID VALVE MV Area (PHT): 2.04 cm     TR Peak grad:   18.1 mmHg MV Decel Time: 371 msec     TR Vmax:        213.00 cm/s MV E velocity: 107.00 cm/s MV A velocity: 149.00 cm/s  SHUNTS MV E/A ratio:  0.72         Systemic VTI:  0.28 m                             Systemic Diam: 1.60 cm Armanda Magic MD Electronically signed by Armanda Magic MD Signature Date/Time: 06/28/2020/4:49:07 PM    Final    VAS [place] LOWER EXTREMITY VENOUS (DVT)  Result Date: 06/28/2020  Lower Venous DVTStudy Indications: Covid-19, and pulmonary embolism.  Comparison Study: No prior study on file Performing Technologist: Sherren Kerns RVS  Examination Guidelines: A complete evaluation includes B-mode imaging, spectral Doppler, color Doppler, and power Doppler as needed of all accessible portions of each vessel. Bilateral testing is considered an integral part of a complete examination. Limited examinations for reoccurring indications may be performed as noted. The reflux portion of the exam is performed with the patient in reverse Trendelenburg.  +---------+---------------+---------+-----------+----------+-----------------+  RIGHT     Compressibility Phasicity Spontaneity Properties Thrombus Aging     +---------+---------------+---------+-----------+----------+-----------------+  CFV       Full            Yes       Yes                                       +---------+---------------+---------+-----------+----------+-----------------+  SFJ       Full                                                                +---------+---------------+---------+-----------+----------+-----------------+  FV Prox   Full                                                                +---------+---------------+---------+-----------+----------+-----------------+  FV Mid    Full                                                                 +---------+---------------+---------+-----------+----------+-----------------+  FV Distal Full                                                                +---------+---------------+---------+-----------+----------+-----------------+  PFV       Full                                                                +---------+---------------+---------+-----------+----------+-----------------+  POP       Full            No        No                     Age Indeterminate  +---------+---------------+---------+-----------+----------+-----------------+  PTV       Full                                             Age Indeterminate  +---------+---------------+---------+-----------+----------+-----------------+  PERO      Full                                             Age Indeterminate  +---------+---------------+---------+-----------+----------+-----------------+  Gastroc   Full            No        No                     Age Indeterminate  +---------+---------------+---------+-----------+----------+-----------------+ popliteal, gastroc, posterior tibial, and peroneal veins all compress but are dilated and do not have color or Doppler flow present.  +---------+---------------+---------+-----------+----------+--------------+  LEFT      Compressibility Phasicity Spontaneity Properties Thrombus Aging  +---------+---------------+---------+-----------+----------+--------------+  CFV       Full            Yes                                              +---------+---------------+---------+-----------+----------+--------------+  SFJ       Full                                                             +---------+---------------+---------+-----------+----------+--------------+  FV Prox   Full                                                             +---------+---------------+---------+-----------+----------+--------------+  FV Mid    Full                                                              +---------+---------------+---------+-----------+----------+--------------+  FV Distal Full                                                             +---------+---------------+---------+-----------+----------+--------------+  PFV       Full                                                             +---------+---------------+---------+-----------+----------+--------------+  POP       Full            No        No                                     +---------+---------------+---------+-----------+----------+--------------+  PTV       Full                                                             +---------+---------------+---------+-----------+----------+--------------+  PERO      Full                                                             +---------+---------------+---------+-----------+----------+--------------+  Gastroc   Full                                                             +---------+---------------+---------+-----------+----------+--------------+    Summary: RIGHT: - Findings consistent with age indeterminate deep vein thrombosis involving the right popliteal vein, right posterior tibial veins, right peroneal veins, and right gastrocnemius veins.  LEFT: - There is no evidence of deep vein thrombosis in the lower extremity.  *See table(s) above for measurements and observations. Electronically signed by Coral Else MD on 06/28/2020 at 5:50:24 PM.    Final         Scheduled Meds:  vitamin C  500 mg Oral Daily   insulin aspart  0-15 Units Subcutaneous TID WC   insulin glargine  20 Units Subcutaneous BID   Ipratropium-Albuterol  1 puff Inhalation Q6H   methylPREDNISolone (SOLU-MEDROL) injection  60 mg Intravenous Q12H   sodium chloride flush  3 mL Intravenous Q12H   zinc sulfate  220 mg Oral Daily   Continuous Infusions:  sodium chloride     heparin 1,400 Units/hr (06/29/20 0655)   remdesivir 100 mg in NS 100 mL 100 mg (06/29/20 1007)      LOS: 2 days    Time spent:40 min    Andera Cranmer, Roselind Messier, MD [facility] Pager [phone]  If 7PM-7AM, please contact night-coverage [email or web address] Password [facility] 06/29/2020, 2:58 PM

## 2020-06-30 ENCOUNTER — Other Ambulatory Visit: Payer: Self-pay

## 2020-06-30 LAB — GLUCOSE, CAPILLARY
Glucose-Capillary: 401 mg/dL — ABNORMAL HIGH (ref 70–99)
Glucose-Capillary: 351 mg/dL — ABNORMAL HIGH (ref 70–99)
Glucose-Capillary: 414 mg/dL — ABNORMAL HIGH (ref 70–99)
Glucose-Capillary: 493 mg/dL — ABNORMAL HIGH (ref 70–99)
Glucose-Capillary: 282 mg/dL — ABNORMAL HIGH (ref 70–99)
Glucose-Capillary: 259 mg/dL — ABNORMAL HIGH (ref 70–99)

## 2020-06-30 LAB — CBC WITH DIFFERENTIAL/PLATELET
Abs Immature Granulocytes: 0.12 10*3/uL — ABNORMAL HIGH (ref 0.00–0.07)
Basophils Absolute: 0 10*3/uL (ref 0.0–0.1)
Basophils Relative: 0 %
Eosinophils Absolute: 0 10*3/uL (ref 0.0–0.5)
Eosinophils Relative: 0 %
HCT: 39.5 % (ref 36.0–46.0)
Hemoglobin: 12.9 g/dL (ref 12.0–15.0)
Immature Granulocytes: 1 %
Lymphocytes Relative: 6 %
Lymphs Abs: 1 10*3/uL (ref 0.7–4.0)
MCH: 30.1 pg (ref 26.0–34.0)
MCHC: 32.7 g/dL (ref 30.0–36.0)
MCV: 92.1 fL (ref 80.0–100.0)
Monocytes Absolute: 0.4 10*3/uL (ref 0.1–1.0)
Monocytes Relative: 2 %
Neutro Abs: 15.8 10*3/uL — ABNORMAL HIGH (ref 1.7–7.7)
Neutrophils Relative %: 91 %
Platelets: 262 10*3/uL (ref 150–400)
RBC: 4.29 MIL/uL (ref 3.87–5.11)
RDW: 13.5 % (ref 11.5–15.5)
WBC: 17.4 10*3/uL — ABNORMAL HIGH (ref 4.0–10.5)
nRBC: 0 % (ref 0.0–0.2)

## 2020-06-30 LAB — HEPARIN LEVEL (UNFRACTIONATED): Heparin Unfractionated: 0.55 IU/mL (ref 0.30–0.70)

## 2020-06-30 LAB — FERRITIN: Ferritin: 907 ng/mL — ABNORMAL HIGH (ref 11–307)

## 2020-06-30 LAB — COMPREHENSIVE METABOLIC PANEL
ALT: 19 U/L (ref 0–44)
AST: 16 U/L (ref 15–41)
Albumin: 2 g/dL — ABNORMAL LOW (ref 3.5–5.0)
Alkaline Phosphatase: 89 U/L (ref 38–126)
Anion gap: 10 (ref 5–15)
BUN: 27 mg/dL — ABNORMAL HIGH (ref 6–20)
CO2: 23 mmol/L (ref 22–32)
Calcium: 8.4 mg/dL — ABNORMAL LOW (ref 8.9–10.3)
Chloride: 104 mmol/L (ref 98–111)
Creatinine, Ser: 0.86 mg/dL (ref 0.44–1.00)
GFR calc Af Amer: >60 mL/min (ref >60)
GFR calc non Af Amer: >60 mL/min (ref >60)
Glucose, Bld: 323 mg/dL — ABNORMAL HIGH (ref 70–99)
Potassium: 4.2 mmol/L (ref 3.5–5.1)
Sodium: 137 mmol/L (ref 135–145)
Total Bilirubin: 0.4 mg/dL (ref 0.3–1.2)
Total Protein: 6.1 g/dL — ABNORMAL LOW (ref 6.5–8.1)

## 2020-06-30 LAB — LACTATE DEHYDROGENASE: LDH: 214 U/L — ABNORMAL HIGH (ref 98–192)

## 2020-06-30 LAB — PHOSPHORUS: Phosphorus: 3.2 mg/dL (ref 2.5–4.6)

## 2020-06-30 LAB — MAGNESIUM: Magnesium: 2.1 mg/dL (ref 1.7–2.4)

## 2020-06-30 LAB — C-REACTIVE PROTEIN: CRP: 4.3 mg/dL — ABNORMAL HIGH (ref <1.0)

## 2020-06-30 MED ORDER — INSULIN ASPART 100 UNIT/ML ~~LOC~~ SOLN
10.0000 [IU] | Freq: Three times a day (TID) | SUBCUTANEOUS | Status: DC
  Administered 2020-06-30 – 2020-07-01 (×6): 10 [IU] via SUBCUTANEOUS

## 2020-06-30 MED ORDER — ORAL CARE MOUTH RINSE
15.0000 mL | Freq: Two times a day (BID) | OROMUCOSAL | Status: DC
  Administered 2020-06-30 – 2020-07-02 (×4): 15 mL via OROMUCOSAL

## 2020-06-30 MED ORDER — RIVAROXABAN 20 MG PO TABS: 20.0000 mg | ORAL_TABLET | Freq: Every day | ORAL | Status: DC

## 2020-06-30 MED ORDER — INSULIN ASPART 100 UNIT/ML ~~LOC~~ SOLN
12.0000 [IU] | Freq: Once | SUBCUTANEOUS | Status: AC
  Administered 2020-06-30: 12 [IU] via SUBCUTANEOUS

## 2020-06-30 MED ORDER — RIVAROXABAN 15 MG PO TABS
15.0000 mg | ORAL_TABLET | Freq: Two times a day (BID) | ORAL | Status: DC
  Administered 2020-07-01 – 2020-07-02 (×3): 15 mg via ORAL
  Filled 2020-06-30 (×3): qty 1

## 2020-06-30 MED ORDER — INSULIN GLARGINE 100 UNIT/ML ~~LOC~~ SOLN
30.0000 [IU] | Freq: Two times a day (BID) | SUBCUTANEOUS | Status: DC
  Administered 2020-06-30 – 2020-07-02 (×5): 30 [IU] via SUBCUTANEOUS
  Filled 2020-06-30 (×6): qty 0.3

## 2020-06-30 NOTE — Progress Notes (Signed)
ANTICOAGULATION CONSULT NOTE   Pharmacy Consult for heparin>>Xarelto Indication: pulmonary embolus/VTE  Allergies  Allergen Reactions  . Sulfa Antibiotics Shortness Of Breath and Swelling    All-over swelling  . Penicillins Rash    Patient Measurements: Height: 5\' 6"  (167.6 cm) Weight: 97.8 kg (215 lb 9.6 oz) IBW/kg (Calculated) : 59.3 Heparin Dosing Weight: 83.2 kg  Vital Signs: Temp: 97.7 F (36.5 C) (08/09 1243) Temp Source: Axillary (08/09 1243) BP: 126/71 (08/09 1243) Pulse Rate: 64 (08/09 1243)  Labs: Recent Labs    06/27/20 2325 06/28/20 0739 06/28/20 0744 06/28/20 0744 06/28/20 1453 06/29/20 1726 06/30/20 0232  HGB  --   --  13.7   < >  --  13.3 12.9  HCT  --   --  42.7  --   --  40.3 39.5  PLT  --   --  275  --   --  301 262  APTT >200*  --   --   --   --   --   --   LABPROT 18.4*  --   --   --   --   --   --   INR 1.6*  --   --   --   --   --   --   HEPARINUNFRC  --    < >  --   --  0.55 0.48 0.55  CREATININE  --   --  0.73  --   --  0.86 0.86  TROPONINIHS 11  --   --   --   --   --   --    < > = values in this interval not displayed.    Estimated Creatinine Clearance (by C-G formula based on SCr of 0.86 mg/dL) Female: 08/30/20 mL/min Female: 102.5 mL/min   Medical History: Past Medical History:  Diagnosis Date  . COPD (chronic obstructive pulmonary disease) (HCC)   . Diabetes mellitus without complication (HCC)   . Kidney stone     Medications:  Scheduled:  . vitamin C  500 mg Oral Daily  . insulin aspart  0-20 Units Subcutaneous TID WC  . insulin aspart  10 Units Subcutaneous TID WC  . insulin glargine  30 Units Subcutaneous BID  . Ipratropium-Albuterol  1 puff Inhalation Q6H  . methylPREDNISolone (SOLU-MEDROL) injection  60 mg Intravenous Q12H  . sodium chloride flush  3 mL Intravenous Q12H  . zinc sulfate  220 mg Oral Daily   Infusions:  . sodium chloride    . heparin 1,400 Units/hr (06/30/20 0106)  . remdesivir 100 mg in NS 100 mL 100  mg (06/30/20 0859)   PRN:   Assessment: 59 yo female presents with increased shortness of breath and generalized weakness. A CT was done and the patient was found to have a pulmonary embolism without right heart strain. PTA the patient was not on any anticoagulants. The patient's CBC is WNL and there are no signs and symptoms of bleeding. Pharmacy is now consulted to start IV heparin for pulmonary embolism.  Heparin level remains therapeutic at 0.55 with heparin infusing at 1400 units/hr. CBC stable, no overt bleeding documented.   Plan is to transition pt to PO xarelto in AM. We will start around breakfast with the first dose.   Goal of Therapy:  Heparin level 0.3-0.7 units/ml Monitor platelets by anticoagulation protocol: Yes   Plan:  Continue heparin gtt at 1400 units/hr until 0800 tomorrow Transition to Xarelto 15mg  BID x21d then 20mg  with dinner  Ulyses Southward, PharmD, BCIDP, AAHIVP, CPP Infectious Disease Pharmacist 06/30/2020 4:21 PM

## 2020-06-30 NOTE — Plan of Care (Signed)
  Problem: Education: Goal: Ability to describe self-care measures that may prevent or decrease complications (Diabetes Survival Skills Education) will improve Outcome: Progressing Goal: Individualized Educational Video(s) Outcome: Progressing   Problem: Coping: Goal: Ability to adjust to condition or change in health will improve Outcome: Progressing   Problem: Fluid Volume: Goal: Ability to maintain a balanced intake and output will improve Outcome: Progressing   Problem: Health Behavior/Discharge Planning: Goal: Ability to identify and utilize available resources and services will improve Outcome: Progressing Goal: Ability to manage health-related needs will improve Outcome: Progressing   Problem: Metabolic: Goal: Ability to maintain appropriate glucose levels will improve Outcome: Progressing   Problem: Nutritional: Goal: Maintenance of adequate nutrition will improve Outcome: Progressing Goal: Progress toward achieving an optimal weight will improve Outcome: Progressing   Problem: Skin Integrity: Goal: Risk for impaired skin integrity will decrease Outcome: Progressing   Problem: Tissue Perfusion: Goal: Adequacy of tissue perfusion will improve Outcome: Progressing   Problem: Education: Goal: Knowledge of risk factors and measures for prevention of condition will improve Outcome: Progressing   Problem: Coping: Goal: Psychosocial and spiritual needs will be supported Outcome: Progressing   Problem: Respiratory: Goal: Will maintain a patent airway Outcome: Progressing Goal: Complications related to the disease process, condition or treatment will be avoided or minimized Outcome: Progressing   Problem: Education: Goal: Knowledge of General Education information will improve Description: Including pain rating scale, medication(s)/side effects and non-pharmacologic comfort measures Outcome: Progressing   Problem: Health Behavior/Discharge Planning: Goal:  Ability to manage health-related needs will improve Outcome: Progressing   Problem: Clinical Measurements: Goal: Ability to maintain clinical measurements within normal limits will improve Outcome: Progressing Goal: Will remain free from infection Outcome: Progressing Goal: Diagnostic test results will improve Outcome: Progressing Goal: Respiratory complications will improve Outcome: Progressing Goal: Cardiovascular complication will be avoided Outcome: Progressing   Problem: Activity: Goal: Risk for activity intolerance will decrease Outcome: Progressing   Problem: Nutrition: Goal: Adequate nutrition will be maintained Outcome: Progressing   Problem: Safety: Goal: Ability to remain free from injury will improve Outcome: Progressing   

## 2020-06-30 NOTE — Progress Notes (Signed)
   06/30/20 2040  Assess: MEWS Score  Temp 97.7 F (36.5 C)  BP (!) 115/56  Pulse Rate 66  Resp 18  Level of Consciousness Alert  SpO2 93 %  O2 Device Nasal Cannula  Patient Activity (if Appropriate) In bed  O2 Flow Rate (L/min) 2 L/min  Assess: MEWS Score  MEWS Temp 0  MEWS Systolic 0  MEWS Pulse 0  MEWS RR 0  MEWS LOC 0  MEWS Score 0  MEWS Score Color Green  Assess: if the MEWS score is Yellow or Red  Were vital signs taken at a resting state? Yes  Focused Assessment No change from prior assessment  Early Detection of Sepsis Score *See Row Information* Low  MEWS guidelines implemented *See Row Information* No, other (Comment) (no acute changes)  Treat  Pain Scale 0-10  Pain Score 0  Document  Progress note created (see row info) Yes

## 2020-06-30 NOTE — Progress Notes (Signed)
PROGRESS NOTE    Jill Barnes  ZOX:[id number] DOB: [birth year] DOA: 06/27/2020 PCP: Patient, No Pcp Per     Brief Narrative:  59 y.o. WF PMHx DM Type II uncontrolled with complication, COPD,, Tobacco Abuse and recent covid hospitalization, who presents with the above.  Covid positive 7/26, had symptoms for a couple days prior. Presented to this hospital 7/30 with dyspnea. Found to be hypoxic with covid pneumonia. Treated with o2, steroids, and remdesivir. Completed course of remdisivir. Discharged 8/2. Notes had steady improvement in breathing symptoms since start of covid treatment. However for past 2 days has had worsening shortness of breath. Occasional cough. Some tightness in chest when takes deep breath. No fevers. No n/v/d. No leg swelling. No hx of dvt/PE. Was not vaccinated.  ED Course: labs, imaging, heparin, antibiotics   Subjective: 8/9 A/O x4, positive S OB, positive left upper chest pain with deep inspiration, negative abdominal pain, negative left lower extremity pain   Assessment & Plan: Covid vaccination; no vaccination   Principal Problem:   Bilateral pulmonary embolism (HCC) Active Problems:   Nicotine dependence   Acute respiratory failure with hypoxia (HCC)   Acute hypoxemic respiratory failure due to COVID-19 [facility])   Type 2 diabetes mellitus without complication (HCC)   Respiratory failure (HCC)   Pulmonary embolism (HCC)   Obesity (BMI 30-39.9)   Acute deep vein thrombosis (DVT) of calf muscle vein of right lower extremity (HCC)   Pneumonia due to COVID-19 virus   Tobacco abuse   Diabetes mellitus type 2, uncontrolled, with complications (HCC)   Acute diastolic CHF (congestive heart failure) (HCC)  Acute respiratory failure with hypoxia/Covid pneumonia COVID-19 Labs -Just discharged on 8/2 for exact same issue; breakthrough  Vs relapse, given how recently patient discharged for relapse. -Covid inflammatory markers pending -flutter  valve -Incentive spirometer -Respimat BID QID -New onset O2 requirement, CRP>7 -8/7 Remdesivir -8/7 Solu-Medrol 60 mg BID -Vitamin C 500 mg daily -Zinc 220 mg daily  COVID-19 Labs  Recent Labs    06/27/20 2325 06/28/20 0744 06/28/20 1217 06/29/20 1726 06/30/20 0232  DDIMER 1.52*  --   --   --   --   FERRITIN  --   --  782* 1,001* 907*  LDH  --   --  202* 216* 214*  CRP  --    < > 11.7* 5.4* 4.3*   < > = values in this interval not displayed.    Lab Results  Component Value Date   SARSCOV2NAA POSITIVE (A) 06/27/2020   SARSCOV2NAA POSITIVE (A) 06/16/2020  -8/9   COPD/Tobacco abuse -Continues to smoke one PPD -Patient counseled on absolute need to discontinue smoking.  Acute bilateral PE/ACUTE RIGHT DVT -Most likely secondary to her Covid infection.  Tobacco abuse also exacerbating factor. -8/7 echocardiogram pending -8/7 lower extremity Doppler; multivein DVT RIGHT, see Doppler below -Continue heparin drip -8/7 consult NCM does patient have insurance?  What would be the best anticoagulant covered by her insurance? -Patient with mother who had multiple PEs/DVTs; ordered anticardiolipin antibody, lupus anticoagulant panel.  Remainder of hypercoagulability panel will have to wait until patient is off all of heparin, and acute phase is passed.. -8/9 spoke with NCM Ella.  Will start patient on Xarelto in the a.m. and she will work on obtaining [facility] program, an additional 30-day coupon for patient.  -8/9 Xarelto per pharmacy   Acute diastolic CHF -Strict in and out +1.6 L -Daily weight  Filed Weights   06/29/20 0648 06/29/20 1616 06/30/20  0500  Weight: 97.7 kg 97.9 kg 97.8 kg   DM type II uncontrolled with complication -7/30 hemoglobin A1c= 7.3 -We will hold patient's home medication glipizide, Actos. -Novolin 70/30 15 units BID (hold); home med -8/9 increase Lantus 30 units BID -8/9 NovoLog 10 units qac -Resistant SSI I -8/7 consult to diabetic coordinator  uncontrolled diabetic who is obese requires some counseling. -8/7 consult to nutritionist. uncontrolled diabetic who is obese requires some counseling  Obesity BMI 30-39.9 -see diabetes   DVT prophylaxis: Heparin drip Code Status: Full Family Communication:  Status is: Inpatient    Dispo: The patient is from: Home              Anticipated d/c is to: Home              Anticipated d/c date is: 8/17              Patient currently unstable      Consultants:    Procedures/Significant Events:  8/7 CTA chest PE protocol;-pulmonary artery emboli extend from the right intralobar pulmonary artery into the lower lobar and superior segmental pulmonaryarteries.  -Additional filling defects in the distal left main pulmonary artery extending into the left upper lobar and apical segmental branches as well as within the inferior lingular segmental branch. -No evidence of right heart strain. -. Patchy multifocal areas of mixed consolidation and ground-glass within the lungs, compatible with pneumonia given recent COVID-19 positivity. -Peripheral consolidation with central ground-glass compatible with an atoll sign in the inferior lingula in the distribution of a pulmonary embolus could reflect a developing pulmonary infarct. -Scattered low-attenuation subcentimeter nodes with a borderline enlarged AP window lymph node, likely reactive.6. Three-vessel coronary artery disease. 8/7 bilateral lower extremity Doppler;RIGHT: Acute DVT-RIGHT popliteal vein, right posterior tibial veins, right peroneal veins, and right gastrocnemius veins.  LEFT: Negative DVT 8/7 Echocardiogram;Left Ventricle: LVEF=60 to 65%.  -Grade I diastolic dysfunction (impaired relaxation).     I have personally reviewed and interpreted all radiology studies and my findings are as above.  VENTILATOR SETTINGS  Nasal cannula 8/9 Flow; 2 L/min SPO2; 92%   Cultures   Antimicrobials: Anti-infectives (From admission,  onward)   Start     Ordered Stop   06/29/20 1000  remdesivir 100 mg in sodium chloride 0.9 % 100 mL IVPB     Discontinue    "Followed by" Linked Group Details   06/28/20 1214 07/03/20 0959   06/28/20 1330  remdesivir 200 mg in sodium chloride 0.9% 250 mL IVPB       "Followed by" Linked Group Details   06/28/20 1214 06/28/20 1522   06/27/20 2115  cefTRIAXone (ROCEPHIN) 1 g in sodium chloride 0.9 % 100 mL IVPB        06/27/20 2101 06/27/20 2307   06/27/20 2115  azithromycin (ZITHROMAX) 500 mg in sodium chloride 0.9 % 250 mL IVPB  Status:  Discontinued        06/27/20 2101 06/27/20 2323       Devices    LINES / TUBES:      Continuous Infusions: . sodium chloride    . heparin 1,400 Units/hr (06/30/20 0106)  . remdesivir 100 mg in NS 100 mL 100 mg (06/29/20 1007)     Objective: Vitals:   06/29/20 1616 06/29/20 2010 06/30/20 0500 06/30/20 0519  BP: 99/86 122/68  (!) 157/86  Pulse: 76 82  80  Resp:  18  20  Temp: 98.7 F (37.1 C) 98 F (36.7 C)  98  F (36.7 C)  TempSrc: Axillary Oral  Oral  SpO2: 93% 93%  93%  Weight: 97.9 kg  97.8 kg   Height:        Intake/Output Summary (Last 24 hours) at 06/30/2020 0824 Last data filed at 06/30/2020 0548 Gross per 24 hour  Intake 1390.45 ml  Output --  Net 1390.45 ml   Filed Weights   06/29/20 0648 06/29/20 1616 06/30/20 0500  Weight: 97.7 kg 97.9 kg 97.8 kg    Examination:  General: A/O x4, positive acute respiratory distress Eyes: negative scleral hemorrhage, negative anisocoria, negative icterus ENT: Negative Runny nose, negative gingival bleeding, Neck:  Negative scars, masses, torticollis, lymphadenopathy, JVD Lungs: Clear to auscultation bilaterally without wheezes or crackles Cardiovascular: Regular rate and rhythm without murmur gallop or rub normal S1 and S2 Abdomen: negative abdominal pain, nondistended, positive soft, bowel sounds, no rebound, no ascites, no appreciable mass Extremities: No significant cyanosis,  clubbing, or edema bilateral lower extremities Skin: Negative rashes, lesions, ulcers Psychiatric:  Negative depression, negative anxiety, negative fatigue, negative mania  Central nervous system:  Cranial nerves II through XII intact, tongue/uvula midline, all extremities muscle strength 5/5, sensation intact throughout,negative dysarthria, negative expressive aphasia, negative receptive aphasia.  .     Data Reviewed: Care during the described time interval was provided by me .  I have reviewed this patient's available data, including medical history, events of note, physical examination, and all test results as part of my evaluation.  CBC: Recent Labs  Lab 06/27/20 1550 06/28/20 0744 06/29/20 1726 06/30/20 0232  WBC 20.0* 17.6* 21.2* 17.4*  NEUTROABS 17.0*  --  19.2* 15.8*  HGB 15.4* 13.7 13.3 12.9  HCT 48.4* 42.7 40.3 39.5  MCV 95.5 94.3 93.9 92.1  PLT 281 275 301 262   Basic Metabolic Panel: Recent Labs  Lab 06/27/20 1550 06/28/20 0744 06/29/20 1726 06/30/20 0232  NA 133* 134* 134* 137  K 4.1 4.2 4.0 4.2  CL 98 102 102 104  CO2 21* 22 20* 23  GLUCOSE 247* 216* 385* 323*  BUN 12 9 25* 27*  CREATININE 1.08* 0.73 0.86 0.86  CALCIUM 8.3* 8.3* 8.4* 8.4*  MG  --   --  2.0 2.1  PHOS  --   --  2.2* 3.2   GFR: Estimated Creatinine Clearance (by C-G formula based on SCr of 0.86 mg/dL) Female: 16.184.1 mL/min Female: 102.5 mL/min Liver Function Tests: Recent Labs  Lab 06/27/20 1550 06/29/20 1726 06/30/20 0232  AST 12* 18 16  ALT 14 19 19   ALKPHOS 112 82 89  BILITOT 1.3* 0.4 0.4  PROT 7.1 6.0* 6.1*  ALBUMIN 2.5* 2.0* 2.0*   No results for input(s): LIPASE, AMYLASE in the last 168 hours. No results for input(s): AMMONIA in the last 168 hours. Coagulation Profile: Recent Labs  Lab 06/27/20 2325  INR 1.6*   Cardiac Enzymes: No results for input(s): CKTOTAL, CKMB, CKMBINDEX, TROPONINI in the last 168 hours. BNP (last 3 results) No results for input(s): PROBNP in  the last 8760 hours. HbA1C: No results for input(s): HGBA1C in the last 72 hours. CBG: Recent Labs  Lab 06/29/20 0806 06/29/20 1207 06/29/20 1619 06/29/20 2120 06/30/20 0807  GLUCAP 320* 314* 410* 255* 401*   Lipid Profile: No results for input(s): CHOL, HDL, LDLCALC, TRIG, CHOLHDL, LDLDIRECT in the last 72 hours. Thyroid Function Tests: No results for input(s): TSH, T4TOTAL, FREET4, T3FREE, THYROIDAB in the last 72 hours. Anemia Panel: Recent Labs    06/29/20 1726 06/30/20 0232  FERRITIN 1,001* 907*   Sepsis Labs: Recent Labs  Lab 06/27/20 1550 06/27/20 2325  LATICACIDVEN 1.9 1.5    Recent Results (from the past 240 hour(s))  Respiratory Panel by PCR     Status: None   Collection Time: 06/21/20  1:25 PM   Specimen: Nasopharyngeal Swab; Respiratory  Result Value Ref Range Status   Adenovirus NOT DETECTED NOT DETECTED Final   Coronavirus 229E NOT DETECTED NOT DETECTED Final    Comment: (NOTE) The Coronavirus on the Respiratory Panel, DOES NOT test for the novel  Coronavirus (2019 nCoV)    Coronavirus HKU1 NOT DETECTED NOT DETECTED Final   Coronavirus NL63 NOT DETECTED NOT DETECTED Final   Coronavirus OC43 NOT DETECTED NOT DETECTED Final   Metapneumovirus NOT DETECTED NOT DETECTED Final   Rhinovirus / Enterovirus NOT DETECTED NOT DETECTED Final   Influenza A NOT DETECTED NOT DETECTED Final   Influenza B NOT DETECTED NOT DETECTED Final   Parainfluenza Virus 1 NOT DETECTED NOT DETECTED Final   Parainfluenza Virus 2 NOT DETECTED NOT DETECTED Final   Parainfluenza Virus 3 NOT DETECTED NOT DETECTED Final   Parainfluenza Virus 4 NOT DETECTED NOT DETECTED Final   Respiratory Syncytial Virus NOT DETECTED NOT DETECTED Final   Bordetella pertussis NOT DETECTED NOT DETECTED Final   Chlamydophila pneumoniae NOT DETECTED NOT DETECTED Final   Mycoplasma pneumoniae NOT DETECTED NOT DETECTED Final    Comment: Performed at [facility], [address] [place],  [place] [address]  Blood culture (routine x 2)     Status: None (Preliminary result)   Collection Time: 06/27/20  3:46 PM   Specimen: BLOOD RIGHT HAND  Result Value Ref Range Status   Specimen Description BLOOD RIGHT HAND  Final   Special Requests   Final    BOTTLES DRAWN AEROBIC ONLY Blood Culture results may not be optimal due to an inadequate volume of blood received in culture bottles   Culture   Final    NO GROWTH 2 DAYS Performed at [facility], [address], [place], [place] [address]    Report Status PENDING  Incomplete  Blood culture (routine x 2)     Status: None (Preliminary result)   Collection Time: 06/27/20  3:50 PM   Specimen: BLOOD  Result Value Ref Range Status   Specimen Description BLOOD LEFT ANTECUBITAL  Final   Special Requests   Final    BOTTLES DRAWN AEROBIC AND ANAEROBIC Blood Culture results may not be optimal due to an inadequate volume of blood received in culture bottles   Culture   Final    NO GROWTH 2 DAYS Performed at [facility], [address] [place], [place] [address]    Report Status PENDING  Incomplete  SARS Coronavirus 2 by RT PCR (hospital order, performed in [facility] hospital lab) Nasopharyngeal Nasopharyngeal Swab     Status: Abnormal   Collection Time: 06/27/20  6:01 PM   Specimen: Nasopharyngeal Swab  Result Value Ref Range Status   SARS Coronavirus 2 POSITIVE (A) NEGATIVE Final    Comment: RESULT CALLED TO, READ BACK BY AND VERIFIED WITH: Tally Due RN 06/27/20 1925 JDW (NOTE) SARS-CoV-2 target nucleic acids are DETECTED  SARS-CoV-2 RNA is generally detectable in upper respiratory specimens  during the acute phase of infection.  Positive results are indicative  of the presence of the identified virus, but do not rule out bacterial infection or co-infection with other pathogens not detected by the test.  Clinical correlation with patient history and  other diagnostic information is necessary to determine patient infection  status.  The expected result is negative.  Fact Sheet for Patients:   BoilerBrush.com.cy   Fact Sheet for Healthcare Providers:   https://pope.com/    This test is not yet approved or cleared by the [place] FDA and  has been authorized for detection and/or diagnosis of SARS-CoV-2 by FDA under an Emergency Use Authorization (EUA).  This EUA will remain in effect (meaning this test can b e used) for the duration of  the COVID-19 declaration under Section 564(b)(1) of the Act, 21 U.S.C. section 360-bbb-3(b)(1), unless the authorization is terminated or revoked sooner.  Performed at [facility], [address] [place], [place] [address]          Radiology Studies: ECHOCARDIOGRAM COMPLETE  Result Date: 06/28/2020    ECHOCARDIOGRAM REPORT   Patient Name:   MARLYS STEGMAIER Date of Exam: 06/28/2020 Medical Rec #:  [id number]        Height:       66.0 in Accession #:    [id number]       Weight:       230.0 lb Date of Birth:  [birth year]        BSA:          2.122 m Patient Age:    59 years         BP:           119/67 mmHg Patient Gender: F                HR:           82 bpm. Exam Location:  Inpatient Procedure: 2D Echo, Cardiac Doppler and Color Doppler Indications:    Pulmonary Embolus 415.19 / I26.99  History:        Patient has prior history of Echocardiogram examinations, most                 recent 12/16/2017. COPD; Risk Factors:Dyslipidemia. Tobacco                 abuse,Acute hypoxemic respiratory failure due to COVID-19,Acute                 deep vein thrombosis (DVT) of calf muscle vein of right lower                 extremity (HCC).  Sonographer:    Celesta Gentile RCS Referring Phys: [id number] Tu Shimmel J Elhadji Pecore IMPRESSIONS  1. Left ventricular ejection fraction, by estimation, is 60 to 65%. The left ventricle has normal function. The left ventricle has no regional wall motion abnormalities. There is mild concentric left ventricular  hypertrophy. Left ventricular diastolic parameters are consistent with Grade I diastolic dysfunction (impaired relaxation). Elevated left ventricular end-diastolic pressure.  2. Right ventricular systolic function is normal. The right ventricular size is normal. There is normal pulmonary artery systolic pressure.  3. The mitral valve is normal in structure. Trivial mitral valve regurgitation. No evidence of mitral stenosis.  4. The aortic valve has an indeterminant number of cusps. Aortic valve regurgitation is not visualized. Mild aortic valve sclerosis is present, with no evidence of aortic valve stenosis.  5. The inferior vena cava is normal in size with greater than 50% respiratory variability, suggesting right atrial pressure of 3 mmHg. FINDINGS  Left Ventricle: Left ventricular ejection fraction, by estimation, is 60 to 65%. The left ventricle has normal function. The  left ventricle has no regional wall motion abnormalities. The left ventricular internal cavity size was normal in size. There is  mild concentric left ventricular hypertrophy. Left ventricular diastolic parameters are consistent with Grade I diastolic dysfunction (impaired relaxation). Elevated left ventricular end-diastolic pressure. Right Ventricle: The right ventricular size is normal. No increase in right ventricular wall thickness. Right ventricular systolic function is normal. There is normal pulmonary artery systolic pressure. The tricuspid regurgitant velocity is 2.13 m/s, and  with an assumed right atrial pressure of 3 mmHg, the estimated right ventricular systolic pressure is 21.1 mmHg. Left Atrium: Left atrial size was normal in size. Right Atrium: Right atrial size was normal in size. Pericardium: There is no evidence of pericardial effusion. Mitral Valve: The mitral valve is normal in structure. Normal mobility of the mitral valve leaflets. Trivial mitral valve regurgitation. No evidence of mitral valve stenosis. Tricuspid Valve: The  tricuspid valve is normal in structure. Tricuspid valve regurgitation is trivial. No evidence of tricuspid stenosis. Aortic Valve: The aortic valve has an indeterminant number of cusps. Aortic valve regurgitation is not visualized. Mild aortic valve sclerosis is present, with no evidence of aortic valve stenosis. Pulmonic Valve: The pulmonic valve was normal in structure. Pulmonic valve regurgitation is not visualized. No evidence of pulmonic stenosis. Aorta: The aortic root is normal in size and structure. Venous: The inferior vena cava is normal in size with greater than 50% respiratory variability, suggesting right atrial pressure of 3 mmHg. IAS/Shunts: The interatrial septum appears to be lipomatous. No atrial level shunt detected by color flow Doppler.  LEFT VENTRICLE PLAX 2D LVIDd:         3.90 cm  Diastology LVIDs:         2.60 cm  LV e' lateral:   5.55 cm/s LV PW:         1.10 cm  LV E/e' lateral: 19.3 LV IVS:        1.20 cm  LV e' medial:    7.51 cm/s LVOT diam:     1.60 cm  LV E/e' medial:  14.2 LV SV:         56 LV SV Index:   26 LVOT Area:     2.01 cm  RIGHT VENTRICLE RV S prime:     15.70 cm/s TAPSE (M-mode): 2.2 cm LEFT ATRIUM             Index       RIGHT ATRIUM           Index LA diam:        2.60 cm 1.23 cm/m  RA Area:     11.40 cm LA Vol (A2C):   50.7 ml 23.87 ml/m RA Volume:   24.30 ml  11.45 ml/m LA Vol (A4C):   58.9 ml 27.75 ml/m LA Biplane Vol: 56.2 ml 26.48 ml/m  AORTIC VALVE LVOT Vmax:   105.00 cm/s LVOT Vmean:  66.200 cm/s LVOT VTI:    0.277 m  AORTA Ao Root diam: 3.10 cm MITRAL VALVE                TRICUSPID VALVE MV Area (PHT): 2.04 cm     TR Peak grad:   18.1 mmHg MV Decel Time: 371 msec     TR Vmax:        213.00 cm/s MV E velocity: 107.00 cm/s MV A velocity: 149.00 cm/s  SHUNTS MV E/A ratio:  0.72         Systemic VTI:  0.28 m                             Systemic Diam: 1.60 cm Armanda Magic MD Electronically signed by Armanda Magic MD Signature Date/Time: 06/28/2020/4:49:07 PM     Final    VAS [place] LOWER EXTREMITY VENOUS (DVT)  Result Date: 06/28/2020  Lower Venous DVTStudy Indications: Covid-19, and pulmonary embolism.  Comparison Study: No prior study on file Performing Technologist: Sherren Kerns RVS  Examination Guidelines: A complete evaluation includes B-mode imaging, spectral Doppler, color Doppler, and power Doppler as needed of all accessible portions of each vessel. Bilateral testing is considered an integral part of a complete examination. Limited examinations for reoccurring indications may be performed as noted. The reflux portion of the exam is performed with the patient in reverse Trendelenburg.  +---------+---------------+---------+-----------+----------+-----------------+ RIGHT    CompressibilityPhasicitySpontaneityPropertiesThrombus Aging    +---------+---------------+---------+-----------+----------+-----------------+ CFV      Full           Yes      Yes                                    +---------+---------------+---------+-----------+----------+-----------------+ SFJ      Full                                                           +---------+---------------+---------+-----------+----------+-----------------+ FV Prox  Full                                                           +---------+---------------+---------+-----------+----------+-----------------+ FV Mid   Full                                                           +---------+---------------+---------+-----------+----------+-----------------+ FV DistalFull                                                           +---------+---------------+---------+-----------+----------+-----------------+ PFV      Full                                                           +---------+---------------+---------+-----------+----------+-----------------+ POP      Full           No       No                   Age Indeterminate  +---------+---------------+---------+-----------+----------+-----------------+ PTV      Full  Age Indeterminate +---------+---------------+---------+-----------+----------+-----------------+ PERO     Full                                         Age Indeterminate +---------+---------------+---------+-----------+----------+-----------------+ Gastroc  Full           No       No                   Age Indeterminate +---------+---------------+---------+-----------+----------+-----------------+ popliteal, gastroc, posterior tibial, and peroneal veins all compress but are dilated and do not have color or Doppler flow present.  +---------+---------------+---------+-----------+----------+--------------+ LEFT     CompressibilityPhasicitySpontaneityPropertiesThrombus Aging +---------+---------------+---------+-----------+----------+--------------+ CFV      Full           Yes                                          +---------+---------------+---------+-----------+----------+--------------+ SFJ      Full                                                        +---------+---------------+---------+-----------+----------+--------------+ FV Prox  Full                                                        +---------+---------------+---------+-----------+----------+--------------+ FV Mid   Full                                                        +---------+---------------+---------+-----------+----------+--------------+ FV DistalFull                                                        +---------+---------------+---------+-----------+----------+--------------+ PFV      Full                                                        +---------+---------------+---------+-----------+----------+--------------+ POP      Full           No       No                                   +---------+---------------+---------+-----------+----------+--------------+ PTV      Full                                                        +---------+---------------+---------+-----------+----------+--------------+  PERO     Full                                                        +---------+---------------+---------+-----------+----------+--------------+ Gastroc  Full                                                        +---------+---------------+---------+-----------+----------+--------------+    Summary: RIGHT: - Findings consistent with age indeterminate deep vein thrombosis involving the right popliteal vein, right posterior tibial veins, right peroneal veins, and right gastrocnemius veins.  LEFT: - There is no evidence of deep vein thrombosis in the lower extremity.  *See table(s) above for measurements and observations. Electronically signed by Coral Else MD on 06/28/2020 at 5:50:24 PM.    Final         Scheduled Meds: . vitamin C  500 mg Oral Daily  . insulin aspart  0-20 Units Subcutaneous TID WC  . insulin glargine  20 Units Subcutaneous BID  . Ipratropium-Albuterol  1 puff Inhalation Q6H  . methylPREDNISolone (SOLU-MEDROL) injection  60 mg Intravenous Q12H  . sodium chloride flush  3 mL Intravenous Q12H  . zinc sulfate  220 mg Oral Daily   Continuous Infusions: . sodium chloride    . heparin 1,400 Units/hr (06/30/20 0106)  . remdesivir 100 mg in NS 100 mL 100 mg (06/29/20 1007)     LOS: 3 days    Time spent:40 min    Clarity Ciszek, Roselind Messier, MD [facility] Pager [phone]  If 7PM-7AM, please contact night-coverage [email or web address] Password [facility] 06/30/2020, 8:24 AM

## 2020-06-30 NOTE — Progress Notes (Signed)
ANTICOAGULATION CONSULT NOTE   Pharmacy Consult for heparin Indication: pulmonary embolus/VTE  Allergies  Allergen Reactions  . Sulfa Antibiotics Shortness Of Breath and Swelling    All-over swelling  . Penicillins Rash    Patient Measurements: Height: 5\' 6"  (167.6 cm) Weight: 97.8 kg (215 lb 9.6 oz) IBW/kg (Calculated) : 59.3 Heparin Dosing Weight: 83.2 kg  Vital Signs: Temp: 98 F (36.7 C) (08/09 0519) Temp Source: Oral (08/09 0519) BP: 157/86 (08/09 0519) Pulse Rate: 80 (08/09 0519)  Labs: Recent Labs    06/27/20 2325 06/28/20 0739 06/28/20 0744 06/28/20 0744 06/28/20 1453 06/29/20 1726 06/30/20 0232  HGB  --   --  13.7   < >  --  13.3 12.9  HCT  --   --  42.7  --   --  40.3 39.5  PLT  --   --  275  --   --  301 262  APTT >200*  --   --   --   --   --   --   LABPROT 18.4*  --   --   --   --   --   --   INR 1.6*  --   --   --   --   --   --   HEPARINUNFRC  --    < >  --   --  0.55 0.48 0.55  CREATININE  --   --  0.73  --   --  0.86 0.86  TROPONINIHS 11  --   --   --   --   --   --    < > = values in this interval not displayed.    Estimated Creatinine Clearance (by C-G formula based on SCr of 0.86 mg/dL) Female: 08/30/20 mL/min Female: 102.5 mL/min   Medical History: Past Medical History:  Diagnosis Date  . COPD (chronic obstructive pulmonary disease) (HCC)   . Diabetes mellitus without complication (HCC)   . Kidney stone     Medications:  Scheduled:  . vitamin C  500 mg Oral Daily  . insulin aspart  0-20 Units Subcutaneous TID WC  . insulin aspart  10 Units Subcutaneous TID WC  . insulin glargine  30 Units Subcutaneous BID  . Ipratropium-Albuterol  1 puff Inhalation Q6H  . methylPREDNISolone (SOLU-MEDROL) injection  60 mg Intravenous Q12H  . sodium chloride flush  3 mL Intravenous Q12H  . zinc sulfate  220 mg Oral Daily   Infusions:  . sodium chloride    . heparin 1,400 Units/hr (06/30/20 0106)  . remdesivir 100 mg in NS 100 mL 100 mg (06/29/20  1007)   PRN:   Assessment: 59 yo female presents with increased shortness of breath and generalized weakness. A CT was done and the patient was found to have a pulmonary embolism without right heart strain. PTA the patient was not on any anticoagulants. The patient's CBC is WNL and there are no signs and symptoms of bleeding. Pharmacy is now consulted to start IV heparin for pulmonary embolism.  Heparin level remains therapeutic at 0.55 with heparin infusing at 1400 units/hr. CBC stable, no overt bleeding documented.   Goal of Therapy:  Heparin level 0.3-0.7 units/ml Monitor platelets by anticoagulation protocol: Yes   Plan:  Continue heparin gtt at 1400 units/hr F/u daily HL, CBC, and signs of bleeding F/u long term AC plan  Nataliah Hatlestad P. 42, PharmD, BCPS Clinical Pharmacist [place] Please utilize Amion for appropriate phone number to reach the unit  pharmacist [facility]) 06/30/2020 8:56 AM

## 2020-06-30 NOTE — TOC Progression Note (Addendum)
Transition of Care [facility]) - Progression Note    Patient Details  Name: Jill Barnes MRN: [id number] Date of Birth: 12-18-1960  Transition of Care [facility]) CM/SW Contact  Beckie Busing, RN Phone Number: [phone]  06/30/2020, 9:11 AM  Clinical Narrative:    [facility] consulted for medication cost check. CM will need dose and frequency in order to have CM assistant to do cost check. Message sent to MD. Will initiate cost check when information is obtained from MD.         Expected Discharge Plan and Services                                                 Social Determinants of Health (SDOH) Interventions    Readmission Risk Interventions No flowsheet data found.

## 2020-07-01 DIAGNOSIS — E118 Type 2 diabetes mellitus with unspecified complications: Secondary | ICD-10-CM

## 2020-07-01 DIAGNOSIS — I2601 Septic pulmonary embolism with acute cor pulmonale: Secondary | ICD-10-CM

## 2020-07-01 DIAGNOSIS — U071 COVID-19: Principal | ICD-10-CM

## 2020-07-01 DIAGNOSIS — J1282 Pneumonia due to coronavirus disease 2019: Secondary | ICD-10-CM

## 2020-07-01 DIAGNOSIS — I5031 Acute diastolic (congestive) heart failure: Secondary | ICD-10-CM

## 2020-07-01 DIAGNOSIS — Z720 Tobacco use: Secondary | ICD-10-CM

## 2020-07-01 DIAGNOSIS — J189 Pneumonia, unspecified organism: Secondary | ICD-10-CM

## 2020-07-01 DIAGNOSIS — E1165 Type 2 diabetes mellitus with hyperglycemia: Secondary | ICD-10-CM

## 2020-07-01 DIAGNOSIS — E669 Obesity, unspecified: Secondary | ICD-10-CM

## 2020-07-01 LAB — CBC WITH DIFFERENTIAL/PLATELET
Abs Immature Granulocytes: 0.1 10*3/uL — ABNORMAL HIGH (ref 0.00–0.07)
Basophils Absolute: 0 10*3/uL (ref 0.0–0.1)
Basophils Relative: 0 %
Eosinophils Absolute: 0 10*3/uL (ref 0.0–0.5)
Eosinophils Relative: 0 %
HCT: 40.5 % (ref 36.0–46.0)
Hemoglobin: 13.1 g/dL (ref 12.0–15.0)
Immature Granulocytes: 1 %
Lymphocytes Relative: 10 %
Lymphs Abs: 1.4 10*3/uL (ref 0.7–4.0)
MCH: 30 pg (ref 26.0–34.0)
MCHC: 32.3 g/dL (ref 30.0–36.0)
MCV: 92.7 fL (ref 80.0–100.0)
Monocytes Absolute: 0.6 10*3/uL (ref 0.1–1.0)
Monocytes Relative: 4 %
Neutro Abs: 12.3 10*3/uL — ABNORMAL HIGH (ref 1.7–7.7)
Neutrophils Relative %: 85 %
Platelets: 275 10*3/uL (ref 150–400)
RBC: 4.37 MIL/uL (ref 3.87–5.11)
RDW: 13.4 % (ref 11.5–15.5)
WBC: 14.3 10*3/uL — ABNORMAL HIGH (ref 4.0–10.5)
nRBC: 0 % (ref 0.0–0.2)

## 2020-07-01 LAB — COMPREHENSIVE METABOLIC PANEL
ALT: 23 U/L (ref 0–44)
AST: 19 U/L (ref 15–41)
Albumin: 2.1 g/dL — ABNORMAL LOW (ref 3.5–5.0)
Alkaline Phosphatase: 75 U/L (ref 38–126)
Anion gap: 12 (ref 5–15)
BUN: 23 mg/dL — ABNORMAL HIGH (ref 6–20)
CO2: 23 mmol/L (ref 22–32)
Calcium: 8.4 mg/dL — ABNORMAL LOW (ref 8.9–10.3)
Chloride: 105 mmol/L (ref 98–111)
Creatinine, Ser: 0.68 mg/dL (ref 0.44–1.00)
GFR calc Af Amer: >60 mL/min (ref >60)
GFR calc non Af Amer: >60 mL/min (ref >60)
Glucose, Bld: 124 mg/dL — ABNORMAL HIGH (ref 70–99)
Potassium: 4.2 mmol/L (ref 3.5–5.1)
Sodium: 140 mmol/L (ref 135–145)
Total Bilirubin: 0.5 mg/dL (ref 0.3–1.2)
Total Protein: 5.9 g/dL — ABNORMAL LOW (ref 6.5–8.1)

## 2020-07-01 LAB — DRVVT CONFIRM: dRVVT Confirm: 1.4 ratio — ABNORMAL HIGH (ref 0.8–1.2)

## 2020-07-01 LAB — LUPUS ANTICOAGULANT PANEL
DRVVT: 66.2 s — ABNORMAL HIGH (ref 0.0–47.0)
PTT Lupus Anticoagulant: 44.9 s (ref 0.0–51.9)

## 2020-07-01 LAB — GLUCOSE, CAPILLARY
Glucose-Capillary: 123 mg/dL — ABNORMAL HIGH (ref 70–99)
Glucose-Capillary: 153 mg/dL — ABNORMAL HIGH (ref 70–99)
Glucose-Capillary: 238 mg/dL — ABNORMAL HIGH (ref 70–99)
Glucose-Capillary: 349 mg/dL — ABNORMAL HIGH (ref 70–99)

## 2020-07-01 LAB — HEPARIN LEVEL (UNFRACTIONATED): Heparin Unfractionated: 0.63 IU/mL (ref 0.30–0.70)

## 2020-07-01 LAB — LACTATE DEHYDROGENASE: LDH: 200 U/L — ABNORMAL HIGH (ref 98–192)

## 2020-07-01 LAB — MAGNESIUM: Magnesium: 1.9 mg/dL (ref 1.7–2.4)

## 2020-07-01 LAB — PHOSPHORUS: Phosphorus: 3.8 mg/dL (ref 2.5–4.6)

## 2020-07-01 LAB — DRVVT MIX: dRVVT Mix: 44.6 s — ABNORMAL HIGH (ref 0.0–40.4)

## 2020-07-01 LAB — C-REACTIVE PROTEIN: CRP: 1.6 mg/dL — ABNORMAL HIGH (ref <1.0)

## 2020-07-01 LAB — FERRITIN: Ferritin: 713 ng/mL — ABNORMAL HIGH (ref 11–307)

## 2020-07-01 MED ORDER — INSULIN ASPART 100 UNIT/ML ~~LOC~~ SOLN
14.0000 [IU] | Freq: Three times a day (TID) | SUBCUTANEOUS | Status: DC
  Administered 2020-07-02 (×2): 14 [IU] via SUBCUTANEOUS

## 2020-07-01 NOTE — TOC Progression Note (Signed)
Transition of Care [facility]) - Progression Note    Patient Details  Name: Jill Barnes MRN: [id number] Date of Birth: 1961/04/28  Transition of Care [facility]) CM/SW Contact  Beckie Busing, RN Phone Number: [phone]  07/01/2020, 9:48 AM  Clinical Narrative:    Benefits check for pricing on xarelto submitted. MATCH for complete for 30 day supply of meds. TOC will continue to follow.          Expected Discharge Plan and Services                                                 Social Determinants of Health (SDOH) Interventions    Readmission Risk Interventions No flowsheet data found.

## 2020-07-01 NOTE — TOC Benefit Eligibility Note (Signed)
Transition of Care [facility]) Benefit Eligibility Note    Patient Details  Name: Jill Barnes MRN: [id number] Date of Birth: 1960/12/20   Medication/Dose: Carlena Hurl  15 MG BID        Prescription Coverage Preferred Pharmacy: [facility]              Additional Notes: NO PHARMACY BENEFITS  ON FILE  AND  I CALL [facility]  PATIENT HAS A DISCOUNT CARD    Mardene Sayer Phone Number: 07/01/2020, 12:02 PM

## 2020-07-01 NOTE — Plan of Care (Signed)
  Problem: Education: Goal: Ability to describe self-care measures that may prevent or decrease complications (Diabetes Survival Skills Education) will improve Outcome: Progressing Goal: Individualized Educational Video(s) Outcome: Progressing   Problem: Coping: Goal: Ability to adjust to condition or change in health will improve Outcome: Progressing   Problem: Fluid Volume: Goal: Ability to maintain a balanced intake and output will improve Outcome: Progressing   Problem: Health Behavior/Discharge Planning: Goal: Ability to identify and utilize available resources and services will improve Outcome: Progressing Goal: Ability to manage health-related needs will improve Outcome: Progressing   Problem: Metabolic: Goal: Ability to maintain appropriate glucose levels will improve Outcome: Progressing   Problem: Nutritional: Goal: Maintenance of adequate nutrition will improve Outcome: Progressing Goal: Progress toward achieving an optimal weight will improve Outcome: Progressing   Problem: Skin Integrity: Goal: Risk for impaired skin integrity will decrease Outcome: Progressing   Problem: Tissue Perfusion: Goal: Adequacy of tissue perfusion will improve Outcome: Progressing   Problem: Education: Goal: Knowledge of risk factors and measures for prevention of condition will improve Outcome: Progressing   Problem: Coping: Goal: Psychosocial and spiritual needs will be supported Outcome: Progressing   Problem: Respiratory: Goal: Will maintain a patent airway Outcome: Progressing Goal: Complications related to the disease process, condition or treatment will be avoided or minimized Outcome: Progressing   Problem: Education: Goal: Knowledge of General Education information will improve Description: Including pain rating scale, medication(s)/side effects and non-pharmacologic comfort measures Outcome: Progressing   Problem: Health Behavior/Discharge Planning: Goal:  Ability to manage health-related needs will improve Outcome: Progressing   Problem: Clinical Measurements: Goal: Ability to maintain clinical measurements within normal limits will improve Outcome: Progressing Goal: Will remain free from infection Outcome: Progressing Goal: Diagnostic test results will improve Outcome: Progressing Goal: Respiratory complications will improve Outcome: Progressing Goal: Cardiovascular complication will be avoided Outcome: Progressing   Problem: Activity: Goal: Risk for activity intolerance will decrease Outcome: Progressing   Problem: Nutrition: Goal: Adequate nutrition will be maintained Outcome: Progressing   Problem: Safety: Goal: Ability to remain free from injury will improve Outcome: Progressing

## 2020-07-01 NOTE — Progress Notes (Signed)
PROGRESS NOTE    Jill OfficerDiane R Barnes  ZOX:[id number] DOB: [birth year] DOA: 06/27/2020 PCP: Patient, No Pcp Per     Brief Narrative:  59 y.o. WF PMHx DM Type II uncontrolled with complication, COPD,, Tobacco Abuse and recent covid hospitalization, who presents with the above.  Covid positive 7/26, had symptoms for a couple days prior. Presented to this hospital 7/30 with dyspnea. Found to be hypoxic with covid pneumonia. Treated with o2, steroids, and remdesivir. Completed course of remdisivir. Discharged 8/2. Notes had steady improvement in breathing symptoms since start of covid treatment. However for past 2 days has had worsening shortness of breath. Occasional cough. Some tightness in chest when takes deep breath. No fevers. No n/v/d. No leg swelling. No hx of dvt/PE. Was not vaccinated.  ED Course: labs, imaging, heparin, antibiotics   Subjective: 8/10 A/O x4, negative S OB, positive left upper chest pain improved.  Negative abdominal pain.  Negative RIGHT lower extremity pain-further discussion with patient today revealed that patient's mother DVT secondary to a fracture of her extremity i.e. provoked DVT.   Assessment & Plan: Covid vaccination; no vaccination   Principal Problem:   Bilateral pulmonary embolism (HCC) Active Problems:   Nicotine dependence   Acute respiratory failure with hypoxia (HCC)   Acute hypoxemic respiratory failure due to COVID-19 [facility](HCC)   Type 2 diabetes mellitus without complication (HCC)   Respiratory failure (HCC)   Pulmonary embolism (HCC)   Obesity (BMI 30-39.9)   Acute deep vein thrombosis (DVT) of calf muscle vein of right lower extremity (HCC)   Pneumonia due to COVID-19 virus   Tobacco abuse   Diabetes mellitus type 2, uncontrolled, with complications (HCC)   Acute diastolic CHF (congestive heart failure) (HCC)  Acute respiratory failure with hypoxia/Covid pneumonia COVID-19 Labs -Just discharged on 8/2 for exact same issue; breakthrough  Vs  relapse, given how recently patient discharged for relapse. -flutter valve -Incentive spirometer -Respimat BID QID -New onset O2 requirement, CRP>7 -8/7 Remdesivir -8/7 Solu-Medrol 60 mg BID -Vitamin C 500 mg daily -Zinc 220 mg daily  COVID-19 Labs  Recent Labs    06/29/20 1726 06/30/20 0232 07/01/20 0715  FERRITIN 1,001* 907* 713*  LDH 216* 214* 200*  CRP 5.4* 4.3* 1.6*    Lab Results  Component Value Date   SARSCOV2NAA POSITIVE (A) 06/27/2020   SARSCOV2NAA POSITIVE (A) 06/16/2020    COPD/Tobacco abuse -Continues to smoke one PPD -Patient counseled on absolute need to discontinue smoking.  Acute bilateral PE/ACUTE RIGHT DVT -Most likely secondary to her Covid infection.  Tobacco abuse also exacerbating factor. -8/7 echocardiogram pending -8/7 lower extremity Doppler; multivein DVT RIGHT, see Doppler below -Continue heparin drip -8/7 consult NCM does patient have insurance?  What would be the best anticoagulant covered by her insurance? -Patient with mother who had multiple PEs/DVTs; ordered anticardiolipin antibody, lupus anticoagulant panel.  Remainder of hypercoagulability panel will have to wait until patient is off all of heparin, and acute phase is passed.. -8/9 spoke with NCM Ella.  Will start patient on Xarelto in the a.m. and she will work on obtaining MATCH program, and additional 30-day coupon for patient.  -8/9 Xarelto per pharmacy   Acute diastolic CHF -Strict in and out +2.6 L -Daily weight  Filed Weights   06/29/20 0648 06/29/20 1616 06/30/20 0500  Weight: 97.7 kg 97.9 kg 97.8 kg   DM type II uncontrolled with complication -7/30 hemoglobin A1c= 7.3 -We will hold patient's home medication glipizide, Actos. -Novolin 70/30 15 units BID (hold);  home med -8/9 increase Lantus 30 units BID -8/10 increase NovoLog 14 units qac  -Resistant SSI I -8/7 consult to diabetic coordinator uncontrolled diabetic who is obese requires some counseling. -8/7 consult  to nutritionist. uncontrolled diabetic who is obese requires some counseling  Obesity BMI 30-39.9 -see diabetes   DVT prophylaxis: Xarelto Code Status: Full Family Communication:  Status is: Inpatient    Dispo: The patient is from: Home              Anticipated d/c is to: Home              Anticipated d/c date is: 8/17              Patient currently unstable      Consultants:    Procedures/Significant Events:  8/7 CTA chest PE protocol;-pulmonary artery emboli extend from the right intralobar pulmonary artery into the lower lobar and superior segmental pulmonaryarteries.  -Additional filling defects in the distal left main pulmonary artery extending into the left upper lobar and apical segmental branches as well as within the inferior lingular segmental branch. -No evidence of right heart strain. -. Patchy multifocal areas of mixed consolidation and ground-glass within the lungs, compatible with pneumonia given recent COVID-19 positivity. -Peripheral consolidation with central ground-glass compatible with an atoll sign in the inferior lingula in the distribution of a pulmonary embolus could reflect a developing pulmonary infarct. -Scattered low-attenuation subcentimeter nodes with a borderline enlarged AP window lymph node, likely reactive.6. Three-vessel coronary artery disease. 8/7 bilateral lower extremity Doppler;RIGHT: Acute DVT-RIGHT popliteal vein, right posterior tibial veins, right peroneal veins, and right gastrocnemius veins.  LEFT: Negative DVT 8/7 Echocardiogram;Left Ventricle: LVEF=60 to 65%.  -Grade I diastolic dysfunction (impaired relaxation).     I have personally reviewed and interpreted all radiology studies and my findings are as above.  VENTILATOR SETTINGS  Nasal cannula 8/10 Flow; room air SPO2; 91%   Cultures   Antimicrobials: Anti-infectives (From admission, onward)   Start     Ordered Stop   06/29/20 1000  remdesivir 100 mg in sodium  chloride 0.9 % 100 mL IVPB     Discontinue    "Followed by" Linked Group Details   06/28/20 1214 07/03/20 0959   06/28/20 1330  remdesivir 200 mg in sodium chloride 0.9% 250 mL IVPB       "Followed by" Linked Group Details   06/28/20 1214 06/28/20 1522   06/27/20 2115  cefTRIAXone (ROCEPHIN) 1 g in sodium chloride 0.9 % 100 mL IVPB        06/27/20 2101 06/27/20 2307   06/27/20 2115  azithromycin (ZITHROMAX) 500 mg in sodium chloride 0.9 % 250 mL IVPB  Status:  Discontinued        06/27/20 2101 06/27/20 2323       Devices    LINES / TUBES:      Continuous Infusions: . sodium chloride    . remdesivir 100 mg in NS 100 mL 100 mg (07/01/20 0828)     Objective: Vitals:   06/30/20 1243 06/30/20 2040 06/30/20 2111 07/01/20 1245  BP: 126/71 (!) 115/56 126/65 120/69  Pulse: 64 66 68 62  Resp: Temp: 97.7 F (36.5 C) 97.7 F (36.5 C) 97.6 F (36.4 C) 97.7 F (36.5 C)  TempSrc: Axillary Oral Oral Oral  SpO2: 93% 93% 91% 91%  Weight:      Height:        Intake/Output Summary (Last 24 hours) at 07/01/2020 1758  Last data filed at 07/01/2020 1300 Gross per 24 hour  Intake 618.1 ml  Output --  Net 618.1 ml   Filed Weights   06/29/20 0648 06/29/20 1616 06/30/20 0500  Weight: 97.7 kg 97.9 kg 97.8 kg    Examination:  General: A/O x4, positive acute respiratory distress Eyes: negative scleral hemorrhage, negative anisocoria, negative icterus ENT: Negative Runny nose, negative gingival bleeding, Neck:  Negative scars, masses, torticollis, lymphadenopathy, JVD Lungs: Clear to auscultation bilaterally without wheezes or crackles Cardiovascular: Regular rate and rhythm without murmur gallop or rub normal S1 and S2 Abdomen: negative abdominal pain, nondistended, positive soft, bowel sounds, no rebound, no ascites, no appreciable mass Extremities: No significant cyanosis, clubbing, or edema bilateral lower extremities Skin: Negative rashes, lesions,  ulcers Psychiatric:  Negative depression, negative anxiety, negative fatigue, negative mania  Central nervous system:  Cranial nerves II through XII intact, tongue/uvula midline, all extremities muscle strength 5/5, sensation intact throughout,negative dysarthria, negative expressive aphasia, negative receptive aphasia.  .     Data Reviewed: Care during the described time interval was provided by me .  I have reviewed this patient's available data, including medical history, events of note, physical examination, and all test results as part of my evaluation.  CBC: Recent Labs  Lab 06/27/20 1550 06/28/20 0744 06/29/20 1726 06/30/20 0232 07/01/20 0715  WBC 20.0* 17.6* 21.2* 17.4* 14.3*  NEUTROABS 17.0*  --  19.2* 15.8* 12.3*  HGB 15.4* 13.7 13.3 12.9 13.1  HCT 48.4* 42.7 40.3 39.5 40.5  MCV 95.5 94.3 93.9 92.1 92.7  PLT 281 275 301 262 275   Basic Metabolic Panel: Recent Labs  Lab 06/27/20 1550 06/28/20 0744 06/29/20 1726 06/30/20 0232 07/01/20 0715  NA 133* 134* 134* 137 140  K 4.1 4.2 4.0 4.2 4.2  CL 98 102 102 104 105  CO2 21* 22 20* 23 23  GLUCOSE 247* 216* 385* 323* 124*  BUN 12 9 25* 27* 23*  CREATININE 1.08* 0.73 0.86 0.86 0.68  CALCIUM 8.3* 8.3* 8.4* 8.4* 8.4*  MG  --   --  2.0 2.1 1.9  PHOS  --   --  2.2* 3.2 3.8   GFR: Estimated Creatinine Clearance (by C-G formula based on SCr of 0.68 mg/dL) Female: 44.9 mL/min Female: 110.2 mL/min Liver Function Tests: Recent Labs  Lab 06/27/20 1550 06/29/20 1726 06/30/20 0232 07/01/20 0715  AST 12* 18 16 19   ALT 14 19 19 23   ALKPHOS 112 82 89 75  BILITOT 1.3* 0.4 0.4 0.5  PROT 7.1 6.0* 6.1* 5.9*  ALBUMIN 2.5* 2.0* 2.0* 2.1*   No results for input(s): LIPASE, AMYLASE in the last 168 hours. No results for input(s): AMMONIA in the last 168 hours. Coagulation Profile: Recent Labs  Lab 06/27/20 2325  INR 1.6*   Cardiac Enzymes: No results for input(s): CKTOTAL, CKMB, CKMBINDEX, TROPONINI in the last 168  hours. BNP (last 3 results) No results for input(s): PROBNP in the last 8760 hours. HbA1C: No results for input(s): HGBA1C in the last 72 hours. CBG: Recent Labs  Lab 06/30/20 1602 06/30/20 2110 07/01/20 0739 07/01/20 1146 07/01/20 1616  GLUCAP 282* 259* 123* 153* 238*   Lipid Profile: No results for input(s): CHOL, HDL, LDLCALC, TRIG, CHOLHDL, LDLDIRECT in the last 72 hours. Thyroid Function Tests: No results for input(s): TSH, T4TOTAL, FREET4, T3FREE, THYROIDAB in the last 72 hours. Anemia Panel: Recent Labs    06/30/20 0232 07/01/20 0715  FERRITIN 907* 713*   Sepsis Labs: Recent Labs  Lab  06/27/20 1550 06/27/20 2325  LATICACIDVEN 1.9 1.5    Recent Results (from the past 240 hour(s))  Blood culture (routine x 2)     Status: None (Preliminary result)   Collection Time: 06/27/20  3:46 PM   Specimen: BLOOD RIGHT HAND  Result Value Ref Range Status   Specimen Description BLOOD RIGHT HAND  Final   Special Requests   Final    BOTTLES DRAWN AEROBIC ONLY Blood Culture results may not be optimal due to an inadequate volume of blood received in culture bottles   Culture   Final    NO GROWTH 4 DAYS Performed at [facility], [address] [place], [place] [address]    Report Status PENDING  Incomplete  Blood culture (routine x 2)     Status: None (Preliminary result)   Collection Time: 06/27/20  3:50 PM   Specimen: BLOOD  Result Value Ref Range Status   Specimen Description BLOOD LEFT ANTECUBITAL  Final   Special Requests   Final    BOTTLES DRAWN AEROBIC AND ANAEROBIC Blood Culture results may not be optimal due to an inadequate volume of blood received in culture bottles   Culture   Final    NO GROWTH 4 DAYS Performed at [facility], [address], [place], [place] [address]    Report Status PENDING  Incomplete  SARS Coronavirus 2 by RT PCR (hospital order, performed in [facility] hospital lab) Nasopharyngeal Nasopharyngeal Swab     Status: Abnormal    Collection Time: 06/27/20  6:01 PM   Specimen: Nasopharyngeal Swab  Result Value Ref Range Status   SARS Coronavirus 2 POSITIVE (A) NEGATIVE Final    Comment: RESULT CALLED TO, READ BACK BY AND VERIFIED WITH: Tally Due RN 06/27/20 1925 JDW (NOTE) SARS-CoV-2 target nucleic acids are DETECTED  SARS-CoV-2 RNA is generally detectable in upper respiratory specimens  during the acute phase of infection.  Positive results are indicative  of the presence of the identified virus, but do not rule out bacterial infection or co-infection with other pathogens not detected by the test.  Clinical correlation with patient history and  other diagnostic information is necessary to determine patient infection status.  The expected result is negative.  Fact Sheet for Patients:   BoilerBrush.com.cy   Fact Sheet for Healthcare Providers:   https://pope.com/    This test is not yet approved or cleared by the [place] FDA and  has been authorized for detection and/or diagnosis of SARS-CoV-2 by FDA under an Emergency Use Authorization (EUA).  This EUA will remain in effect (meaning this test can b e used) for the duration of  the COVID-19 declaration under Section 564(b)(1) of the Act, 21 U.S.C. section 360-bbb-3(b)(1), unless the authorization is terminated or revoked sooner.  Performed at [facility] Lab, [address] [place], [place] [address]          Radiology Studies: No results found.      Scheduled Meds: . vitamin C  500 mg Oral Daily  . insulin aspart  0-20 Units Subcutaneous TID WC  . insulin aspart  10 Units Subcutaneous TID WC  . insulin glargine  30 Units Subcutaneous BID  . Ipratropium-Albuterol  1 puff Inhalation Q6H  . mouth rinse  15 mL Mouth Rinse BID  . methylPREDNISolone (SOLU-MEDROL) injection  60 mg Intravenous Q12H  . rivaroxaban  15 mg Oral BID WC   Followed by  . [START ON 07/22/2020] rivaroxaban  20 mg  Oral Q supper  . sodium chloride flush  3 mL Intravenous Q12H  . zinc sulfate  220 mg Oral Daily   Continuous Infusions: . sodium chloride    . remdesivir 100 mg in NS 100 mL 100 mg (07/01/20 0828)     LOS: 4 days    Time spent:40 min    Annitta Fifield, Roselind Messier, MD [facility] Pager [phone]  If 7PM-7AM, please contact night-coverage [email or web address] Password [facility] 07/01/2020, 5:58 PM

## 2020-07-01 NOTE — Discharge Instructions (Signed)

## 2020-07-02 DIAGNOSIS — I82461 Acute embolism and thrombosis of right calf muscular vein: Secondary | ICD-10-CM

## 2020-07-02 DIAGNOSIS — F17219 Nicotine dependence, cigarettes, with unspecified nicotine-induced disorders: Secondary | ICD-10-CM

## 2020-07-02 DIAGNOSIS — J9601 Acute respiratory failure with hypoxia: Secondary | ICD-10-CM

## 2020-07-02 LAB — CBC WITH DIFFERENTIAL/PLATELET
Abs Immature Granulocytes: 0.11 10*3/uL — ABNORMAL HIGH (ref 0.00–0.07)
Basophils Absolute: 0 10*3/uL (ref 0.0–0.1)
Basophils Relative: 0 %
Eosinophils Absolute: 0 10*3/uL (ref 0.0–0.5)
Eosinophils Relative: 0 %
HCT: 41.9 % (ref 36.0–46.0)
Hemoglobin: 13.9 g/dL (ref 12.0–15.0)
Immature Granulocytes: 1 %
Lymphocytes Relative: 23 %
Lymphs Abs: 3.1 10*3/uL (ref 0.7–4.0)
MCH: 31 pg (ref 26.0–34.0)
MCHC: 33.2 g/dL (ref 30.0–36.0)
MCV: 93.3 fL (ref 80.0–100.0)
Monocytes Absolute: 1 10*3/uL (ref 0.1–1.0)
Monocytes Relative: 8 %
Neutro Abs: 9.4 10*3/uL — ABNORMAL HIGH (ref 1.7–7.7)
Neutrophils Relative %: 68 %
Platelets: 287 10*3/uL (ref 150–400)
RBC: 4.49 MIL/uL (ref 3.87–5.11)
RDW: 13.4 % (ref 11.5–15.5)
WBC: 13.7 10*3/uL — ABNORMAL HIGH (ref 4.0–10.5)
nRBC: 0 % (ref 0.0–0.2)

## 2020-07-02 LAB — COMPREHENSIVE METABOLIC PANEL
ALT: 24 U/L (ref 0–44)
AST: 19 U/L (ref 15–41)
Albumin: 2.1 g/dL — ABNORMAL LOW (ref 3.5–5.0)
Alkaline Phosphatase: 80 U/L (ref 38–126)
Anion gap: 10 (ref 5–15)
BUN: 25 mg/dL — ABNORMAL HIGH (ref 6–20)
CO2: 23 mmol/L (ref 22–32)
Calcium: 8.3 mg/dL — ABNORMAL LOW (ref 8.9–10.3)
Chloride: 104 mmol/L (ref 98–111)
Creatinine, Ser: 0.81 mg/dL (ref 0.44–1.00)
GFR calc Af Amer: >60 mL/min (ref >60)
GFR calc non Af Amer: >60 mL/min (ref >60)
Glucose, Bld: 171 mg/dL — ABNORMAL HIGH (ref 70–99)
Potassium: 3.9 mmol/L (ref 3.5–5.1)
Sodium: 137 mmol/L (ref 135–145)
Total Bilirubin: 0.3 mg/dL (ref 0.3–1.2)
Total Protein: 5.7 g/dL — ABNORMAL LOW (ref 6.5–8.1)

## 2020-07-02 LAB — MAGNESIUM: Magnesium: 2 mg/dL (ref 1.7–2.4)

## 2020-07-02 LAB — C-REACTIVE PROTEIN: CRP: 0.9 mg/dL (ref <1.0)

## 2020-07-02 LAB — CULTURE, BLOOD (ROUTINE X 2)
Culture: NO GROWTH
Culture: NO GROWTH

## 2020-07-02 LAB — GLUCOSE, CAPILLARY
Glucose-Capillary: 184 mg/dL — ABNORMAL HIGH (ref 70–99)
Glucose-Capillary: 245 mg/dL — ABNORMAL HIGH (ref 70–99)

## 2020-07-02 LAB — LACTATE DEHYDROGENASE: LDH: 215 U/L — ABNORMAL HIGH (ref 98–192)

## 2020-07-02 LAB — FERRITIN: Ferritin: 598 ng/mL — ABNORMAL HIGH (ref 11–307)

## 2020-07-02 LAB — PHOSPHORUS: Phosphorus: 3.1 mg/dL (ref 2.5–4.6)

## 2020-07-02 MED ORDER — GLIPIZIDE ER 10 MG PO TB24: 10.0000 mg | ORAL_TABLET | Freq: Every day | ORAL | 0 refills | Status: DC

## 2020-07-02 MED ORDER — DEXAMETHASONE 6 MG PO TABS
6.0000 mg | ORAL_TABLET | Freq: Every day | ORAL | 0 refills | Status: DC
Start: 2020-07-03 — End: 2020-10-11

## 2020-07-02 MED ORDER — RIVAROXABAN 15 MG PO TABS: 15.0000 mg | ORAL_TABLET | Freq: Two times a day (BID) | ORAL | 0 refills | Status: DC

## 2020-07-02 MED ORDER — PANTOPRAZOLE SODIUM 40 MG PO TBEC: 40.0000 mg | DELAYED_RELEASE_TABLET | Freq: Every day | ORAL | 0 refills | Status: DC

## 2020-07-02 MED ORDER — PIOGLITAZONE HCL 45 MG PO TABS: 45.0000 mg | ORAL_TABLET | Freq: Every day | ORAL | 0 refills | Status: DC

## 2020-07-02 MED ORDER — PANTOPRAZOLE SODIUM 40 MG PO TBEC
40.0000 mg | DELAYED_RELEASE_TABLET | Freq: Every day | ORAL | Status: DC
  Administered 2020-07-02: 40 mg via ORAL
  Filled 2020-07-02: qty 1

## 2020-07-02 MED ORDER — METHYLPREDNISOLONE SODIUM SUCC 40 MG IJ SOLR: 40.0000 mg | Freq: Every day | INTRAMUSCULAR | Status: DC

## 2020-07-02 MED ORDER — RIVAROXABAN 20 MG PO TABS: 20.0000 mg | ORAL_TABLET | Freq: Every day | ORAL | 0 refills | Status: DC

## 2020-07-02 MED ORDER — ATORVASTATIN CALCIUM 20 MG PO TABS: 20.0000 mg | ORAL_TABLET | Freq: Every day | ORAL | 0 refills | Status: DC

## 2020-07-02 MED FILL — DEXAMETHASONE 6 MG TABLET: 6 | 5 days supply | Qty: 5 | Fill #0

## 2020-07-02 MED FILL — XARELTO STARTER PACK: 15 & 20 | 30 days supply | Qty: 51 | Fill #0

## 2020-07-02 MED FILL — PIOGLITAZONE HCL 45 MG TAB: 45 | 30 days supply | Qty: 30 | Fill #0

## 2020-07-02 MED FILL — PANTOPRAZOLE SOD DR 40 MG T: 40 | 30 days supply | Qty: 30 | Fill #0

## 2020-07-02 MED FILL — ATORVASTATIN CALCIUM 20 MG: 20 | 30 days supply | Qty: 30 | Fill #0

## 2020-07-02 MED FILL — glipiZIDE ER 10 MG TB24: 10 | 30 days supply | Qty: 30 | Fill #0

## 2020-07-02 NOTE — TOC Transition Note (Signed)
Transition of Care [facility]) - CM/SW Discharge Note   Patient Details  Name: Jill Barnes MRN: [id number] Date of Birth: 27-Dec-1960  Transition of Care [facility]) CM/SW Contact:  Mearl Latin, LCSW Phone Number: 07/02/2020, 11:32 AM   Clinical Narrative:    CSW scheduled patient a hospital follow up at [facility] where they will assist her with an assistance application for Xarelto. MD sending prescriptions to [facility] pharmacy; [facility] letter entered. CSW updated patient. She will contact her family for a ride once she has been discharged.     Final next level of care: Home/Self Care Barriers to Discharge: No Barriers Identified   Patient Goals and CMS Choice        Discharge Placement                       Discharge Plan and Services                                     Social Determinants of Health (SDOH) Interventions     Readmission Risk Interventions No flowsheet data found.

## 2020-07-02 NOTE — Progress Notes (Signed)
SATURATION QUALIFICATIONS: (This note is used to comply with regulatory documentation for home oxygen)  Patient Saturations on Room Air at Rest = 93%  Patient Saturations on Room Air while Ambulating = 93%  Patient Saturations on 0 Liters of oxygen while Ambulating = 93%  Please briefly explain why patient needs home oxygen: Pt not requiring O2 at this time

## 2020-07-02 NOTE — Progress Notes (Signed)
[name] to be D/C'd Home per MD order.  Discussed with the patient and all questions fully answered.  VSS, Skin clean, dry and intact without evidence of skin break down, no evidence of skin tears noted. IV catheter discontinued intact. Site without signs and symptoms of complications. Dressing and pressure applied.  An After Visit Summary was printed and given to the patient. Patient received medications.  D/c education completed with patient including follow up instructions, medication list, d/c activities limitations if indicated, with other d/c instructions as indicated by MD - patient able to verbalize understanding, all questions fully answered.   Patient instructed to return to ED, call 911, or call MD for any changes in condition.   Patient escorted via WC, and D/C home via private auto.  Jill Barnes 07/02/2020 3:55 PM

## 2020-07-02 NOTE — Discharge Summary (Signed)
Jill Barnes, is a 59 y.o. adult  DOB [birth year]  MRN [id number].  Admission date:  06/27/2020  Admitting Physician  Kathrynn Running, MD  Discharge Date:  07/02/2020   Primary MD  Patient, No Pcp Per  Recommendations for primary care physician for things to follow:  -Please check CBC, CMP during next visit. -Please continue counseling about tobacco cessation. -Please adjust diabetic regimen as needed. -Patient will need full anticoagulation for 3 months.   Admission Diagnosis  Pulmonary embolism (HCC) [I26.99] Bilateral pulmonary embolism (HCC) [I26.99] Acute respiratory failure with hypoxia (HCC) [J96.01] Multifocal pneumonia [J18.9]   Discharge Diagnosis  Pulmonary embolism (HCC) [I26.99] Bilateral pulmonary embolism (HCC) [I26.99] Acute respiratory failure with hypoxia (HCC) [J96.01] Multifocal pneumonia [J18.9]   Principal Problem:   Bilateral pulmonary embolism (HCC) Active Problems:   Nicotine dependence   Acute respiratory failure with hypoxia (HCC)   Acute hypoxemic respiratory failure due to COVID-19 [facility])   Type 2 diabetes mellitus without complication (HCC)   Respiratory failure (HCC)   Pulmonary embolism (HCC)   Obesity (BMI 30-39.9)   Acute deep vein thrombosis (DVT) of calf muscle vein of right lower extremity (HCC)   Pneumonia due to COVID-19 virus   Tobacco abuse   Diabetes mellitus type 2, uncontrolled, with complications (HCC)   Acute diastolic CHF (congestive heart failure) (HCC)      Past Medical History:  Diagnosis Date  . COPD (chronic obstructive pulmonary disease) (HCC)   . Diabetes mellitus without complication (HCC)   . Kidney stone     Past Surgical History:  Procedure Laterality Date  . ABDOMINAL HYSTERECTOMY    . BLADDER SURGERY         History of present illness and  Hospital Course:     Kindly see H&P for history of present illness and  admission details, please review complete Labs, Consult reports and Test reports for all details in brief  HPI  from the history and physical done on the day of admission 06/27/2020  HPI: Jill Barnes is a 59 y.o. adult with medical history significant for dm, copd, and recent covid hospitalization, who presents with the above.  Covid positive 7/26, had symptoms for a couple days prior. Presented to this hospital 7/30 with dyspnea. Found to be hypoxic with covid pneumonia. Treated with o2, steroids, and remdesivir. Completed course of remdisivir. Discharged 8/2. Notes had steady improvement in breathing symptoms since start of covid treatment. However for past 2 days has had worsening shortness of breath. Occasional cough. Some tightness in chest when takes deep breath. No fevers. No n/v/d. No leg swelling. No hx of dvt/PE. Was not vaccinated.   Hospital Course    Acute hypoxic respiratory failure due to acute bilateral PE and COVID-19 pneumonia . -CTA on admission significant for bilateral PE and multifocal opacity due to pneumonia . -She was requiring oxygen initially, but over last 24 hours with no oxygen requirement, ambulated in the hallway today with no hypoxia saturating 93% on room air with activity . -  Resolved . -COVID-19 pneumonia treated with IV remdesivir during hospital stay, IV steroids, to finish another 5 days of oral Decadron as an outpatient .  COPD/Tobacco abuse -She was counseled  Acute bilateral PE/ACUTE RIGHT DVT -Most likely provoked secondary to her Covid infection.  Tobacco abuse also exacerbating factor. -8/7 lower extremity Doppler; multivein DVT RIGHT, see Doppler below -He was treated initially with heparin drip, currently transitioned to Xarelto, she will be given [employer] letter on discharge for next 30-day supply, and then after that she can use 30 days Xarelto card, and she will be following with [facility] will try to assist with her  anticoagulation. -Need to be anticoagulated for 3 to 6 months   Chronic diastolic CHF -No evidence of volume overload on discharge   DM type II uncontrolled with complication -7/30 hemoglobin A1c= 7.3 -Resume home medications on discharge  Obesity BMI 30-39.9 -see diabetes  CAD -As evidenced on imaging, continue with statin on discharge, start on aspirin after she finished her Xarelto treatment course. -Further work-up as an outpatient  Discharge Condition:  Stable   Follow UP   Follow-up Information    [facility]. Go on 07/23/2020.   Why: Hospital Follow up at 3:50pm with [facility] They can assist you with an assitance application for Xarelto.  Contact information: [address] [place] [address] [phone]                Discharge Instructions  and  Discharge Medications     Discharge Instructions    Discharge instructions   Complete by: As directed    Follow with [facility]  Get CBC, CMP, 2 checked  by Primary MD next visit.    Activity: As tolerated with Full fall precautions use walker/cane & assistance as needed   Disposition Home    Diet: Heart Healthy /carb modified , with feeding assistance and aspiration precautions.  For Heart failure patients - Check your Weight same time everyday, if you gain over 2 pounds, or you develop in leg swelling, experience more shortness of breath or chest pain, call your Primary MD immediately. Follow Cardiac Low Salt Diet and 1.5 lit/day fluid restriction.   On your next visit with your primary care physician please Get Medicines reviewed and adjusted.   Please request your Prim.MD to go over all Hospital Tests and Procedure/Radiological results at the follow up, please get all Hospital records sent to your Prim MD by signing hospital release before you go home.   If you experience worsening of your admission symptoms, develop  shortness of breath, life threatening emergency, suicidal or homicidal thoughts you must seek medical attention immediately by calling 911 or calling your MD immediately  if symptoms less severe.  You Must read complete instructions/literature along with all the possible adverse reactions/side effects for all the Medicines you take and that have been prescribed to you. Take any new Medicines after you have completely understood and accpet all the possible adverse reactions/side effects.   Do not drive, operating heavy machinery, perform activities at heights, swimming or participation in water activities or provide baby sitting services if your were admitted for syncope or siezures until you have seen by Primary MD or a Neurologist and advised to do so again.  Do not drive when taking Pain medications.    Do not take more than prescribed Pain, Sleep and Anxiety Medications  Special Instructions: If you have smoked or chewed Tobacco  in  the last 2 yrs please stop smoking, stop any regular Alcohol  and or any Recreational drug use.  Wear Seat belts while driving.   Please note  You were cared for by a hospitalist during your hospital stay. If you have any questions about your discharge medications or the care you received while you were in the hospital after you are discharged, you can call the unit and asked to speak with the hospitalist on call if the hospitalist that took care of you is not available. Once you are discharged, your primary care physician will handle any further medical issues. Please note that NO REFILLS for any discharge medications will be authorized once you are discharged, as it is imperative that you return to your primary care physician (or establish a relationship with a primary care physician if you do not have one) for your aftercare needs so that they can reassess your need for medications and monitor your lab values.   Increase activity slowly   Complete by: As  directed      Allergies as of 07/02/2020      Reactions   Sulfa Antibiotics Shortness Of Breath, Swelling   All-over swelling   Penicillins Rash      Medication List    STOP taking these medications   ibuprofen 200 MG tablet Commonly known as: ADVIL     TAKE these medications   albuterol 108 (90 Base) MCG/ACT inhaler Commonly known as: VENTOLIN HFA Inhale 2 puffs into the lungs every 6 (six) hours as needed for wheezing or shortness of breath.   atorvastatin 20 MG tablet Commonly known as: LIPITOR Take 1 tablet (20 mg total) by mouth daily.   glipiZIDE 10 MG 24 hr tablet Commonly known as: GLUCOTROL XL Take 1 tablet (10 mg total) by mouth daily with breakfast.   guaiFENesin-dextromethorphan 100-10 MG/5ML syrup Commonly known as: ROBITUSSIN DM Take 10 mLs by mouth every 6 (six) hours as needed for cough.   NovoLIN 70/30 ReliOn (70-30) 100 UNIT/ML injection Generic drug: insulin NPH-regular Human Inject 15 Units into the skin in the morning and at bedtime.   pantoprazole 40 MG tablet Commonly known as: PROTONIX Take 1 tablet (40 mg total) by mouth daily. Start taking on: July 03, 2020   pioglitazone 45 MG tablet Commonly known as: ACTOS Take 1 tablet (45 mg total) by mouth daily.   Rivaroxaban 15 MG Tabs tablet Commonly known as: XARELTO Take 1 tablet (15 mg total) by mouth 2 (two) times daily with a meal.   rivaroxaban 20 MG Tabs tablet Commonly known as: XARELTO Take 1 tablet (20 mg total) by mouth daily with supper. Start taking on: July 22, 2020         Diet and Activity recommendation: See Discharge Instructions above   Consults obtained -  None   Major procedures and Radiology Reports - PLEASE review detailed and final reports for all details, in brief -      CT Angio Chest PE W and/or Wo Contrast  Result Date: 06/27/2020 CLINICAL DATA:  PE suspected, recent COVID-19 positivity EXAM: CT ANGIOGRAPHY CHEST WITH CONTRAST TECHNIQUE:  Multidetector CT imaging of the chest was performed using the standard protocol during bolus administration of intravenous contrast. Multiplanar CT image reconstructions and MIPs were obtained to evaluate the vascular anatomy. CONTRAST:  100mL OMNIPAQUE IOHEXOL 350 MG/ML SOLN COMPARISON:  Radiograph 06/27/2020 FINDINGS: Cardiovascular: Evaluation of the pulmonary arteries is limited by suboptimal contrast opacification (180 HU within the pulmonary trunk) and extensive  respiratory motion artifact throughout the lungs most pronounced towards the lung bases. There is visible hypodense filling defect extending from the distal right inter lobar pulmonary artery into the right lower lobar and superior segmental pulmonary arteries in the in the right lower lobe (12/108). Some additional left pulmonary arterial filling defect is seen extending from the distal left main pulmonary artery into the lobar and segmental branches of the left upper lobe (12/91-80). Possible filling defect in the inferior lingular segmental artery (12/133). Central pulmonary arteries are normal caliber. No elevation of the RV/LV ratio (0.7). The heart is borderline enlarged. Three-vessel coronary artery disease is noted. Mediastinum/Nodes: No mediastinal fluid or gas. Normal thyroid gland and thoracic inlet. No acute abnormality of the trachea or esophagus. Scattered subcentimeter low-attenuation mediastinal and hilar adenopathy. Additional borderline enlarged 11 mm left hilar node. Lungs/Pleura: There are patchy multifocal areas of mixed consolidation and ground-glass within the lungs. However, there is a region suggestive of an atoll sign in the inferior lingula corresponding to the distribution of 1 of the suspected pulmonary artery emboli which could reflect a region of wedge pulmonary infarct. Hypoventilatory changes are noted as well likely accentuated by imaging during exhalation. No pneumothorax or effusion. Upper Abdomen: No acute  abnormalities present in the visualized portions of the upper abdomen. Musculoskeletal: Multilevel degenerative changes are present in the imaged portions of the spine. No acute osseous abnormality or suspicious osseous lesion. Review of the MIP images confirms the above findings. IMPRESSION: 1. Evaluation of the pulmonary arteries is limited by suboptimal contrast opacification and extensive respiratory motion artifact throughout the lungs most pronounced towards the lung bases. Pulmonary artery emboli extend from the right intralobar pulmonary artery into the lower lobar and superior segmental pulmonary arteries. Additional filling defects in the distal left main pulmonary artery extending into the left upper lobar and apical segmental branches as well as within the inferior lingular segmental branch. 2. No evidence of right heart strain. 3. Patchy multifocal areas of mixed consolidation and ground-glass within the lungs, compatible with pneumonia given recent COVID-19 positivity. 4. Peripheral consolidation with central ground-glass compatible with an atoll sign in the inferior lingula in the distribution of a pulmonary embolus could reflect a developing pulmonary infarct. 5. Scattered low-attenuation subcentimeter nodes with a borderline enlarged AP window lymph node, likely reactive. 6. Three-vessel coronary artery disease. 7. Aortic Atherosclerosis (ICD10-I70.0). Critical Value/emergent results were called by telephone at the time of interpretation on 06/27/2020 at 9:00 pm to provider JULIE HAVILAND , who verbally acknowledged these results. Electronically Signed   By: Kreg Shropshire M.D.   On: 06/27/2020 21:00   DG Chest Portable 1 View  Result Date: 06/27/2020 CLINICAL DATA:  History of COVID-19 positivity recently with increasing shortness of breath EXAM: PORTABLE CHEST 1 VIEW COMPARISON:  06/19/2020 FINDINGS: Cardiac shadow is stable. The lungs are well aerated bilaterally. Patchy opacity is noted in the  bases bilaterally worse on the left than the right consistent with multifocal pneumonia. This may be sequelae from the prior COVID-19 infection. No bony abnormality is noted. IMPRESSION: Bibasilar opacities left greater than right consistent with multifocal pneumonia. This is likely sequelae from the patient's prior COVID-19 positivity. Electronically Signed   By: Alcide Clever M.D.   On: 06/27/2020 12:15   DG Chest Port 1 View  Result Date: 06/19/2020 CLINICAL DATA:  Shortness of breath.  COVID-19 positive. EXAM: PORTABLE CHEST 1 VIEW COMPARISON:  June 16, 2020. FINDINGS: The heart size and mediastinal contours are within normal limits.  No pneumothorax or pleural effusion is noted. Possible right basilar opacity is noted concerning for pneumonia. Multiple airspace opacities are noted in the left lung concerning for multifocal pneumonia. The visualized skeletal structures are unremarkable. IMPRESSION: Findings consistent with bilateral and multifocal pneumonia, left greater than right. Electronically Signed   By: Lupita Raider M.D.   On: 06/19/2020 15:38   DG Chest Port 1 View  Result Date: 06/16/2020 CLINICAL DATA:  Cough EXAM: PORTABLE CHEST 1 VIEW COMPARISON:  11/01/2019 chest radiograph. FINDINGS: No focal airspace opacity. Mild peribronchial thickening, unchanged. No pneumothorax or pleural effusion. Cardiomediastinal silhouette is within normal limits. No acute osseous abnormality. IMPRESSION: No focal airspace opacity. Mild peribronchial thickening is unchanged and may reflect sequela of bronchitis. Electronically Signed   By: Stana Bunting M.D.   On: 06/16/2020 13:31   ECHOCARDIOGRAM COMPLETE  Result Date: 06/28/2020    ECHOCARDIOGRAM REPORT   Patient Name:   Jill Barnes Date of Exam: 06/28/2020 Medical Rec #:  [id number]        Height:       66.0 in Accession #:    [id number]       Weight:       230.0 lb Date of Birth:  1961/02/05        BSA:          2.122 m Patient Age:    59 years          BP:           119/67 mmHg Patient Gender: F                HR:           82 bpm. Exam Location:  Inpatient Procedure: 2D Echo, Cardiac Doppler and Color Doppler Indications:    Pulmonary Embolus 415.19 / I26.99  History:        Patient has prior history of Echocardiogram examinations, most                 recent 12/16/2017. COPD; Risk Factors:Dyslipidemia. Tobacco                 abuse,Acute hypoxemic respiratory failure due to COVID-19,Acute                 deep vein thrombosis (DVT) of calf muscle vein of right lower                 extremity (HCC).  Sonographer:    Celesta Gentile RCS Referring Phys: [id number] CURTIS J WOODS IMPRESSIONS  1. Left ventricular ejection fraction, by estimation, is 60 to 65%. The left ventricle has normal function. The left ventricle has no regional wall motion abnormalities. There is mild concentric left ventricular hypertrophy. Left ventricular diastolic parameters are consistent with Grade I diastolic dysfunction (impaired relaxation). Elevated left ventricular end-diastolic pressure.  2. Right ventricular systolic function is normal. The right ventricular size is normal. There is normal pulmonary artery systolic pressure.  3. The mitral valve is normal in structure. Trivial mitral valve regurgitation. No evidence of mitral stenosis.  4. The aortic valve has an indeterminant number of cusps. Aortic valve regurgitation is not visualized. Mild aortic valve sclerosis is present, with no evidence of aortic valve stenosis.  5. The inferior vena cava is normal in size with greater than 50% respiratory variability, suggesting right atrial pressure of 3 mmHg. FINDINGS  Left Ventricle: Left ventricular ejection fraction, by estimation, is 60 to 65%. The left ventricle has normal function.  The left ventricle has no regional wall motion abnormalities. The left ventricular internal cavity size was normal in size. There is  mild concentric left ventricular hypertrophy. Left ventricular diastolic  parameters are consistent with Grade I diastolic dysfunction (impaired relaxation). Elevated left ventricular end-diastolic pressure. Right Ventricle: The right ventricular size is normal. No increase in right ventricular wall thickness. Right ventricular systolic function is normal. There is normal pulmonary artery systolic pressure. The tricuspid regurgitant velocity is 2.13 m/s, and  with an assumed right atrial pressure of 3 mmHg, the estimated right ventricular systolic pressure is 21.1 mmHg. Left Atrium: Left atrial size was normal in size. Right Atrium: Right atrial size was normal in size. Pericardium: There is no evidence of pericardial effusion. Mitral Valve: The mitral valve is normal in structure. Normal mobility of the mitral valve leaflets. Trivial mitral valve regurgitation. No evidence of mitral valve stenosis. Tricuspid Valve: The tricuspid valve is normal in structure. Tricuspid valve regurgitation is trivial. No evidence of tricuspid stenosis. Aortic Valve: The aortic valve has an indeterminant number of cusps. Aortic valve regurgitation is not visualized. Mild aortic valve sclerosis is present, with no evidence of aortic valve stenosis. Pulmonic Valve: The pulmonic valve was normal in structure. Pulmonic valve regurgitation is not visualized. No evidence of pulmonic stenosis. Aorta: The aortic root is normal in size and structure. Venous: The inferior vena cava is normal in size with greater than 50% respiratory variability, suggesting right atrial pressure of 3 mmHg. IAS/Shunts: The interatrial septum appears to be lipomatous. No atrial level shunt detected by color flow Doppler.  LEFT VENTRICLE PLAX 2D LVIDd:         3.90 cm  Diastology LVIDs:         2.60 cm  LV e' lateral:   5.55 cm/s LV PW:         1.10 cm  LV E/e' lateral: 19.3 LV IVS:        1.20 cm  LV e' medial:    7.51 cm/s LVOT diam:     1.60 cm  LV E/e' medial:  14.2 LV SV:         56 LV SV Index:   26 LVOT Area:     2.01 cm  RIGHT  VENTRICLE RV S prime:     15.70 cm/s TAPSE (M-mode): 2.2 cm LEFT ATRIUM             Index       RIGHT ATRIUM           Index LA diam:        2.60 cm 1.23 cm/m  RA Area:     11.40 cm LA Vol (A2C):   50.7 ml 23.87 ml/m RA Volume:   24.30 ml  11.45 ml/m LA Vol (A4C):   58.9 ml 27.75 ml/m LA Biplane Vol: 56.2 ml 26.48 ml/m  AORTIC VALVE LVOT Vmax:   105.00 cm/s LVOT Vmean:  66.200 cm/s LVOT VTI:    0.277 m  AORTA Ao Root diam: 3.10 cm MITRAL VALVE                TRICUSPID VALVE MV Area (PHT): 2.04 cm     TR Peak grad:   18.1 mmHg MV Decel Time: 371 msec     TR Vmax:        213.00 cm/s MV E velocity: 107.00 cm/s MV A velocity: 149.00 cm/s  SHUNTS MV E/A ratio:  0.72         Systemic  VTI:  0.28 m                             Systemic Diam: 1.60 cm Armanda Magic MD Electronically signed by Armanda Magic MD Signature Date/Time: 06/28/2020/4:49:07 PM    Final    VAS [place] LOWER EXTREMITY VENOUS (DVT)  Result Date: 06/28/2020  Lower Venous DVTStudy Indications: Covid-19, and pulmonary embolism.  Comparison Study: No prior study on file Performing Technologist: Sherren Kerns RVS  Examination Guidelines: A complete evaluation includes B-mode imaging, spectral Doppler, color Doppler, and power Doppler as needed of all accessible portions of each vessel. Bilateral testing is considered an integral part of a complete examination. Limited examinations for reoccurring indications may be performed as noted. The reflux portion of the exam is performed with the patient in reverse Trendelenburg.  +---------+---------------+---------+-----------+----------+-----------------+ RIGHT    CompressibilityPhasicitySpontaneityPropertiesThrombus Aging    +---------+---------------+---------+-----------+----------+-----------------+ CFV      Full           Yes      Yes                                    +---------+---------------+---------+-----------+----------+-----------------+ SFJ      Full                                                            +---------+---------------+---------+-----------+----------+-----------------+ FV Prox  Full                                                           +---------+---------------+---------+-----------+----------+-----------------+ FV Mid   Full                                                           +---------+---------------+---------+-----------+----------+-----------------+ FV DistalFull                                                           +---------+---------------+---------+-----------+----------+-----------------+ PFV      Full                                                           +---------+---------------+---------+-----------+----------+-----------------+ POP      Full           No       No                   Age Indeterminate +---------+---------------+---------+-----------+----------+-----------------+ PTV      Full  Age Indeterminate +---------+---------------+---------+-----------+----------+-----------------+ PERO     Full                                         Age Indeterminate +---------+---------------+---------+-----------+----------+-----------------+ Gastroc  Full           No       No                   Age Indeterminate +---------+---------------+---------+-----------+----------+-----------------+ popliteal, gastroc, posterior tibial, and peroneal veins all compress but are dilated and do not have color or Doppler flow present.  +---------+---------------+---------+-----------+----------+--------------+ LEFT     CompressibilityPhasicitySpontaneityPropertiesThrombus Aging +---------+---------------+---------+-----------+----------+--------------+ CFV      Full           Yes                                          +---------+---------------+---------+-----------+----------+--------------+ SFJ      Full                                                         +---------+---------------+---------+-----------+----------+--------------+ FV Prox  Full                                                        +---------+---------------+---------+-----------+----------+--------------+ FV Mid   Full                                                        +---------+---------------+---------+-----------+----------+--------------+ FV DistalFull                                                        +---------+---------------+---------+-----------+----------+--------------+ PFV      Full                                                        +---------+---------------+---------+-----------+----------+--------------+ POP      Full           No       No                                  +---------+---------------+---------+-----------+----------+--------------+ PTV      Full                                                        +---------+---------------+---------+-----------+----------+--------------+  PERO     Full                                                        +---------+---------------+---------+-----------+----------+--------------+ Gastroc  Full                                                        +---------+---------------+---------+-----------+----------+--------------+    Summary: RIGHT: - Findings consistent with age indeterminate deep vein thrombosis involving the right popliteal vein, right posterior tibial veins, right peroneal veins, and right gastrocnemius veins.  LEFT: - There is no evidence of deep vein thrombosis in the lower extremity.  *See table(s) above for measurements and observations. Electronically signed by Coral Else MD on 06/28/2020 at 5:50:24 PM.    Final     Micro Results    Recent Results (from the past 240 hour(s))  Blood culture (routine x 2)     Status: None   Collection Time: 06/27/20  3:46 PM   Specimen: BLOOD RIGHT HAND  Result Value Ref Range Status   Specimen  Description BLOOD RIGHT HAND  Final   Special Requests   Final    BOTTLES DRAWN AEROBIC ONLY Blood Culture results may not be optimal due to an inadequate volume of blood received in culture bottles   Culture   Final    NO GROWTH 5 DAYS Performed at [facility], [address] [place], [place] [address]    Report Status 07/02/2020 FINAL  Final  Blood culture (routine x 2)     Status: None   Collection Time: 06/27/20  3:50 PM   Specimen: BLOOD  Result Value Ref Range Status   Specimen Description BLOOD LEFT ANTECUBITAL  Final   Special Requests   Final    BOTTLES DRAWN AEROBIC AND ANAEROBIC Blood Culture results may not be optimal due to an inadequate volume of blood received in culture bottles   Culture   Final    NO GROWTH 5 DAYS Performed at [facility], [address], [place], [place] [address]    Report Status 07/02/2020 FINAL  Final  SARS Coronavirus 2 by RT PCR (hospital order, performed in [facility] hospital lab) Nasopharyngeal Nasopharyngeal Swab     Status: Abnormal   Collection Time: 06/27/20  6:01 PM   Specimen: Nasopharyngeal Swab  Result Value Ref Range Status   SARS Coronavirus 2 POSITIVE (A) NEGATIVE Final    Comment: RESULT CALLED TO, READ BACK BY AND VERIFIED WITH: Tally Due RN 06/27/20 1925 JDW (NOTE) SARS-CoV-2 target nucleic acids are DETECTED  SARS-CoV-2 RNA is generally detectable in upper respiratory specimens  during the acute phase of infection.  Positive results are indicative  of the presence of the identified virus, but do not rule out bacterial infection or co-infection with other pathogens not detected by the test.  Clinical correlation with patient history and  other diagnostic information is necessary to determine patient infection status.  The expected result is negative.  Fact Sheet for Patients:   BoilerBrush.com.cy   Fact Sheet for Healthcare Providers:   https://pope.com/     This test is not yet approved or cleared  by the [place] and  has been authorized for detection and/or diagnosis of SARS-CoV-2 by FDA under an Emergency Use Authorization (EUA).  This EUA will remain in effect (meaning this test can b e used) for the duration of  the COVID-19 declaration under Section 564(b)(1) of the Act, 21 U.S.C. section 360-bbb-3(b)(1), unless the authorization is terminated or revoked sooner.  Performed at [facility], [address] [place], [place] [address]        Today   Subjective:   Jill Barnes today has no headache,no chest abdominal pain,no new weakness tingling or numbness, feels much better wants to go home today.  She ambulated in the hallway with no hypoxia on room air.  Objective:   Blood pressure (!) 159/85, pulse 69, temperature 98.2 F (36.8 C), temperature source Oral, resp. rate 19, height  (1.676 m), weight 99.2 kg, SpO2 90 %.   Intake/Output Summary (Last 24 hours) at 07/02/2020 1141 Last data filed at 07/02/2020 0907 Gross per 24 hour  Intake 480 ml  Output --  Net 480 ml    Exam Awake Alert, Oriented x 3, No new F.N deficits, Normal affect Symmetrical Chest wall movement, Good air movement bilaterally, CTAB RRR,No Gallops,Rubs or new Murmurs, No Parasternal Heave +ve B.Sounds, Abd Soft, Non tender, No organomegaly appriciated, No rebound -guarding or rigidity. No Cyanosis, Clubbing or edema, No new Rash or bruise  Data Review   CBC w Diff:  Lab Results  Component Value Date   WBC 13.7 (H) 07/02/2020   HGB 13.9 07/02/2020   HCT 41.9 07/02/2020   PLT 287 07/02/2020   LYMPHOPCT 23 07/02/2020   MONOPCT 8 07/02/2020   EOSPCT 0 07/02/2020   BASOPCT 0 07/02/2020    CMP:  Lab Results  Component Value Date   NA 137 07/02/2020   K 3.9 07/02/2020   CL 104 07/02/2020   CO2 23 07/02/2020   BUN 25 (H) 07/02/2020   CREATININE 0.81 07/02/2020   PROT 5.7 (L) 07/02/2020   ALBUMIN 2.1 (L)  07/02/2020   BILITOT 0.3 07/02/2020   ALKPHOS 80 07/02/2020   AST 19 07/02/2020   ALT 24 07/02/2020  .   Total Time in preparing paper work, data evaluation and todays exam - 35 minutes  Huey Bienenstock M.D on 07/02/2020 at 11:41 AM  [facility]   Office  [phone]

## 2020-07-23 ENCOUNTER — Ambulatory Visit: Payer: Self-pay | Attending: Physician Assistant | Admitting: Physician Assistant

## 2020-10-11 ENCOUNTER — Emergency Department (HOSPITAL_BASED_OUTPATIENT_CLINIC_OR_DEPARTMENT_OTHER): Payer: Self-pay

## 2020-10-11 ENCOUNTER — Encounter (HOSPITAL_BASED_OUTPATIENT_CLINIC_OR_DEPARTMENT_OTHER): Payer: Self-pay | Admitting: Emergency Medicine

## 2020-10-11 ENCOUNTER — Inpatient Hospital Stay (HOSPITAL_BASED_OUTPATIENT_CLINIC_OR_DEPARTMENT_OTHER)
Admission: EM | Admit: 2020-10-11 | Discharge: 2020-10-13 | DRG: 066 | Disposition: A | Discharge status: Home / Self Care | Payer: Self-pay | Attending: Internal Medicine | Admitting: Internal Medicine

## 2020-10-11 ENCOUNTER — Other Ambulatory Visit: Payer: Self-pay

## 2020-10-11 DIAGNOSIS — E1165 Type 2 diabetes mellitus with hyperglycemia: Secondary | ICD-10-CM | POA: Diagnosis present

## 2020-10-11 DIAGNOSIS — I63411 Cerebral infarction due to embolism of right middle cerebral artery: Principal | ICD-10-CM | POA: Diagnosis present

## 2020-10-11 DIAGNOSIS — Z86711 Personal history of pulmonary embolism: Secondary | ICD-10-CM

## 2020-10-11 DIAGNOSIS — I6389 Other cerebral infarction: Secondary | ICD-10-CM | POA: Diagnosis present

## 2020-10-11 DIAGNOSIS — Z6835 Body mass index (BMI) 35.0-35.9, adult: Secondary | ICD-10-CM

## 2020-10-11 DIAGNOSIS — Z882 Allergy status to sulfonamides status: Secondary | ICD-10-CM

## 2020-10-11 DIAGNOSIS — Z20822 Contact with and (suspected) exposure to covid-19: Secondary | ICD-10-CM | POA: Diagnosis present

## 2020-10-11 DIAGNOSIS — I10 Essential (primary) hypertension: Secondary | ICD-10-CM | POA: Diagnosis present

## 2020-10-11 DIAGNOSIS — Z79899 Other long term (current) drug therapy: Secondary | ICD-10-CM

## 2020-10-11 DIAGNOSIS — E669 Obesity, unspecified: Secondary | ICD-10-CM | POA: Diagnosis present

## 2020-10-11 DIAGNOSIS — Z880 Allergy status to penicillin: Secondary | ICD-10-CM

## 2020-10-11 DIAGNOSIS — F17210 Nicotine dependence, cigarettes, uncomplicated: Secondary | ICD-10-CM | POA: Diagnosis present

## 2020-10-11 DIAGNOSIS — Z9114 Patient's other noncompliance with medication regimen: Secondary | ICD-10-CM

## 2020-10-11 DIAGNOSIS — Z8249 Family history of ischemic heart disease and other diseases of the circulatory system: Secondary | ICD-10-CM

## 2020-10-11 DIAGNOSIS — Z8616 Personal history of COVID-19: Secondary | ICD-10-CM

## 2020-10-11 DIAGNOSIS — I63511 Cerebral infarction due to unspecified occlusion or stenosis of right middle cerebral artery: Secondary | ICD-10-CM

## 2020-10-11 DIAGNOSIS — R2689 Other abnormalities of gait and mobility: Secondary | ICD-10-CM | POA: Diagnosis present

## 2020-10-11 DIAGNOSIS — E876 Hypokalemia: Secondary | ICD-10-CM | POA: Diagnosis present

## 2020-10-11 DIAGNOSIS — E785 Hyperlipidemia, unspecified: Secondary | ICD-10-CM | POA: Diagnosis present

## 2020-10-11 DIAGNOSIS — Z7984 Long term (current) use of oral hypoglycemic drugs: Secondary | ICD-10-CM

## 2020-10-11 DIAGNOSIS — I6523 Occlusion and stenosis of bilateral carotid arteries: Secondary | ICD-10-CM | POA: Diagnosis present

## 2020-10-11 DIAGNOSIS — Z7902 Long term (current) use of antithrombotics/antiplatelets: Secondary | ICD-10-CM

## 2020-10-11 DIAGNOSIS — Z794 Long term (current) use of insulin: Secondary | ICD-10-CM

## 2020-10-11 DIAGNOSIS — J449 Chronic obstructive pulmonary disease, unspecified: Secondary | ICD-10-CM | POA: Diagnosis present

## 2020-10-11 DIAGNOSIS — I639 Cerebral infarction, unspecified: Secondary | ICD-10-CM | POA: Diagnosis present

## 2020-10-11 LAB — HEPATIC FUNCTION PANEL
ALT: 21 U/L (ref 0–44)
AST: 16 U/L (ref 15–41)
Albumin: 3.6 g/dL (ref 3.5–5.0)
Alkaline Phosphatase: 103 U/L (ref 38–126)
Bilirubin, Direct: 0.1 mg/dL (ref 0.0–0.2)
Indirect Bilirubin: 0.1 mg/dL — ABNORMAL LOW (ref 0.3–0.9)
Total Bilirubin: 0.2 mg/dL — ABNORMAL LOW (ref 0.3–1.2)
Total Protein: 7.4 g/dL (ref 6.5–8.1)

## 2020-10-11 LAB — URINALYSIS, MICROSCOPIC (REFLEX)

## 2020-10-11 LAB — CBC WITH DIFFERENTIAL/PLATELET
Abs Immature Granulocytes: 0.04 10*3/uL (ref 0.00–0.07)
Basophils Absolute: 0.1 10*3/uL (ref 0.0–0.1)
Basophils Relative: 1 %
Eosinophils Absolute: 0.3 10*3/uL (ref 0.0–0.5)
Eosinophils Relative: 3 %
HCT: 49.2 % — ABNORMAL HIGH (ref 36.0–46.0)
Hemoglobin: 16.1 g/dL — ABNORMAL HIGH (ref 12.0–15.0)
Immature Granulocytes: 0 %
Lymphocytes Relative: 29 %
Lymphs Abs: 2.7 10*3/uL (ref 0.7–4.0)
MCH: 30 pg (ref 26.0–34.0)
MCHC: 32.7 g/dL (ref 30.0–36.0)
MCV: 91.8 fL (ref 80.0–100.0)
Monocytes Absolute: 0.9 10*3/uL (ref 0.1–1.0)
Monocytes Relative: 9 %
Neutro Abs: 5.3 10*3/uL (ref 1.7–7.7)
Neutrophils Relative %: 58 %
Platelets: 278 10*3/uL (ref 150–400)
RBC: 5.36 MIL/uL — ABNORMAL HIGH (ref 3.87–5.11)
RDW: 13.3 % (ref 11.5–15.5)
WBC: 9.3 10*3/uL (ref 4.0–10.5)
nRBC: 0 % (ref 0.0–0.2)

## 2020-10-11 LAB — RESP PANEL BY RT-PCR (FLU A&B, COVID) ARPGX2
Influenza A by PCR: NEGATIVE
Influenza B by PCR: NEGATIVE
SARS Coronavirus 2 by RT PCR: NEGATIVE

## 2020-10-11 LAB — I-STAT VENOUS BLOOD GAS, ED
Acid-Base Excess: 2 mmol/L (ref 0.0–2.0)
Bicarbonate: 28.7 mmol/L — ABNORMAL HIGH (ref 20.0–28.0)
Calcium, Ion: 1.23 mmol/L (ref 1.15–1.40)
HCT: 49 % — ABNORMAL HIGH (ref 36.0–46.0)
Hemoglobin: 16.7 g/dL — ABNORMAL HIGH (ref 12.0–15.0)
O2 Saturation: 38 %
Patient temperature: 98
Potassium: 3.4 mmol/L — ABNORMAL LOW (ref 3.5–5.1)
Sodium: 140 mmol/L (ref 135–145)
TCO2: 30 mmol/L (ref 22–32)
pCO2, Ven: 48.7 mmHg (ref 44.0–60.0)
pH, Ven: 7.376 (ref 7.250–7.430)
pO2, Ven: 23 mmHg — CL (ref 32.0–45.0)

## 2020-10-11 LAB — URINALYSIS, ROUTINE W REFLEX MICROSCOPIC
Bilirubin Urine: NEGATIVE
Glucose, UA: NEGATIVE mg/dL
Ketones, ur: NEGATIVE mg/dL
Leukocytes,Ua: NEGATIVE
Nitrite: NEGATIVE
Protein, ur: NEGATIVE mg/dL
Specific Gravity, Urine: 1.01 (ref 1.005–1.030)
pH: 6.5 (ref 5.0–8.0)

## 2020-10-11 LAB — CBG MONITORING, ED: Glucose-Capillary: 228 mg/dL — ABNORMAL HIGH (ref 70–99)

## 2020-10-11 LAB — BASIC METABOLIC PANEL
Anion gap: 10 (ref 5–15)
BUN: 16 mg/dL (ref 6–20)
CO2: 25 mmol/L (ref 22–32)
Calcium: 8.9 mg/dL (ref 8.9–10.3)
Chloride: 102 mmol/L (ref 98–111)
Creatinine, Ser: 0.98 mg/dL (ref 0.44–1.00)
GFR, Estimated: >60 mL/min (ref >60)
Glucose, Bld: 184 mg/dL — ABNORMAL HIGH (ref 70–99)
Potassium: 3.5 mmol/L (ref 3.5–5.1)
Sodium: 137 mmol/L (ref 135–145)

## 2020-10-11 LAB — GLUCOSE, CAPILLARY: Glucose-Capillary: 207 mg/dL — ABNORMAL HIGH (ref 70–99)

## 2020-10-11 LAB — MAGNESIUM: Magnesium: 1.9 mg/dL (ref 1.7–2.4)

## 2020-10-11 LAB — PROTIME-INR
INR: 1 (ref 0.8–1.2)
Prothrombin Time: 12.4 seconds (ref 11.4–15.2)

## 2020-10-11 LAB — SEDIMENTATION RATE: Sed Rate: 10 mm/hr (ref 0–22)

## 2020-10-11 MED ORDER — ENOXAPARIN SODIUM 40 MG/0.4ML ~~LOC~~ SOLN
40.0000 mg | SUBCUTANEOUS | Status: DC
  Administered 2020-10-11 – 2020-10-12 (×2): 40 mg via SUBCUTANEOUS
  Filled 2020-10-11 (×2): qty 0.4

## 2020-10-11 MED ORDER — PANTOPRAZOLE SODIUM 40 MG PO TBEC
40.0000 mg | DELAYED_RELEASE_TABLET | Freq: Every day | ORAL | Status: DC
  Administered 2020-10-11 – 2020-10-13 (×3): 40 mg via ORAL
  Filled 2020-10-11 (×3): qty 1

## 2020-10-11 MED ORDER — ACETAMINOPHEN 160 MG/5ML PO SOLN: 650.0000 mg | ORAL | Status: DC | PRN

## 2020-10-11 MED ORDER — METOCLOPRAMIDE HCL 5 MG/ML IJ SOLN
10.0000 mg | Freq: Four times a day (QID) | INTRAMUSCULAR | Status: DC | PRN
  Administered 2020-10-11 – 2020-10-12 (×2): 10 mg via INTRAVENOUS
  Filled 2020-10-11 (×2): qty 2

## 2020-10-11 MED ORDER — ASPIRIN EC 81 MG PO TBEC
81.0000 mg | DELAYED_RELEASE_TABLET | Freq: Every day | ORAL | Status: DC
  Administered 2020-10-11 – 2020-10-13 (×3): 81 mg via ORAL
  Filled 2020-10-11 (×3): qty 1

## 2020-10-11 MED ORDER — DIPHENHYDRAMINE HCL 25 MG PO CAPS: 25.0000 mg | ORAL_CAPSULE | Freq: Four times a day (QID) | ORAL | Status: DC | PRN

## 2020-10-11 MED ORDER — MAGNESIUM SULFATE 2 GM/50ML IV SOLN
2.0000 g | Freq: Once | INTRAVENOUS | Status: AC
  Administered 2020-10-11: 2 g via INTRAVENOUS
  Filled 2020-10-11: qty 50

## 2020-10-11 MED ORDER — ACETAMINOPHEN 325 MG PO TABS: 650.0000 mg | ORAL_TABLET | ORAL | Status: DC | PRN

## 2020-10-11 MED ORDER — HYDRALAZINE HCL 25 MG PO TABS: 25.0000 mg | ORAL_TABLET | Freq: Four times a day (QID) | ORAL | Status: DC | PRN

## 2020-10-11 MED ORDER — ATORVASTATIN CALCIUM 10 MG PO TABS
20.0000 mg | ORAL_TABLET | Freq: Every day | ORAL | Status: DC
  Administered 2020-10-11 – 2020-10-13 (×3): 20 mg via ORAL
  Filled 2020-10-11 (×3): qty 2

## 2020-10-11 MED ORDER — DIPHENHYDRAMINE HCL 25 MG PO CAPS
25.0000 mg | ORAL_CAPSULE | Freq: Four times a day (QID) | ORAL | Status: DC | PRN
  Administered 2020-10-11 – 2020-10-12 (×2): 25 mg via ORAL
  Filled 2020-10-11 (×2): qty 1

## 2020-10-11 MED ORDER — INSULIN ASPART 100 UNIT/ML ~~LOC~~ SOLN
0.0000 [IU] | Freq: Three times a day (TID) | SUBCUTANEOUS | Status: DC
  Administered 2020-10-12: 3 [IU] via SUBCUTANEOUS
  Administered 2020-10-12: 5 [IU] via SUBCUTANEOUS
  Administered 2020-10-13: 3 [IU] via SUBCUTANEOUS

## 2020-10-11 MED ORDER — STROKE: EARLY STAGES OF RECOVERY BOOK
Freq: Once | Status: AC
  Filled 2020-10-11: qty 1

## 2020-10-11 MED ORDER — IOHEXOL 350 MG/ML SOLN
100.0000 mL | Freq: Once | INTRAVENOUS | Status: AC | PRN
  Administered 2020-10-11: 100 mL via INTRAVENOUS

## 2020-10-11 MED ORDER — NICOTINE 14 MG/24HR TD PT24
14.0000 mg | MEDICATED_PATCH | Freq: Every day | TRANSDERMAL | Status: DC
  Administered 2020-10-12: 14 mg via TRANSDERMAL
  Filled 2020-10-11 (×3): qty 1

## 2020-10-11 MED ORDER — PIOGLITAZONE HCL 45 MG PO TABS
45.0000 mg | ORAL_TABLET | Freq: Every day | ORAL | Status: DC
  Administered 2020-10-12 – 2020-10-13 (×2): 45 mg via ORAL
  Filled 2020-10-11 (×3): qty 1

## 2020-10-11 MED ORDER — ACETAMINOPHEN 650 MG RE SUPP: 650.0000 mg | RECTAL | Status: DC | PRN

## 2020-10-11 MED ORDER — MORPHINE SULFATE (PF) 4 MG/ML IV SOLN
4.0000 mg | Freq: Once | INTRAVENOUS | Status: AC
  Administered 2020-10-11: 4 mg via INTRAVENOUS
  Filled 2020-10-11: qty 1

## 2020-10-11 MED ORDER — INSULIN ASPART 100 UNIT/ML ~~LOC~~ SOLN
0.0000 [IU] | Freq: Every day | SUBCUTANEOUS | Status: DC
  Administered 2020-10-11 – 2020-10-12 (×2): 2 [IU] via SUBCUTANEOUS

## 2020-10-11 MED ORDER — SENNOSIDES-DOCUSATE SODIUM 8.6-50 MG PO TABS: 1.0000 | ORAL_TABLET | Freq: Every evening | ORAL | Status: DC | PRN

## 2020-10-11 NOTE — ED Notes (Signed)
Patient transported to CT 

## 2020-10-11 NOTE — H&P (Signed)
History and Physical    Jill Barnes AQT:[id number] DOB: [birth year] DOA: 10/11/2020  PCP: Lyman Bishop, DO (Confirm with patient/family/NH records and if not entered, this has to be entered at [facility] point of entry) Patient coming from: Home  I have personally briefly reviewed patient's old medical records in [place]  Chief Complaint: Headache and headache  HPI: Jill Barnes is a 59 y.o. adult with medical history significant of PE noncompliant with anticoagulation, IDDM, HTN, hyperlipidemia, cigarette smoke, presented with new onset of headache and balance issue.  Symptoms started about 2 days ago, onset was sudden, primarily reported with patient woke up and complained about " too much light out there, and eyes hurt" and when she got up and walked her gait was very wobbly but there was no fall.  She also complaining about sharp-like headache in the right frontal area persistent no nauseous vomit.  Symptoms persisted over the last 2 days and family decided to take her to the ED.  Patient was hospitalized August this year and was discharged on Xarelto for new onset of bilateral PE (was considered to be provoked PE secondary to Covid infection in July 2021).  Patient however finished 30 days of provided supply of Xarelto and stopped taking because of financial issue.  Patient currently does not complain any chest pain shortness breath.  Daughter reported patient never complained about muscle aches joint pain.  Fear of light or rash recently or in the past.  Patient denied any numbness weakness of the limbs. ED Course: CT head showed about demarcated parenchymal hypodensity involving the posterior right temporal occipital region, CTA showed occlusion of the M2 branches of the right MCA territory infarct.  Neurology reviewed the CT and the CTA result and recommend patient admitted to [facility] for further stroke work-up.  Review of Systems: As per HPI otherwise 14 point review of systems  negative.    Past Medical History:  Diagnosis Date  . COPD (chronic obstructive pulmonary disease) ([place])   . Diabetes mellitus without complication ([place])   . Kidney stone     Past Surgical History:  Procedure Laterality Date  . ABDOMINAL HYSTERECTOMY    . BLADDER SURGERY       reports that she has been smoking. She has been smoking about 1.00 pack per day. She has never used smokeless tobacco. She reports that she does not drink alcohol and does not use drugs.  Allergies  Allergen Reactions  . Sulfa Antibiotics Shortness Of Breath and Swelling    All-over swelling  . Penicillins Rash    Family History  Problem Relation Age of Onset  . Heart failure Father      Prior to Admission medications   Medication Sig Start Date End Date Taking? Authorizing Provider  atorvastatin (LIPITOR) 20 MG tablet Take 1 tablet (20 mg total) by mouth daily. 07/02/20  Yes Elgergawy, Silver Huguenin, MD  glipiZIDE (GLUCOTROL XL) 10 MG 24 hr tablet Take 1 tablet (10 mg total) by mouth daily with breakfast. 07/02/20  Yes Elgergawy, Silver Huguenin, MD  insulin NPH Human (NOVOLIN N) 100 UNIT/ML injection Inject 20 Units into the skin 2 (two) times daily before a meal.   Yes [provider]  pantoprazole (PROTONIX) 40 MG tablet Take 1 tablet (40 mg total) by mouth daily. 07/03/20  Yes Elgergawy, Silver Huguenin, MD  Phenylephrine-APAP-guaiFENesin (TYLENOL SINUS SEVERE PO) Take 1 capsule by mouth once.   Yes [provider]  pioglitazone (ACTOS) 45 MG tablet  Take 1 tablet (45 mg total) by mouth daily. 07/02/20  Yes Elgergawy, Silver Huguenin, MD  albuterol (PROVENTIL HFA;VENTOLIN HFA) 108 (90 Base) MCG/ACT inhaler Inhale 2 puffs into the lungs every 6 (six) hours as needed for wheezing or shortness of breath. Patient not taking: Reported on 10/11/2020 12/18/17   Florencia Reasons, MD  rivaroxaban (XARELTO) 20 MG TABS tablet Take 1 tablet (20 mg total) by mouth daily with supper. Patient not taking: Reported on 10/11/2020  07/22/20   Elgergawy, Silver Huguenin, MD    Physical Exam: Vitals:   10/11/20 1401 10/11/20 1416 10/11/20 1423 10/11/20 1520  BP:   (!) 128/106 (!) 137/58  Pulse: 89  84 79  Resp:   18 20  Temp:  98.4 F (36.9 C)    TempSrc:  Oral  Oral  SpO2: 92%  94% 93%  Weight:      Height:        Constitutional: NAD, calm, comfortable Vitals:   10/11/20 1401 10/11/20 1416 10/11/20 1423 10/11/20 1520  BP:   (!) 128/106 (!) 137/58  Pulse: 89  84 79  Resp:   18 20  Temp:  98.4 F (36.9 C)    TempSrc:  Oral  Oral  SpO2: 92%  94% 93%  Weight:      Height:       Eyes: PERRL, lids and conjunctivae normal ENMT: Mucous membranes are moist. Posterior pharynx clear of any exudate or lesions.Normal dentition.  Neck: normal, supple, no masses, no thyromegaly Respiratory: clear to auscultation bilaterally, no wheezing, no crackles. Normal respiratory effort. No accessory muscle use.  Cardiovascular: Regular rate and rhythm, no murmurs / rubs / gallops. No extremity edema. 2+ pedal pulses. No carotid bruits.  Abdomen: no tenderness, no masses palpated. No hepatosplenomegaly. Bowel sounds positive.  Musculoskeletal: no clubbing / cyanosis. No joint deformity upper and lower extremities. Good ROM, no contractures. Normal muscle tone.  Skin: no rashes, lesions, ulcers. No induration Neurologic: CN 2-12 grossly intact. Sensation intact, DTR normal. Strength 5/5 in all 4.  Psychiatric: Lethargic    Labs on Admission: I have personally reviewed following labs and imaging studies  CBC: Recent Labs  Lab 10/11/20 0358 10/11/20 0416  WBC 9.3  --   NEUTROABS 5.3  --   HGB 16.1* 16.7*  HCT 49.2* 49.0*  MCV 91.8  --   PLT 278  --    Basic Metabolic Panel: Recent Labs  Lab 10/11/20 0358 10/11/20 0416  NA 137 140  K 3.5 3.4*  CL 102  --   CO2 25  --   GLUCOSE 184*  --   BUN 16  --   CREATININE 0.98  --   CALCIUM 8.9  --   MG 1.9  --    GFR: Estimated Creatinine Clearance (by C-G formula based  on SCr of 0.98 mg/dL) Female: 74.4 mL/min Female: 90.6 mL/min Liver Function Tests: Recent Labs  Lab 10/11/20 0358  AST 16  ALT 21  ALKPHOS 103  BILITOT 0.2*  PROT 7.4  ALBUMIN 3.6   No results for input(s): LIPASE, AMYLASE in the last 168 hours. No results for input(s): AMMONIA in the last 168 hours. Coagulation Profile: Recent Labs  Lab 10/11/20 0358  INR 1.0   Cardiac Enzymes: No results for input(s): CKTOTAL, CKMB, CKMBINDEX, TROPONINI in the last 168 hours. BNP (last 3 results) No results for input(s): PROBNP in the last 8760 hours. HbA1C: No results for input(s): HGBA1C in the last 72 hours. CBG: Recent Labs  Lab 10/11/20 0335  GLUCAP 228*   Lipid Profile: No results for input(s): CHOL, HDL, LDLCALC, TRIG, CHOLHDL, LDLDIRECT in the last 72 hours. Thyroid Function Tests: No results for input(s): TSH, T4TOTAL, FREET4, T3FREE, THYROIDAB in the last 72 hours. Anemia Panel: No results for input(s): VITAMINB12, FOLATE, FERRITIN, TIBC, IRON, RETICCTPCT in the last 72 hours. Urine analysis:    Component Value Date/Time   COLORURINE YELLOW 10/11/2020 0650   APPEARANCEUR CLEAR 10/11/2020 0650   LABSPEC 1.010 10/11/2020 0650   PHURINE 6.5 10/11/2020 0650   GLUCOSEU NEGATIVE 10/11/2020 0650   HGBUR SMALL (A) 10/11/2020 0650   BILIRUBINUR NEGATIVE 10/11/2020 0650   KETONESUR NEGATIVE 10/11/2020 0650   PROTEINUR NEGATIVE 10/11/2020 0650   UROBILINOGEN 1.0 05/11/2014 1323   NITRITE NEGATIVE 10/11/2020 0650   LEUKOCYTESUR NEGATIVE 10/11/2020 0650    Radiological Exams on Admission: CT Angio Head W or Wo Contrast  Result Date: 10/11/2020 CLINICAL DATA:  Initial evaluation for subacute stroke. EXAM: CT ANGIOGRAPHY HEAD AND NECK TECHNIQUE: Multidetector CT imaging of the head and neck was performed using the standard protocol during bolus administration of intravenous contrast. Multiplanar CT image reconstructions and MIPs were obtained to evaluate the vascular anatomy.  Carotid stenosis measurements (when applicable) are obtained utilizing NASCET criteria, using the distal internal carotid diameter as the denominator. CONTRAST:  156m OMNIPAQUE IOHEXOL 350 MG/ML SOLN COMPARISON:  Prior CT from earlier the same day. FINDINGS: CTA NECK FINDINGS Aortic arch: Visualized aortic arch of normal caliber with normal branch pattern. Mild atheromatous change about the origin of the great vessels without hemodynamically significant stenosis. Right carotid system: Right CCA patent from its origin to the bifurcation without stenosis. Multifocal calcified plaque about the right bifurcation/proximal right ICA with associated stenosis of up to 60% by NASCET criteria. Right ICA otherwise patent to the skull base without stenosis, dissection or occlusion. Left carotid system: Left CCA patent from its origin to the bifurcation without stenosis. Bulky calcified plaque about the left bifurcation/proximal left ICA with associated stenosis of up to 60% by NASCET criteria. Left ICA partially medialized into the retropharyngeal space but is otherwise widely patent to the skull base without stenosis, dissection or occlusion. Vertebral arteries: Both vertebral arteries arise from the subclavian arteries. No proximal subclavian artery stenosis. Right vertebral artery slightly dominant. Single focus of calcified plaque noted at the pre foraminal right V1 segment without significant stenosis. Vertebral arteries otherwise patent without stenosis, dissection or occlusion. Skeleton: No acute osseous abnormality. No discrete or worrisome osseous lesions. Poor dentition noted. Other neck: No other acute soft tissue abnormality within the neck. No adenopathy. 1 cm left thyroid nodule noted (series 4, image 56), felt to be of doubtful significance given size and patient age. No follow-up imaging recommended regarding this lesion. No other mass lesion. Upper chest: Better evaluated on concomitant CT of the chest. Review  of the MIP images confirms the above findings CTA HEAD FINDINGS Anterior circulation: Petrous segments patent bilaterally. Scattered atheromatous change within the cavernous/supraclinoid ICAs with associated mild to moderate multifocal narrowing, left worse than right. A1 segments patent bilaterally. Left A1 hypoplastic, accounting for the slightly diminutive left ICA is compared to the right. Normal anterior communicating artery complex. Anterior cerebral arteries patent to their distal aspects without stenosis. M1 segments patent bilaterally. On the right, there is occlusion of a proximal right M2 branch, in keeping with the right MCA territory infarct (series 9, image 59). Left MCA branches well perfused. Posterior circulation: Vertebral arteries patent to the vertebrobasilar  junction without stenosis. Both picas patent. Basilar patent to its distal aspect without stenosis. Superior cerebral arteries patent bilaterally. Both PCAs well perfused to their distal aspects without appreciable stenosis. Small right posterior communicating artery noted. Venous sinuses: Grossly patent allowing for timing the contrast bolus. Anatomic variants: None significant.  No aneurysm. Review of the MIP images confirms the above findings IMPRESSION: 1. Occlusion of a proximal right M2 branch, in keeping with the right MCA territory infarct. 2. Atheromatous change about the carotid bifurcations with associated stenoses of up to 60% bilaterally. 3. Atherosclerotic change within the carotid siphons with associated mild to moderate multifocal narrowing, left worse than right. Electronically Signed   By: Jeannine Boga M.D.   On: 10/11/2020 05:35   CT Head Wo Contrast  Result Date: 10/11/2020 CLINICAL DATA:  Initial evaluation for acute headache, balance difficulty. EXAM: CT HEAD WITHOUT CONTRAST TECHNIQUE: Contiguous axial images were obtained from the base of the skull through the vertex without intravenous contrast.  COMPARISON:  None available. FINDINGS: Brain: Cerebral volume within normal limits for age. Patchy hypodensity seen within the periventricular and deep white matter both cerebral hemispheres most consistent with chronic small vessel ischemic disease. Multiple scattered remote infarcts noted about the cerebellum bilaterally. There is a well demarcated somewhat wedge-shaped parenchymal hypodensity involving the right temporal occipital region, consistent with a subacute right MCA territory infarct. No associated hemorrhage or significant regional mass effect. No other definite acute large vessel territory infarct. Vague hypodensity involving the left greater than right occipital poles favored to be artifactual on this exam. No acute intracranial hemorrhage. No mass lesion or midline shift. No hydrocephalus or extra-axial fluid collection. Vascular: Suspected asymmetric hyperdensity involving right MCA branch and/or branches, likely occluded given the subacute right MCA distribution infarct. No other asymmetric hyperdense vessel. Skull: Scalp soft tissues within normal limits.  Calvarium intact. Sinuses/Orbits: Globes and orbital soft tissues within normal limits. Mild scattered mucosal thickening noted within the ethmoidal air cells and maxillary sinuses. Paranasal sinuses are otherwise clear. No mastoid effusion. Other: None. IMPRESSION: 1. Well-demarcated parenchymal hypodensity involving the posterior right temporal occipital region, consistent with subacute right MCA territory infarct. No associated hemorrhage or significant regional mass effect. 2. Underlying age-related cerebral atrophy with chronic small vessel ischemic disease, with multiple remote bilateral cerebellar infarcts. Results discussed by telephone at the time of interpretation on 10/11/2020 at 4:12 am with Dr. Veatrice Kells. Electronically Signed   By: Jeannine Boga M.D.   On: 10/11/2020 04:22   CT Angio Neck W and/or Wo Contrast  Result  Date: 10/11/2020 CLINICAL DATA:  Initial evaluation for subacute stroke. EXAM: CT ANGIOGRAPHY HEAD AND NECK TECHNIQUE: Multidetector CT imaging of the head and neck was performed using the standard protocol during bolus administration of intravenous contrast. Multiplanar CT image reconstructions and MIPs were obtained to evaluate the vascular anatomy. Carotid stenosis measurements (when applicable) are obtained utilizing NASCET criteria, using the distal internal carotid diameter as the denominator. CONTRAST:  162m OMNIPAQUE IOHEXOL 350 MG/ML SOLN COMPARISON:  Prior CT from earlier the same day. FINDINGS: CTA NECK FINDINGS Aortic arch: Visualized aortic arch of normal caliber with normal branch pattern. Mild atheromatous change about the origin of the great vessels without hemodynamically significant stenosis. Right carotid system: Right CCA patent from its origin to the bifurcation without stenosis. Multifocal calcified plaque about the right bifurcation/proximal right ICA with associated stenosis of up to 60% by NASCET criteria. Right ICA otherwise patent to the skull base without stenosis, dissection or  occlusion. Left carotid system: Left CCA patent from its origin to the bifurcation without stenosis. Bulky calcified plaque about the left bifurcation/proximal left ICA with associated stenosis of up to 60% by NASCET criteria. Left ICA partially medialized into the retropharyngeal space but is otherwise widely patent to the skull base without stenosis, dissection or occlusion. Vertebral arteries: Both vertebral arteries arise from the subclavian arteries. No proximal subclavian artery stenosis. Right vertebral artery slightly dominant. Single focus of calcified plaque noted at the pre foraminal right V1 segment without significant stenosis. Vertebral arteries otherwise patent without stenosis, dissection or occlusion. Skeleton: No acute osseous abnormality. No discrete or worrisome osseous lesions. Poor  dentition noted. Other neck: No other acute soft tissue abnormality within the neck. No adenopathy. 1 cm left thyroid nodule noted (series 4, image 56), felt to be of doubtful significance given size and patient age. No follow-up imaging recommended regarding this lesion. No other mass lesion. Upper chest: Better evaluated on concomitant CT of the chest. Review of the MIP images confirms the above findings CTA HEAD FINDINGS Anterior circulation: Petrous segments patent bilaterally. Scattered atheromatous change within the cavernous/supraclinoid ICAs with associated mild to moderate multifocal narrowing, left worse than right. A1 segments patent bilaterally. Left A1 hypoplastic, accounting for the slightly diminutive left ICA is compared to the right. Normal anterior communicating artery complex. Anterior cerebral arteries patent to their distal aspects without stenosis. M1 segments patent bilaterally. On the right, there is occlusion of a proximal right M2 branch, in keeping with the right MCA territory infarct (series 9, image 59). Left MCA branches well perfused. Posterior circulation: Vertebral arteries patent to the vertebrobasilar junction without stenosis. Both picas patent. Basilar patent to its distal aspect without stenosis. Superior cerebral arteries patent bilaterally. Both PCAs well perfused to their distal aspects without appreciable stenosis. Small right posterior communicating artery noted. Venous sinuses: Grossly patent allowing for timing the contrast bolus. Anatomic variants: None significant.  No aneurysm. Review of the MIP images confirms the above findings IMPRESSION: 1. Occlusion of a proximal right M2 branch, in keeping with the right MCA territory infarct. 2. Atheromatous change about the carotid bifurcations with associated stenoses of up to 60% bilaterally. 3. Atherosclerotic change within the carotid siphons with associated mild to moderate multifocal narrowing, left worse than right.  Electronically Signed   By: Jeannine Boga M.D.   On: 10/11/2020 05:35   CT Angio Chest PE W and/or Wo Contrast  Result Date: 10/11/2020 CLINICAL DATA:  59 year old female with chest pain. EXAM: CT ANGIOGRAPHY CHEST WITH CONTRAST TECHNIQUE: Multidetector CT imaging of the chest was performed using the standard protocol during bolus administration of intravenous contrast. Multiplanar CT image reconstructions and MIPs were obtained to evaluate the vascular anatomy. CONTRAST:  154m OMNIPAQUE IOHEXOL 350 MG/ML SOLN COMPARISON:  06/27/2020 CT FINDINGS: Cardiovascular: This is a technically satisfactory study. No pulmonary emboli are identified. There is no evidence of thoracic aortic aneurysm/definite dissection. UPPER limits normal heart size is noted. Moderate coronary artery atherosclerotic calcifications are in identified, greatest in the LAD. No pericardial effusion. Mediastinum/Nodes: No enlarged mediastinal, hilar, or axillary lymph nodes. Thyroid gland, trachea, and esophagus demonstrate no significant findings. Lungs/Pleura: Mild ground-glass opacities within both lungs is relatively unchanged. Scarring/atelectasis within the lingula again identified. No new abnormalities are present. No consolidation, pleural effusion or pneumothorax noted. Upper Abdomen: No acute abnormality. Musculoskeletal: No acute abnormality. Review of the MIP images confirms the above findings. IMPRESSION: 1. No evidence of acute abnormality. No evidence of pulmonary  emboli or thoracic aortic aneurysm/definite dissection. 2. Unchanged nonspecific mild ground-glass opacities within both lungs. Previously identified consolidation/airspace opacities have resolved. 3. Coronary artery disease. Electronically Signed   By: Margarette Canada M.D.   On: 10/11/2020 05:51   DG Chest Portable 1 View  Result Date: 10/11/2020 CLINICAL DATA:  CVA. EXAM: PORTABLE CHEST 1 VIEW COMPARISON:  06/27/2020 FINDINGS: UPPER limits normal heart size  again noted. Patchy opacity within the LEFT LOWER lung does not appear significantly changed. RIGHT lung is clear. No large pleural effusion or pneumothorax. No acute bony abnormalities are identified. IMPRESSION: Unchanged LEFT LOWER lung patchy opacity which may represent atelectasis or airspace disease. Electronically Signed   By: Margarette Canada M.D.   On: 10/11/2020 05:43    EKG: Independently reviewed.  Sinus, nonspecific ST changes  Assessment/Plan Active Problems:   Ischemic stroke (HCC)   CVA (cerebral vascular accident) ([place])  (please populate well all problems here in Problem List. (For example, if patient is on BP meds at home and you resume or decide to hold them, it is a problem that needs to be her. Same for CAD, COPD, HLD and so on)  Right MCA stroke -With her occlusion of the M2 branch of MCA responding to the stroke territory. -CT also shows old patient has extensive atherosclerotic plaques on multiple intracranial vessels. -Risk modification, start aspirin and continue statin, given the size of the stroke appears to be more than one third of the right hemisphere, will hold off Xarelto for now, will discuss with neurology about when to restart anticoagulation. The coagulation plan in August this year was 3 to 47-month however given the stroke event now appears to be an independent thromboembolic event as CTA chest done today not showing any PE. -UDS -Discussed with patient and daughter over the phone regarding stop smoking. -Echo  Headache -Not a common symptom of stroke, will send a ESR -Reglan plus Benadryl for headache   IDDM -Continue pioglitazone, hold sulfonylurea -Sliding scale  HTN -Fairly controlled, as needed hydralazine for now  Nicotine dependence -Nicotine patch ordered   DVT prophylaxis: Lovenox Code Status: Full code Family Communication: Daughter over the phone  disposition Plan: Expect more than 2 midnight hospital stay for stroke work-up and PT  evaluation as patient appears to be very symptomatic likely will need rehab. Consults called: Neurology Admission status: Telemetry admission   PLequita HaltMD [facility] Pager [phone] 10/11/2020, 4:59 PM

## 2020-10-11 NOTE — ED Provider Notes (Signed)
[facility] EMERGENCY DEPARTMENT Provider Note   CSN: [id number] Arrival date & time: 10/11/20  0315     History Chief Complaint  Patient presents with  . Headache    Jill Barnes is a 59 y.o. adult.  The history is provided by the patient.  Headache Pain location:  Frontal Quality:  Sharp Radiates to:  Face Severity at highest:  9/10 Onset quality:  Gradual Duration:  2 days Timing:  Constant Progression:  Worsening Chronicity:  New Similar to prior headaches: no   Context: not coughing   Relieved by:  Nothing Worsened by:  Nothing Ineffective treatments:  None tried Associated symptoms: facial pain, loss of balance and photophobia   Associated symptoms: no abdominal pain, no congestion, no cough, no diarrhea, no fever, no nausea, no neck pain, no neck stiffness, no seizures and no URI   Risk factors: no anger   Patient with recent covid and PE who was prescribed Xarelto but did not take it due to expense presents with headache, facial pain and photophobia x 2 days.  She states she feels off balance.       Past Medical History:  Diagnosis Date  . COPD (chronic obstructive pulmonary disease) (HCC)   . Diabetes mellitus without complication (HCC)   . Kidney stone     Patient Active Problem List   Diagnosis Date Noted  . Acute diastolic CHF (congestive heart failure) (HCC) 06/29/2020  . Obesity (BMI 30-39.9) 06/28/2020  . Acute deep vein thrombosis (DVT) of calf muscle vein of right lower extremity (HCC) 06/28/2020  . Pneumonia due to COVID-19 virus 06/28/2020  . Tobacco abuse 06/28/2020  . Diabetes mellitus type 2, uncontrolled, with complications (HCC) 06/28/2020  . Bilateral pulmonary embolism (HCC) 06/27/2020  . Respiratory failure (HCC) 06/27/2020  . Pulmonary embolism (HCC) 06/27/2020  . Acute hypoxemic respiratory failure due to COVID-19 (HCC) 06/20/2020  . Type 2 diabetes mellitus without complication (HCC) 06/20/2020  . Hypokalemia  06/20/2020  . Acute respiratory failure with hypoxia (HCC) 06/19/2020  . Hypoxia 12/15/2017  . COPD exacerbation (HCC) 12/15/2017  . Tachycardia 12/15/2017  . Nicotine dependence 12/15/2017    Past Surgical History:  Procedure Laterality Date  . ABDOMINAL HYSTERECTOMY    . BLADDER SURGERY       OB History   No obstetric history on file.     Family History  Problem Relation Age of Onset  . Heart failure Father     Social History   Tobacco Use  . Smoking status: Current Every Day Smoker    Packs/day: 1.00  . Smokeless tobacco: Never Used  Vaping Use  . Vaping Use: Never used  Substance Use Topics  . Alcohol use: No  . Drug use: No    Home Medications Prior to Admission medications   Medication Sig Start Date End Date Taking? Authorizing Provider  albuterol (PROVENTIL HFA;VENTOLIN HFA) 108 (90 Base) MCG/ACT inhaler Inhale 2 puffs into the lungs every 6 (six) hours as needed for wheezing or shortness of breath. 12/18/17   Albertine GratesXu, Fang, MD  atorvastatin (LIPITOR) 20 MG tablet Take 1 tablet (20 mg total) by mouth daily. 07/02/20   Elgergawy, Leana Roeawood S, MD  dexamethasone (DECADRON) 6 MG tablet Take 1 tablet (6 mg total) by mouth daily. 07/03/20   Elgergawy, Leana Roeawood S, MD  glipiZIDE (GLUCOTROL XL) 10 MG 24 hr tablet Take 1 tablet (10 mg total) by mouth daily with breakfast. 07/02/20   Elgergawy, Leana Roeawood S, MD  guaiFENesin-dextromethorphan [facility](ROBITUSSIN DM)  100-10 MG/5ML syrup Take 10 mLs by mouth every 6 (six) hours as needed for cough. 06/23/20   Arrien, York Ram, MD  insulin NPH-regular Human (NOVOLIN 70/30 RELION) (70-30) 100 UNIT/ML injection Inject 15 Units into the skin in the morning and at bedtime.    [provider]  pantoprazole (PROTONIX) 40 MG tablet Take 1 tablet (40 mg total) by mouth daily. 07/03/20   Elgergawy, Leana Roe, MD  pioglitazone (ACTOS) 45 MG tablet Take 1 tablet (45 mg total) by mouth daily. 07/02/20   Elgergawy, Leana Roe, MD  Rivaroxaban (XARELTO) 15  MG TABS tablet Take 1 tablet (15 mg total) by mouth 2 (two) times daily with a meal. 07/02/20   Elgergawy, Leana Roe, MD  rivaroxaban (XARELTO) 20 MG TABS tablet Take 1 tablet (20 mg total) by mouth daily with supper. 07/22/20   Elgergawy, Leana Roe, MD    Allergies    Sulfa antibiotics and Penicillins  Review of Systems   Review of Systems  Constitutional: Negative for fever.  HENT: Negative for congestion.   Eyes: Positive for photophobia.  Respiratory: Negative for cough.   Cardiovascular: Negative for chest pain.  Gastrointestinal: Negative for abdominal pain, diarrhea and nausea.  Genitourinary: Negative for dysuria.  Musculoskeletal: Negative for neck pain and neck stiffness.  Skin: Negative for rash.  Neurological: Positive for headaches and loss of balance. Negative for tremors and seizures.  Psychiatric/Behavioral: Negative for agitation.  All other systems reviewed and are negative.   Physical Exam Updated Vital Signs BP (!) 169/89   Pulse 78   Temp 98 F (36.7 C) (Oral)   Resp (!) 26   Ht 5\' 6"  (1.676 m)   Wt 99.2 kg   SpO2 100%   BMI 35.30 kg/m   Physical Exam Vitals and nursing note reviewed.  Constitutional:      General: She is not in acute distress.    Appearance: She is well-developed.  HENT:     Head: Normocephalic and atraumatic.     Mouth/Throat:     Mouth: Mucous membranes are moist.  Eyes:     Extraocular Movements: Extraocular movements intact.     Pupils: Pupils are equal, round, and reactive to light.  Neck:     Meningeal: Brudzinski's sign and Kernig's sign absent.  Cardiovascular:     Rate and Rhythm: Normal rate and regular rhythm.     Heart sounds: Normal heart sounds.  Pulmonary:     Effort: Pulmonary effort is normal.     Breath sounds: Normal breath sounds.  Abdominal:     General: Bowel sounds are normal.     Palpations: Abdomen is soft.     Tenderness: There is no abdominal tenderness.  Musculoskeletal:        General: No  swelling. Normal range of motion.     Cervical back: Normal range of motion and neck supple. No rigidity.  Lymphadenopathy:     Cervical: No cervical adenopathy.  Skin:    General: Skin is warm and dry.     Capillary Refill: Capillary refill takes less than 2 seconds.  Neurological:     Mental Status: She is alert.     GCS: GCS eye subscore is 4. GCS verbal subscore is 5. GCS motor subscore is 6.     Motor: No weakness.     Deep Tendon Reflexes: Reflexes normal.  Psychiatric:        Mood and Affect: Mood is anxious.     ED Results /  Procedures / Treatments   Labs (all labs ordered are listed, but only abnormal results are displayed) Results for orders placed or performed during the hospital encounter of 10/11/20  Resp Panel by RT-PCR (Flu A&B, Covid) Nasopharyngeal Swab   Specimen: Nasopharyngeal Swab; Nasopharyngeal(NP) swabs in vial transport medium  Result Value Ref Range   SARS Coronavirus 2 by RT PCR NEGATIVE NEGATIVE   Influenza A by PCR NEGATIVE NEGATIVE   Influenza B by PCR NEGATIVE NEGATIVE  CBC with Differential/Platelet  Result Value Ref Range   WBC 9.3 4.0 - 10.5 K/uL   RBC 5.36 (H) 3.87 - 5.11 MIL/uL   Hemoglobin 16.1 (H) 12.0 - 15.0 g/dL   HCT 99.8 (H) 36 - 46 %   MCV 91.8 80.0 - 100.0 fL   MCH 30.0 26.0 - 34.0 pg   MCHC 32.7 30.0 - 36.0 g/dL   RDW 33.8 25.0 - 53.9 %   Platelets 278 150 - 400 K/uL   nRBC 0.0 0.0 - 0.2 %   Neutrophils Relative % 58 %   Neutro Abs 5.3 1.7 - 7.7 K/uL   Lymphocytes Relative 29 %   Lymphs Abs 2.7 0.7 - 4.0 K/uL   Monocytes Relative 9 %   Monocytes Absolute 0.9 0.1 - 1.0 K/uL   Eosinophils Relative 3 %   Eosinophils Absolute 0.3 0.0 - 0.5 K/uL   Basophils Relative 1 %   Basophils Absolute 0.1 0.0 - 0.1 K/uL   Immature Granulocytes 0 %   Abs Immature Granulocytes 0.04 0.00 - 0.07 K/uL  Basic metabolic panel  Result Value Ref Range   Sodium 137 135 - 145 mmol/L   Potassium 3.5 3.5 - 5.1 mmol/L   Chloride 102 98 - 111  mmol/L   CO2 25 22 - 32 mmol/L   Glucose, Bld 184 (H) 70 - 99 mg/dL   BUN 16 6 - 20 mg/dL   Creatinine, Ser 7.67 0.44 - 1.00 mg/dL   Calcium 8.9 8.9 - 34.1 mg/dL   GFR, Estimated >93 >79 mL/min   Anion gap 10 5 - 15  Hepatic function panel  Result Value Ref Range   Total Protein 7.4 6.5 - 8.1 g/dL   Albumin 3.6 3.5 - 5.0 g/dL   AST 16 15 - 41 U/L   ALT 21 0 - 44 U/L   Alkaline Phosphatase 103 38 - 126 U/L   Total Bilirubin 0.2 (L) 0.3 - 1.2 mg/dL   Bilirubin, Direct 0.1 0.0 - 0.2 mg/dL   Indirect Bilirubin 0.1 (L) 0.3 - 0.9 mg/dL  Protime-INR  Result Value Ref Range   Prothrombin Time 12.4 11.4 - 15.2 seconds   INR 1.0 0.8 - 1.2  Magnesium  Result Value Ref Range   Magnesium 1.9 1.7 - 2.4 mg/dL  POC CBG, ED  Result Value Ref Range   Glucose-Capillary 228 (H) 70 - 99 mg/dL  I-Stat venous blood gas, [facility] ED)  Result Value Ref Range   pH, Ven 7.376 7.25 - 7.43   pCO2, Ven 48.7 44 - 60 mmHg   pO2, Ven 23.0 (LL) 32 - 45 mmHg   Bicarbonate 28.7 (H) 20.0 - 28.0 mmol/L   TCO2 30 22 - 32 mmol/L   O2 Saturation 38.0 %   Acid-Base Excess 2.0 0.0 - 2.0 mmol/L   Sodium 140 135 - 145 mmol/L   Potassium 3.4 (L) 3.5 - 5.1 mmol/L   Calcium, Ion 1.23 1.15 - 1.40 mmol/L   HCT 49.0 (H) 36 - 46 %  Hemoglobin 16.7 (H) 12.0 - 15.0 g/dL   Patient temperature 07.1 F    Sample type VENOUS    Comment NOTIFIED PHYSICIAN    CT Head Wo Contrast  Result Date: 10/11/2020 CLINICAL DATA:  Initial evaluation for acute headache, balance difficulty. EXAM: CT HEAD WITHOUT CONTRAST TECHNIQUE: Contiguous axial images were obtained from the base of the skull through the vertex without intravenous contrast. COMPARISON:  None available. FINDINGS: Brain: Cerebral volume within normal limits for age. Patchy hypodensity seen within the periventricular and deep white matter both cerebral hemispheres most consistent with chronic small vessel ischemic disease. Multiple scattered remote infarcts noted about the  cerebellum bilaterally. There is a well demarcated somewhat wedge-shaped parenchymal hypodensity involving the right temporal occipital region, consistent with a subacute right MCA territory infarct. No associated hemorrhage or significant regional mass effect. No other definite acute large vessel territory infarct. Vague hypodensity involving the left greater than right occipital poles favored to be artifactual on this exam. No acute intracranial hemorrhage. No mass lesion or midline shift. No hydrocephalus or extra-axial fluid collection. Vascular: Suspected asymmetric hyperdensity involving right MCA branch and/or branches, likely occluded given the subacute right MCA distribution infarct. No other asymmetric hyperdense vessel. Skull: Scalp soft tissues within normal limits.  Calvarium intact. Sinuses/Orbits: Globes and orbital soft tissues within normal limits. Mild scattered mucosal thickening noted within the ethmoidal air cells and maxillary sinuses. Paranasal sinuses are otherwise clear. No mastoid effusion. Other: None. IMPRESSION: 1. Well-demarcated parenchymal hypodensity involving the posterior right temporal occipital region, consistent with subacute right MCA territory infarct. No associated hemorrhage or significant regional mass effect. 2. Underlying age-related cerebral atrophy with chronic small vessel ischemic disease, with multiple remote bilateral cerebellar infarcts. Results discussed by telephone at the time of interpretation on 10/11/2020 at 4:12 am with Dr. Cy Blamer. Electronically Signed   By: Rise Mu M.D.   On: 10/11/2020 04:22    EKG  EKG Interpretation  Date/Time:  Saturday October 11 2020 04:01:40 EST Ventricular Rate:  75 PR Interval:    QRS Duration: 97 QT Interval:  400 QTC Calculation: 447 R Axis:   84 Text Interpretation: Sinus rhythm Atrial premature complexes Probable left atrial enlargement Borderline repolarization abnormality Confirmed by  Nicanor Alcon, Vearl Aitken ([id number]) on 10/11/2020 5:17:06 AM        Radiology No results found.  Procedures Procedures (including critical care time)  Medications Ordered in ED Medications  magnesium sulfate IVPB 2 g 50 mL (2 g Intravenous New Bag/Given 10/11/20 0500)  iohexol (OMNIPAQUE) 350 MG/ML injection 100 mL (100 mLs Intravenous Contrast Given 10/11/20 0433)    ED Course  I have reviewed the triage vital signs and the nursing notes.  Pertinent labs & imaging results that were available during my care of the patient were reviewed by me and considered in my medical decision making (see chart for details).    MDM Reviewed: previous chart, nursing note and vitals Reviewed previous: labs Interpretation: labs, x-ray, CT scan and ECG (normal creatinine, subacute stroke right hemisphere by me on CT) Consults: neurology and admitting MD (per Dr. Wilford Corner, admit to medicine )   Final Clinical Impression(s) / ED Diagnoses Final diagnoses:  Cerebrovascular accident (CVA), unspecified mechanism (HCC)    Admit to medicine    Makarios Madlock, MD 10/11/20 8832

## 2020-10-11 NOTE — ED Triage Notes (Signed)
Pt /o frontal headache and feeling off balance x 2 days.

## 2020-10-11 NOTE — Progress Notes (Signed)
Patient arrived via EMS to room [phone]. A&OX3, VS stable.

## 2020-10-11 NOTE — ED Notes (Signed)
Pt to remain NPO per Dr. Nicanor Alcon

## 2020-10-11 NOTE — ED Notes (Signed)
She turns herself in the bed, and with clear speech informs me that she has dropped her phone, which I retrieve for her.

## 2020-10-11 NOTE — ED Notes (Signed)
Note: Tresa Endo, RN in report this morning told me that Dr. Nicanor Alcon instructed her not to perform the swallow study; and to keep her n.p.o., which we do.

## 2020-10-11 NOTE — Consult Note (Addendum)
Neurology Consultation Reason for Consult:  Right MCA infarct  Referring Physician: Dr. Chipper Herb  CC: Headache; right MCA territory stroke   History is obtained from: chart and patient  HPI: Jill Barnes is a 59 y.o. right handed, obese white female with a PMHx significant for bilateral PE (presumed provoked secondary to covid infection in July 2021) noncompliant with anticoagulation after one month on DOAC, IDDM, HTN, COPD, hyperlipidemia, current cigarette smoking, who presented with new onset of headache and balance difficulties. She reports headache started suddenly about two days prior to presentation, woke her up from her sleep. Headache is described as sharp like an ice-pick in the right frontal area, associated with photophobia but no nausea or vomiting. As headache was entering the second day without relief her family brought her to the ED for evaluation.   With respect to her bilateral PE she reportedly had taken some Xarelto but did not complete the full 3 month course prescribed.   Head CT without contrast showed a well demarcated hypodensity involving the posterior right temporo-occipital region, consistent with a subacute Right MCA territory stroke without hemorrhage or mass effect. CT also showed multiple remote bilateral cerebellar infarcts. CTA head and neck showed an occluded right M2 branch, 60% stenoses of bilateral carotid bifurcations, and atherosclerotic changes at the carotid siphons.   She was admitted with a diagnosis of a right MCA stroke, and neurology was consulted regarding the above. We thank the primary team for their kind referral.   LKW: two days PTA  tPA given?: No, out of time window  Endovascular thrombectomy performed: No, out of time window with completed stroke on CT  Premorbid modified rankin scale: 0       ROS: A 14 point ROS was performed and is negative except as noted in the HPI.     Past Medical History:  Diagnosis Date  . COPD (chronic  obstructive pulmonary disease) (HCC)   . Diabetes mellitus without complication (HCC)   . Kidney stone       Family History  Problem Relation Age of Onset  . Heart failure Father       Social History:  reports that she has been smoking. She has been smoking about 1.00 pack per day. She has never used smokeless tobacco. She reports that she does not drink alcohol and does not use drugs.    Exam: Current vital signs: BP (!) 137/58 (BP Location: Left Arm)   Pulse 79   Temp 98.4 F (36.9 C) (Oral)   Resp 20   Ht 5\' 6"  (1.676 m)   Wt 99.2 kg   SpO2 93%   BMI 35.30 kg/m  Vital signs in last 24 hours: Temp:  [98 F (36.7 C)-98.4 F (36.9 C)] 98.4 F (36.9 C) (11/20 1416) Pulse Rate:  [72-89] 79 (11/20 1520) Resp:  [12-26] 20 (11/20 1520) BP: (114-177)/(54-106) 137/58 (11/20 1520) SpO2:  [91 %-100 %] 93 % (11/20 1520) Weight:  [99.2 kg] 99.2 kg (11/20 0324)   Physical Exam  Constitutional: Appears well-developed and well-nourished.  Psych: Affect appropriate to situation - anxious and tearful Eyes: No scleral injection HENT: No OP obstrucion MSK: no joint deformities.  Cardiovascular: Normal rate and regular rhythm.  Respiratory: Effort normal, non-labored breathing GI: Soft.  No distension. There is no tenderness.  Skin: WDI  Neuro: Mental Status: Patient is awake, alert, oriented to person, place, month, year, and situation. She complains of hunger, photophobia and generalized headache. Patient is able to give some  history but relative at bedside supplements this several times during the interview. Speech is fluent with no signs of aphasia. No hemineglect  Cranial Nerves: II: Visual Fields are full. Pupils are equal, round, and reactive to light.    III,IV, VI: EOMI without ptosis or diplopia.  V: Facial sensation is symmetric to temperature VII: Facial movement is symmetric.  VIII: hearing is intact to voice X: Uvula elevates symmetrically XI: Shoulder shrug is  symmetric. XII: tongue is midline without atrophy or fasciculations.  Motor: Tone is normal. Bulk is normal. 5/5 strength was present in all four extremities.   Sensory: Sensation is symmetric to light touch and temperature in the arms and legs.  Deep Tendon Reflexes: 2+ and symmetric in the biceps and patellae.   Plantars: Toes are downgoing bilaterally.   Cerebellar: FNF and HKS are intact bilaterally   I have reviewed the images obtained:   CTH 10/11/2020:  1. Well-demarcated parenchymal hypodensity involving the posterior right temporal occipital region, consistent with subacute right MCA territory infarct. No associated hemorrhage or significant regional mass effect. 2. Underlying age-related cerebral atrophy with chronic small vessel ischemic disease, with multiple remote bilateral cerebellar Infarcts.  CTA head and neck 10/11/2020 1. Occlusion of a proximal right M2 branch, in keeping with the right MCA territory infarct. 2. Atheromatous change about the carotid bifurcations with associated stenoses of up to 60% bilaterally. 3. Atherosclerotic change within the carotid siphons with associated mild to moderate multifocal narrowing, left worse than right.   Assessment:  59 year old obese right handed white female with a history of hypertension, diabetes, dyslipidemia, recent bilateral PE off Xarelto due to noncompliance who presented with two days of right frontal headache. CT head revealed a subacute right MCA territory stroke. - DDx for stroke mechanism includes atheroembolic (artery to artery embolization in the setting of carotid disease), cardioembolic and paradoxical embolization (given history of recent partially treated PE).  - Exam reveals no focal deficits   Recommendations:  -hemoglobin a1c, fasting lipid panel -MRI brain without contrast. -PT/OT/Speech consult. -Echocardiogram -Carotid ultrasound followed by Vascular Surgery consultation for the symptomatic  right ICA stenosis seen on CTA -Prophylactic therapy: aspirin 81 mg po qd for now. May need to switch back to DOAC -Repeat CTA of chest to assess for possible resolution of PE  -Risk factor modification: Smoking cessation -Telemetry monitoring -Frequent neuro checks -IVF -Decrease blood pressure by 15% daily to BP goal of <130/80 -Statin therapy for goal LDL <70. -Repeat brain CT if there is significant neurologic decline.  -Stroke team to follow in the AM -Will need outpatient follow up with Dr. Pearlean Brownie or Dr. Roda Shutters at the [facility]  I have seen and examined the patient. I have formulated the assessment and recommendations. 59 year old obese right handed white female with a history of hypertension, diabetes, dyslipidemia, recent bilateral PE off Xarelto due to noncompliance who presented with two days of right frontal headache. CT head revealed a subacute right MCA territory stroke. Exam is nonfocal. Recommendations as above.  Electronically signed: Dr. Caryl Pina

## 2020-10-11 NOTE — Plan of Care (Signed)
Called by Dr. Nicanor Alcon from med [facility] regarding this patient's head CT that shows a established right MCA territory infarct. Recommended transfer to [facility]. CTA head and neck done at [facility]-right M2 occlusion. Outside the window for TPA or intervention at this time. CTA chest negative for PE-was supposed to be on anticoagulation for PE but presumably noncompliant to medications. She will be seen by the in-house neurology team once she arrives at [facility]. Plan discussed with Dr. Nicanor Alcon at around 4:30-5:00 AM.  -- Milon Dikes, MD [facility] Pager: [phone] If 7pm to 7am, please call on call as listed on AMION.

## 2020-10-11 NOTE — ED Notes (Signed)
Pt agitated when asked questions and follow commands. Pt is drowsy when not stimulated.

## 2020-10-11 NOTE — ED Notes (Signed)
She is awake and alert. She tells me it is "Sunday". I inform her that it is Saturday. With clear and articulate speech, she places a call (unassisted) to her family with her cell phone.

## 2020-10-12 ENCOUNTER — Inpatient Hospital Stay (HOSPITAL_COMMUNITY): Payer: Self-pay

## 2020-10-12 DIAGNOSIS — I6389 Other cerebral infarction: Secondary | ICD-10-CM

## 2020-10-12 DIAGNOSIS — R609 Edema, unspecified: Secondary | ICD-10-CM

## 2020-10-12 LAB — GLUCOSE, CAPILLARY
Glucose-Capillary: 181 mg/dL — ABNORMAL HIGH (ref 70–99)
Glucose-Capillary: 223 mg/dL — ABNORMAL HIGH (ref 70–99)
Glucose-Capillary: 227 mg/dL — ABNORMAL HIGH (ref 70–99)

## 2020-10-12 LAB — ECHOCARDIOGRAM COMPLETE
Area-P 1/2: 2.37 cm2
Height: 66 in
S' Lateral: 2.9 cm
Weight: 3499.14 oz

## 2020-10-12 LAB — HEMOGLOBIN A1C
Hgb A1c MFr Bld: 9.1 % — ABNORMAL HIGH (ref 4.8–5.6)
Mean Plasma Glucose: 214.47 mg/dL

## 2020-10-12 LAB — LIPID PANEL
Cholesterol: 195 mg/dL (ref 0–200)
HDL: 31 mg/dL — ABNORMAL LOW (ref >40)
LDL Cholesterol: 125 mg/dL — ABNORMAL HIGH (ref 0–99)
Total CHOL/HDL Ratio: 6.3 RATIO
Triglycerides: 197 mg/dL — ABNORMAL HIGH (ref <150)
VLDL: 39 mg/dL (ref 0–40)

## 2020-10-12 MED ORDER — CLOPIDOGREL BISULFATE 75 MG PO TABS
75.0000 mg | ORAL_TABLET | Freq: Every day | ORAL | Status: DC
  Administered 2020-10-13: 75 mg via ORAL
  Filled 2020-10-12: qty 1

## 2020-10-12 MED ORDER — INSULIN NPH (HUMAN) (ISOPHANE) 100 UNIT/ML ~~LOC~~ SUSP
10.0000 [IU] | Freq: Two times a day (BID) | SUBCUTANEOUS | Status: DC
  Administered 2020-10-12 – 2020-10-13 (×3): 10 [IU] via SUBCUTANEOUS
  Filled 2020-10-12: qty 10

## 2020-10-12 MED ORDER — BUTALBITAL-APAP-CAFFEINE 50-325-40 MG PO TABS
2.0000 | ORAL_TABLET | ORAL | Status: DC | PRN
  Administered 2020-10-12: 2 via ORAL
  Filled 2020-10-12: qty 2

## 2020-10-12 MED ORDER — POTASSIUM CHLORIDE CRYS ER 20 MEQ PO TBCR
20.0000 meq | EXTENDED_RELEASE_TABLET | Freq: Two times a day (BID) | ORAL | Status: AC
  Administered 2020-10-12 (×2): 20 meq via ORAL
  Filled 2020-10-12 (×2): qty 1

## 2020-10-12 NOTE — Progress Notes (Signed)
Carotid duplex has been completed.   Preliminary results in CV Proc.   Blanch Media 10/12/2020 10:09 AM

## 2020-10-12 NOTE — Progress Notes (Signed)
Patient found sitting on floor by RN. Pt stated that she was trying to fix her tray when she slid to floor. Patient has been educated multiple times throughout the day not to get up without staff assistance. Bed alarm was in use. Patient is back in bed. She has no complaints of pain and denies hitting her head. VS are stable. MD and family notified. Will continue to monitor.

## 2020-10-12 NOTE — Progress Notes (Signed)
Tried to order low bed, told it would be a couple of hours before one becomes available.

## 2020-10-12 NOTE — Progress Notes (Signed)
  Echocardiogram 2D Echocardiogram has been performed.  Delcie Roch 10/12/2020, 10:16 AM

## 2020-10-12 NOTE — Progress Notes (Addendum)
PROGRESS NOTE  Jill Barnes  DOB: 1961/07/20  PCP: Koren Shiver, DO ONG:[id number]  DOA: 10/11/2020  LOS: 1 day   Chief Complaint  Patient presents with   Headache    Brief narrative: Jill Barnes is a 59 y.o. adult with PMH significant for pulmonary embolism August 2021 (presumed provoked secondary to Covid infection in July 2021), noncompliant with anticoagulation, DM 2, hypertension, hyperlipidemia, smoking. On 10/11/2020, patient presented to the ED with nonremitting new onset headache and balance difficulties. Headache started 2 days prior to presentation, woke her from her sleep, sharp like an ice pick into right frontal area associated with photophobia.  Patient was hospitalized August this year and was discharged on Xarelto for new onset of bilateral PE (was considered to be provoked PE secondary to Covid infection in July 2021).  Patient however finished 30 days of provided supply of Xarelto and stopped taking because of financial issue.   In the ED, hemodynamically stable Labs unremarkable. CT head showed a well-demarcated parenchymal hypodensity involving the posterior right temporal occipital region consistent with subacute right MCA territory infarct without any associated hemorrhage or mass-effect.  She also had evidence of multiple remote bilateral cerebellar infarcts and chronic small vessel disease. CTA head and neck showed occlusion of her proximal right M2 branch and also 60% stenosis of carotid bifurcation bilaterally. CT angio of chest did not show any evidence of pulmonary embolism. Neurology consultation was obtained. Patient was admitted to hospital service for stroke work-up.  Subjective: Patient was seen and examined this morning. [name].  Complains of headache being persistent. Chart reviewed Remains hemodynamically stable Labs this morning with HDL low at 31, LDL elevated to 125, hemoglobin A1c elevated to  9.1  Assessment/Plan: Acute right MCA territory stroke -Presented with nonremitting headache and loss of balance. -Imagings as above showing acute right MCA territory stroke as well as 60% stenosis of carotid bifurcation bilaterally -Stroke work-up initiated -Echocardiogram obtained.  EF 60 to 65% with no regional wall motion abnormalities, grade 2 diastolic dysfunction.  No cardiac source of embolism. -HDL 31, LDL 125, A1c 9.1 -Continue to monitor in telemetry -PT/OT/ST eval. -Neurology following. -Patient was on Lipitor 20 mg daily at home.  Aspirin 81 mg daily has been added.  Bilateral carotid artery stenosis -60% stenosis bilaterally at the carotid bifurcation -Carotid duplex ordered.  If findings consistent, will get vascular surgery consultation.  Type II diabetes mellitus -A1c 9.1 on 11/21. -Home meds include Novolin NPH 20 units BID, glipizide 10 mg daily, Actos 45 mg daily. -Currently on Actos and sliding scale insulin.  I will resume Novolin NPH this morning at 10 units twice daily. Recent Labs  Lab 10/11/20 0335 10/11/20 2156 10/12/20 0618  GLUCAP 228* 207* 181*   Essential hypertension -Not on meds at home. -Currently on IV hydration as needed.  Nicotine dependence -Nicotine patch ordered  Mobility: PT eval obtained.  Family prefers home health PT Code Status:   Code Status: Full Code  Nutritional status: Body mass index is 35.3 kg/m.     Diet Order            Diet heart healthy/carb modified Room service appropriate? Yes; Fluid consistency: Thin  Diet effective now                 DVT prophylaxis: enoxaparin (LOVENOX) injection 40 mg Start: 10/11/20 1745   Antimicrobials:  None Fluid: None Consultants: Neurology Family Communication:  Daughter at bedside  Status is: Inpatient  Remains inpatient appropriate because: Pending stroke work-up, may need vascular surgery consultation   Dispo: The patient is from: Home              Anticipated  d/c is to: Home likely              Anticipated d/c date is: 1 to 2 days              Patient currently is not medically stable to d/c.       Infusions:    Scheduled Meds:  aspirin EC  81 mg Oral Daily   atorvastatin  20 mg Oral Daily   [START ON 10/13/2020] clopidogrel  75 mg Oral Q breakfast   enoxaparin (LOVENOX) injection  40 mg Subcutaneous Q24H   insulin aspart  0-15 Units Subcutaneous TID WC   insulin aspart  0-5 Units Subcutaneous QHS   insulin NPH Human  10 Units Subcutaneous BID AC   nicotine  14 mg Transdermal Daily   pantoprazole  40 mg Oral Daily   pioglitazone  45 mg Oral Daily   potassium chloride  20 mEq Oral BID    Antimicrobials: Anti-infectives (From admission, onward)   None      PRN meds: acetaminophen **OR** acetaminophen (TYLENOL) oral liquid 160 mg/5 mL **OR** acetaminophen, butalbital-acetaminophen-caffeine, diphenhydrAMINE, hydrALAZINE, metoCLOPramide (REGLAN) injection, senna-docusate   Objective: Vitals:   10/12/20 0800 10/12/20 1134  BP: 109/82 128/64  Pulse: 78 91  Resp: 18 18  Temp: 98.5 F (36.9 C) 99.4 F (37.4 C)  SpO2:  90%    Intake/Output Summary (Last 24 hours) at 10/12/2020 1424 Last data filed at 10/11/2020 1427 Gross per 24 hour  Intake --  Output 400 ml  Net -400 ml   Filed Weights   10/11/20 0324  Weight: 99.2 kg   Weight change:  Body mass index is 35.3 kg/m.   Physical Exam: General exam: In mild to moderate distress because of headache Skin: No rashes, lesions or ulcers. HEENT: Atraumatic, normocephalic, no obvious bleeding Lungs: Clear to auscultation bilaterally CVS: Regular rate and rhythm, no murmur GI/Abd soft, nontender, nondistended, bowel sound present CNS: Alert, awake, oriented x3 Psychiatry: Depressed look Extremities: No pedal edema, no calf tenderness  Data Review: I have personally reviewed the laboratory data and studies available.  Recent Labs  Lab 10/11/20 0358  10/11/20 0416  WBC 9.3  --   NEUTROABS 5.3  --   HGB 16.1* 16.7*  HCT 49.2* 49.0*  MCV 91.8  --   PLT 278  --    Recent Labs  Lab 10/11/20 0358 10/11/20 0416  NA 137 140  K 3.5 3.4*  CL 102  --   CO2 25  --   GLUCOSE 184*  --   BUN 16  --   CREATININE 0.98  --   CALCIUM 8.9  --   MG 1.9  --     F/u labs ordered  Signed, Lorin Glass, MD [facility] 10/12/2020

## 2020-10-12 NOTE — Progress Notes (Signed)
STROKE TEAM PROGRESS NOTE   HISTORY OF PRESENT ILLNESS (per record) Jill Barnes is a 59 y.o. right handed, obese white female with a PMHx significant for bilateralPE (presumed provoked secondary to covid infection in July 2021) noncompliant with anticoagulation after one month on DOAC,IDDM,HTN, COPD, hyperlipidemia, current cigarette smoking,who presented with new onset of headache and balance difficulties. She reports headache started suddenly about two days prior to presentation, woke her up from her sleep. Headache is described as sharp like an ice-pick in the right frontal area, associated with photophobia but no nausea or vomiting. As headache was entering the second day without relief her family brought her to the ED for evaluation.   With respect to her bilateral PE she reportedly had taken some Xarelto but did not complete the full 3 month course prescribed.   Head CT without contrast showed a well demarcated hypodensity involving the posterior right temporo-occipital region, consistent with a subacute Right MCA territory stroke without hemorrhage or mass effect. CT also showed multiple remote bilateral cerebellar infarcts. CTA head and neck showed an occluded right M2 branch, 60% stenoses of bilateral carotid bifurcations, and atherosclerotic changes at the carotid siphons.   She was admitted with a diagnosis of a right MCA stroke, and neurology was consulted regarding the above. We thank the primary team for their kind referral.   LKW: two days PTA  tPA given?: No, out of time window  Endovascular thrombectomy performed: No, out of time window with completed stroke on CT  Premorbid modified rankin scale: 0    INTERVAL HISTORY The patient daughter is at the bedside.  The patient continues to complain of severe headaches.  She reports 10/10 frontal headaches.  She also seem to have visual obscuration described as floaters from time to time since she started these headaches on  yesterday.    OBJECTIVE Vitals:   10/12/20 0145 10/12/20 0415 10/12/20 0800 10/12/20 1134  BP: (!) 118/56 140/72 109/82 128/64  Pulse: 78 80 78 91  Resp: 19 19 18 18   Temp: 98.9 F (37.2 C) 99.2 F (37.3 C) 98.5 F (36.9 C) 99.4 F (37.4 C)  TempSrc: Oral Oral Oral Oral  SpO2: 90% 91%  90%  Weight:      Height:        CBC:  Recent Labs  Lab 10/11/20 0358 10/11/20 0416  WBC 9.3  --   NEUTROABS 5.3  --   HGB 16.1* 16.7*  HCT 49.2* 49.0*  MCV 91.8  --   PLT 278  --     Basic Metabolic Panel:  Recent Labs  Lab 10/11/20 0358 10/11/20 0416  NA 137 140  K 3.5 3.4*  CL 102  --   CO2 25  --   GLUCOSE 184*  --   BUN 16  --   CREATININE 0.98  --   CALCIUM 8.9  --   MG 1.9  --     Lipid Panel:     Component Value Date/Time   CHOL 195 10/12/2020 0157   TRIG 197 (H) 10/12/2020 0157   HDL 31 (L) 10/12/2020 0157   CHOLHDL 6.3 10/12/2020 0157   VLDL 39 10/12/2020 0157   LDLCALC 125 (H) 10/12/2020 0157   HgbA1c:  Lab Results  Component Value Date   HGBA1C 9.1 (H) 10/12/2020   Urine Drug Screen: No results found for: LABOPIA, COCAINSCRNUR, LABBENZ, AMPHETMU, THCU, LABBARB  Alcohol Level No results found for: ETH  IMAGING  CT Angio Head W or Wo  Contrast CT Angio Neck W and/or Wo Contrast 10/11/2020 IMPRESSION:  1. Occlusion of a proximal right M2 branch, in keeping with the right MCA territory infarct.  2. Atheromatous change about the carotid bifurcations with associated stenoses of up to 60% bilaterally.  3. Atherosclerotic change within the carotid siphons with associated mild to moderate multifocal narrowing, left worse than right.   CT Head Wo Contrast 10/11/2020 IMPRESSION:  1. Well-demarcated parenchymal hypodensity involving the posterior right temporal occipital region, consistent with subacute right MCA territory infarct. No associated hemorrhage or significant regional mass effect.  2. Underlying age-related cerebral atrophy with chronic small  vessel ischemic disease, with multiple remote bilateral cerebellar infarcts.   MRI BRAIN WO CONTRAST - pending   CT Angio Chest PE W and/or Wo Contrast 10/11/2020 IMPRESSION:  1. No evidence of acute abnormality. No evidence of pulmonary emboli or thoracic aortic aneurysm/definite dissection.  2. Unchanged nonspecific mild ground-glass opacities within both lungs. Previously identified consolidation/airspace opacities have resolved.  3. Coronary artery disease.   DG Chest Portable 1 View 10/11/2020 IMPRESSION:  Unchanged LEFT LOWER lung patchy opacity which may represent atelectasis or airspace disease.   ECHOCARDIOGRAM COMPLETE 10/12/2020 IMPRESSIONS   1. Left ventricular ejection fraction, by estimation, is 60 to 65%. The left ventricle has normal function. The left ventricle has no regional wall motion abnormalities. Left ventricular diastolic parameters are consistent with Grade II diastolic dysfunction (pseudonormalization). Elevated left ventricular end-diastolic pressure.   2. Right ventricular systolic function is normal. The right ventricular size is normal.   3. The mitral valve is abnormal. Trivial mitral valve regurgitation. No evidence of mitral stenosis.   4. The aortic valve is normal in structure. Aortic valve regurgitation is not visualized. Mild to moderate aortic valve sclerosis/calcification is present, without any evidence of aortic stenosis.   5. The inferior vena cava is normal in size with greater than 50% respiratory variability, suggesting right atrial pressure of 3 mmHg.   VAS US CAROTID 10/12/2020 Summary:  Right Carotid: Velocities in the right ICA are consistent with a 1-39% stenosis.  Left Carotid: Velocities in the left ICA are consistent with a 1-39% stenosis.  Vertebrals: Bilateral vertebral arteries demonstrate antegrade flow.     Preliminary    ECG - SR rate 75 BPM. (See cardiology reading for complete details)  PHYSICAL EXAM Blood pressure  128/64, pulse 91, temperature 99.4 F (37.4 C), temperature source Oral, resp. rate 18, height 5\' 6"  (1.676 m), weight 99.2 kg, SpO2 90 %. GENERAL: She is in rather marked discomfort from frontal headaches.  She also has visual disturbance with floaters.  HEENT: Neck is supple no trauma noted.  Dentition poor.  ABDOMEN: soft  EXTREMITIES: No edema   BACK: Normal  SKIN: Normal by inspection.    MENTAL STATUS: She is awake and lucid and cooperates with evaluation.  No dysarthria is noted.  No aphasia is noted.  She follows commands well.  CRANIAL NERVES: Pupils are equal, round and reactive to light and accomodation; extra ocular movements are full, there is no significant nystagmus; visual fields are full; upper and lower facial muscles are normal in strength and symmetric, there is no flattening of the nasolabial folds; tongue is midline; uvula is midline; shoulder elevation is normal.  MOTOR: Normal tone, bulk and strength.  No drift.  COORDINATION: Left finger to nose is normal, right finger to nose is normal, No rest tremor; no intention tremor; no postural tremor; no bradykinesia.  SENSATION: Normal to light touch, temperature,  and pain.  GAIT: Normal.          ASSESSMENT/PLAN [name] is a 59 y.o. adult with history of bilateralPE (presumed provoked secondary to covid infection in July 2021) noncompliant with anticoagulation after one month on DOAC,IDDM,obesity, HTN, COPD, hyperlipidemia, current cigarette smoking, presenting with headache x 2 days with photophobia, and balance issues. She did not receive IV t-PA due to late presentation (>4.5 hours from time of onset).   Stroke: Rt MCA infarct - embolic - possibly hypercoagulable  Resultant severe headaches but no neurologic functional deficits.  Code Stroke CT Head - not ordered   CT head - Well-demarcated parenchymal hypodensity involving the posterior right temporal occipital region, consistent with  subacute right MCA territory infarct. No associated hemorrhage or significant regional mass effect. Underlying age-related cerebral atrophy with chronic small vessel ischemic disease, with multiple remote bilateral cerebellar infarcts.   MRI head - pending  MRA head - not ordered  CTA H&N - Occlusion of a proximal right M2 branch, in keeping with the right MCA territory infarct. Atheromatous change about the carotid bifurcations with associated stenoses of up to 60% bilaterally.   CT Perfusion - not ordered  Carotid Doppler - unremarkable  2D Echo - EF 60 - 65%. No cardiac source of emboli identified.   Sars Corona Virus 2 - negative  LDL - 125  HgbA1c - 9.1  UDS - pending  VTE prophylaxis - Lovenox Diet  Diet Order            Diet Heart Room service appropriate? Yes; Fluid consistency: Thin  Diet effective now                 Xarelto (rivaroxaban) daily prior to admission, now on aspirin 81 mg daily and clopidogrel 75 mg daily  Patient counseled to be compliant with her antithrombotic medications  Ongoing aggressive stroke risk factor management  Therapy recommendations:  pending  Disposition:  Pending  Hypertension  Home BP meds: none   Current BP meds: none   Stable . Permissive hypertension (OK if < 220/120) but gradually normalize in 5-7 days  . Long-term BP goal normotensive  Hyperlipidemia  Home Lipid lowering medication: Lipitor 20 mg daily  LDL 125, goal < 70  Current lipid lowering medication: Lipitor 20 mg daily   Continue statin at discharge  Diabetes  Home diabetic meds: piogliazone ; insulin ; glocophage  Current diabetic meds: insulin ; pioglitazone  HgbA1c 9.1, goal < 7.0 Recent Labs    10/11/20 0335 10/11/20 2156 10/12/20 0618  GLUCAP 228* 207* 181*    Other Stroke Risk Factors  Cigarette smoker - advised to stop smoking  Obesity, Body mass index is 35.3 kg/m., recommend weight loss, diet and exercise as appropriate    Covid July 2021 with bilateral PEs  Other Active Problems  Code status - Full code  Hypokalemia - potassium - 3.5->3.4 - supplement  Covid July 2021 with bilateral PEs  Bilateral carotid artery disease up to 60% by CTA  Previous strokes by imaging  Severe headaches will start on Fioricet as needed  Hospital day # 1  Dr. Gerilyn Pilgrim -- The patient was seen and examined by me; notes, chart and tests reviewed and discussed with the practice provider, other providers, patient, and family.     To contact Stroke Continuity provider, please refer to [email or web address]. After hours, contact General Neurology

## 2020-10-12 NOTE — Plan of Care (Signed)

## 2020-10-12 NOTE — Evaluation (Signed)
Physical Therapy Evaluation Patient Details Name: Jill Barnes MRN: [id number] DOB: July 19, 1961 Today's Date: 10/12/2020   History of Present Illness  59 y.o. right handed, obese white female with a PMHx significant for bilateral PE (presumed provoked secondary to covid infection in July 2021) noncompliant with anticoagulation after one month on DOAC, IDDM, HTN, COPD, hyperlipidemia, current cigarette smoking, who presented with new onset of headache and balance difficulties. Head CT without contrast showed a well demarcated hypodensity involving the posterior right temporo-occipital region, consistent with a subacute Right MCA territory stroke.  Clinical Impression  Pt presents to PT with deficits in functional mobility, gait, balance, endurance, strength, power, attention. Pt with significant inattention to L side when mobilizsing, bumping into multiple objects on left and having difficulty managing tasks with LUE. Pt is able to attend to left with verbal cues of PT, however does quickly forget during session. Pt also requires UE support to maintain balance when ambulating at this time. PT discusses the possibility of pursuing inpatient therapies however the pt declines. Pt would prefer to return home with 24/7 assistance and outpatient PT services. Pt's daughter is present and confirms they can provide 24/7 assistance. Pt will benefit from neuro specialized therapies. Pt has access to a RW.    Follow Up Recommendations Outpatient PT;Supervision/Assistance - 24 hour (outpatient neuro, pt refusing inpatient therapies)    Equipment Recommendations  None recommended by PT (pt owns RW, may consider shower seat pending OT recs)    Recommendations for Other Services       Precautions / Restrictions Precautions Precautions: Fall Restrictions Weight Bearing Restrictions: No      Mobility  Bed Mobility Overal bed mobility: Needs Assistance Bed Mobility: Supine to Sit     Supine to sit:  Min assist     General bed mobility comments: pt utilizies minA of daughter to pull up into sitting    Transfers Overall transfer level: Needs assistance Equipment used: Rolling walker (2 wheeled);None Transfers: Sit to/from Stand Sit to Stand: Min guard            Ambulation/Gait Ambulation/Gait assistance: Min assist Gait Distance (Feet): 80 Feet (80' x2, with RW and w/o RW) Assistive device: Rolling walker (2 wheeled);None Gait Pattern/deviations: Step-to pattern Gait velocity: reduced Gait velocity interpretation: <1.8 ft/sec, indicate of risk for recurrent falls General Gait Details: pt with step-to gait, pt tends to drift toward R side, holding railing intermittently when not utilizing RW. Pt bumps into multiple objects on L side and at times has difficulty maintaining L hand on RW. Significant impairment in attention to L side  Stairs            Wheelchair Mobility    Modified Rankin (Stroke Patients Only) Modified Rankin (Stroke Patients Only) Pre-Morbid Rankin Score: No symptoms Modified Rankin: Moderately severe disability     Balance Overall balance assessment: Needs assistance Sitting-balance support: No upper extremity supported;Feet supported Sitting balance-Leahy Scale: Good Sitting balance - Comments: supervision   Standing balance support: No upper extremity supported;During functional activity Standing balance-Leahy Scale: Fair                               Pertinent Vitals/Pain Pain Assessment: Faces Faces Pain Scale: Hurts a little bit Pain Location: head Pain Descriptors / Indicators: Aching Pain Intervention(s): Monitored during session    Home Living Family/patient expects to be discharged to:: Private residence Living Arrangements: Spouse/significant other;Children;Other relatives Available Help at  Discharge: Family;Available 24 hours/day Type of Home: House Home Access: Stairs to enter Entrance Stairs-Rails:  Right;Left;Can reach both Entrance Stairs-Number of Steps: 3 Home Layout: One level Home Equipment: Walker - 2 wheels      Prior Function Level of Independence: Independent               Hand Dominance        Extremity/Trunk Assessment   Upper Extremity Assessment Upper Extremity Assessment: LUE deficits/detail LUE Deficits / Details: ROM WFL, grossly 4-/5 strength    Lower Extremity Assessment Lower Extremity Assessment: Overall WFL for tasks assessed    Cervical / Trunk Assessment Cervical / Trunk Assessment: Normal  Communication   Communication: No difficulties  Cognition Arousal/Alertness: Awake/alert Behavior During Therapy: WFL for tasks assessed/performed Overall Cognitive Status: Impaired/Different from baseline Area of Impairment: Attention;Safety/judgement;Awareness;Problem solving                   Current Attention Level: Sustained     Safety/Judgement: Decreased awareness of safety;Decreased awareness of deficits Awareness: Emergent Problem Solving: Slow processing;Requires verbal cues        General Comments General comments (skin integrity, edema, etc.): VSS on RA, pt with slowed pursuits at times possibly related to impaired attention. L inattention apparent when mobilizing, bumping into multiple objects    Exercises     Assessment/Plan    PT Assessment Patient needs continued PT services  PT Problem List Decreased strength;Decreased activity tolerance;Decreased balance;Decreased mobility;Decreased cognition;Decreased knowledge of use of DME;Decreased safety awareness;Decreased knowledge of precautions       PT Treatment Interventions DME instruction;Gait training;Stair training;Functional mobility training;Therapeutic activities;Therapeutic exercise;Balance training;Neuromuscular re-education;Cognitive remediation;Patient/family education    PT Goals (Current goals can be found in the Care Plan section)  Acute Rehab PT  Goals Patient Stated Goal: To go home PT Goal Formulation: With patient/family Time For Goal Achievement: 10/26/20 Potential to Achieve Goals: Good Additional Goals Additional Goal #1: Pt will score >19/24 on DGI to indicate a reduced risk for falls    Frequency Min 4X/week   Barriers to discharge        Co-evaluation               AM-PAC PT "6 Clicks" Mobility  Outcome Measure Help needed turning from your back to your side while in a flat bed without using bedrails?: A Little Help needed moving from lying on your back to sitting on the side of a flat bed without using bedrails?: A Little Help needed moving to and from a bed to a chair (including a wheelchair)?: A Little Help needed standing up from a chair using your arms (e.g., wheelchair or bedside chair)?: A Little Help needed to walk in hospital room?: A Little Help needed climbing 3-5 steps with a railing? : A Lot 6 Click Score: 17    End of Session   Activity Tolerance: Patient tolerated treatment well Patient left: in bed;with call bell/phone within reach;with bed alarm set;with family/visitor present Nurse Communication: Mobility status PT Visit Diagnosis: Unsteadiness on feet (R26.81);Other abnormalities of gait and mobility (R26.89);Other symptoms and signs involving the nervous system (R29.898)    Time: 9604-5409 PT Time Calculation (min) (ACUTE ONLY): 32 min   Charges:   PT Evaluation $PT Eval Moderate Complexity: 1 Mod PT Treatments $Gait Training: 8-22 mins        Arlyss Gandy, PT, DPT Acute Rehabilitation Pager: [phone]   Arlyss Gandy 10/12/2020, 1:36 PM

## 2020-10-12 NOTE — Progress Notes (Signed)
Occupational Therapy Evaluation Patient Details Name: Jill Barnes MRN: [id number] DOB: January 04, 1961 Today's Date: 10/12/2020    History of Present Illness 59 y.o. right handed, obese white female with a PMHx significant for bilateral PE (presumed provoked secondary to covid infection in July 2021) noncompliant with anticoagulation after one month on DOAC, IDDM, HTN, COPD, hyperlipidemia, current cigarette smoking, who presented with new onset of headache and balance difficulties. Head CT without contrast showed a well demarcated hypodensity involving the posterior right temporo-occipital region, consistent with a subacute Right MCA territory stroke.   Clinical Impression   Pt with improved willingness for participation, pleasant and oriented x3. Endorsing indep with all ADL's and mobility at baseline working part time. Currently pt presents with mild acute focal deficits of decreased safety and insight, L strength of UE, and significant L field cut to what appears to be L homonymous hemianopsia. Despite extensive discussion and training on light house technique and concern for level of indep d/t visual deficits and resulting balance impairments, pt with preference for d/c to home if possible. Pt is unsafe for driving until cleared, and will require 24hr S/A if d/c to home. Currently pt requiring CGA/min guard for transfers and min A for ADL's in standing limited by balance and safety. OT will continue to follow acutely, with rec listed below.      Follow Up Recommendations  CIR;Supervision/Assistance - 24 hour (outpatient therapy if refusing inpatient recs)    Equipment Recommendations  Tub/shower bench    Recommendations for Other Services Rehab consult     Precautions / Restrictions Precautions L homonymous hemianopsia, fluctuating O2 levels on RA Precautions: Fall Restrictions Weight Bearing Restrictions: No      Mobility Bed Mobility Overal bed mobility: Needs Assistance Bed  Mobility: Supine to Sit     Supine to sit: Min guard     General bed mobility comments: pt utilizies minA of daughter to pull up into sitting    Transfers Overall transfer level: Needs assistance Equipment used: Rolling walker (2 wheeled) Transfers: Sit to/from Stand Sit to Stand: Min guard         General transfer comment: cues provided for safety with decreased awareness of L environment    Balance Overall balance assessment: Needs assistance Sitting-balance support: No upper extremity supported;Feet supported Sitting balance-Leahy Scale: Good Sitting balance - Comments: supervision   Standing balance support: No upper extremity supported;During functional activity Standing balance-Leahy Scale: Fair                             ADL either performed or assessed with clinical judgement   ADL Overall ADL's : Needs assistance/impaired Eating/Feeding: Set up   Grooming: Minimal assistance           Upper Body Dressing : Min guard   Lower Body Dressing: Minimal assistance   Toilet Transfer: Minimal assistance;RW   Toileting- Clothing Manipulation and Hygiene: Min guard         General ADL Comments: pt with elevated HR during session of up to 136 with seated EOB tasks limiting full self care routine     Vision Baseline Vision/History: Wears glasses Wears Glasses: Reading only Patient Visual Report: No change from baseline Vision Assessment?: Yes Eye Alignment: Within Functional Limits Ocular Range of Motion: Restricted on the left Tracking/Visual Pursuits: Right eye does not track medially;Left eye does not track laterally Saccades: Impaired - to be further tested in functional context Visual Fields: Left  homonymous hemianopsia Depth Perception: Undershoots Additional Comments: full homonymous hemianopsia noted with testing, and decreased awareness despite drastic visual cuing.      Perception Perception Perception Tested?: Yes Perception  Deficits: Inattention/neglect Inattention/Neglect: Does not attend to left visual field   Praxis      Pertinent Vitals/Pain Pain Assessment: 0-10 Pain Score: 2  Faces Pain Scale: Hurts a little bit Pain Location: headache Pain Descriptors / Indicators: Aching Pain Intervention(s): Monitored during session;Limited activity within patient's tolerance     Hand Dominance Right   Extremity/Trunk Assessment Upper Extremity Assessment Upper Extremity Assessment: LUE deficits/detail LUE Deficits / Details: grossly WFL but mild strength deficits of -4/5 on testing   Lower Extremity Assessment Lower Extremity Assessment: Defer to PT evaluation   Cervical / Trunk Assessment Cervical / Trunk Assessment: Normal   Communication Communication Communication: No difficulties   Cognition Arousal/Alertness: Awake/alert Behavior During Therapy: WFL for tasks assessed/performed Overall Cognitive Status: Impaired/Different from baseline Area of Impairment: Attention;Safety/judgement;Awareness;Problem solving                   Current Attention Level: Focused     Safety/Judgement: Decreased awareness of safety;Decreased awareness of deficits Awareness: Emergent Problem Solving: Slow processing;Requires verbal cues General Comments: inattention to L hemibody leading to difficulty with following some commands   General Comments  pt observed with elevated HR at EOB, inconsistent O2 reports on RA rangin from 86-91 with replacement of O2 via [place] improved to 98.    Exercises     Shoulder Instructions      Home Living Family/patient expects to be discharged to:: Private residence Living Arrangements: Spouse/significant other;Children;Other relatives Available Help at Discharge: Family;Available 24 hours/day Type of Home: House Home Access: Stairs to enter [employer] of Steps: 3 Entrance Stairs-Rails: Right;Left;Can reach both Home Layout: One level     Bathroom  Shower/Tub: Chief Strategy Officer: Standard     Home Equipment: Environmental consultant - 2 wheels          Prior Functioning/Environment Level of Independence: Independent        Comments: working part time for a [employer]        OT Problem List: Decreased coordination;Impaired vision/perception;Decreased cognition;Decreased safety awareness;Decreased knowledge of use of DME or AE;Decreased knowledge of precautions;Impaired balance (sitting and/or standing);Decreased activity tolerance;Decreased strength      OT Treatment/Interventions: Self-care/ADL training;Therapeutic exercise;DME and/or AE instruction;Energy conservation;Neuromuscular education;Cognitive remediation/compensation;Visual/perceptual remediation/compensation;Balance training;Therapeutic activities    OT Goals(Current goals can be found in the care plan section) Acute Rehab OT Goals Patient Stated Goal: go home OT Goal Formulation: With patient Time For Goal Achievement: 10/26/20 Potential to Achieve Goals: Good ADL Goals Pt Will Perform Upper Body Dressing: with modified independence Pt Will Perform Lower Body Dressing: with modified independence Pt Will Transfer to Toilet: with modified independence;regular height toilet;ambulating;bedside commode Additional ADL Goal #1: pt will require 0 cues for use of lighthouse scanning technique during standing ADL's/item retreival  OT Frequency: Min 2X/week   Barriers to D/C:            Co-evaluation              AM-PAC OT "6 Clicks" Daily Activity     Outcome Measure Help from another person eating meals?: None Help from another person taking care of personal grooming?: None Help from another person toileting, which includes using toliet, bedpan, or urinal?: A Little Help from another person bathing (including washing, rinsing, drying)?: A Lot Help from another person to  put on and taking off regular upper body clothing?: A Little Help from another person to  put on and taking off regular lower body clothing?: A Little 6 Click Score: 19   End of Session Equipment Utilized During Treatment: Gait belt;Rolling walker  Activity Tolerance: Patient tolerated treatment well Patient left: in bed;with bed alarm set  OT Visit Diagnosis: Unsteadiness on feet (R26.81);Other symptoms and signs involving the nervous system (R29.898);Other symptoms and signs involving cognitive function;Low vision, both eyes (H54.2)                Time: 2482-5003 OT Time Calculation (min): 32 min Charges:  OT General Charges $OT Visit: 1 Visit OT Evaluation $OT Eval Low Complexity: 1 Low OT Treatments $Self Care/Home Management : 8-22 mins  Aviendha Azbell OTR/L acute rehab services Office: [phone]   Wilhemena Durie 10/12/2020, 2:53 PM

## 2020-10-12 NOTE — Progress Notes (Signed)
OT Cancellation Note  Patient Details Name: Jill Barnes MRN: [id number] DOB: 1961-09-11   Cancelled Treatment:    Reason Eval/Treat Not Completed: Patient declined, despite reporting wanting to get up and out of bed, pt refusing participation with skilled OT services this AM. Wanting to 'be left alone'. OT will continue with attempts schedule permitting  Akiyah Eppolito OTR/L acute rehab services Office: [phone]   Wilhemena Durie 10/12/2020, 9:03 AM

## 2020-10-13 ENCOUNTER — Inpatient Hospital Stay (HOSPITAL_COMMUNITY): Payer: Self-pay

## 2020-10-13 DIAGNOSIS — R609 Edema, unspecified: Secondary | ICD-10-CM

## 2020-10-13 LAB — CBC WITH DIFFERENTIAL/PLATELET
Abs Immature Granulocytes: 0.03 10*3/uL (ref 0.00–0.07)
Basophils Absolute: 0.1 10*3/uL (ref 0.0–0.1)
Basophils Relative: 1 %
Eosinophils Absolute: 0.3 10*3/uL (ref 0.0–0.5)
Eosinophils Relative: 3 %
HCT: 47.4 % — ABNORMAL HIGH (ref 36.0–46.0)
Hemoglobin: 15.5 g/dL — ABNORMAL HIGH (ref 12.0–15.0)
Immature Granulocytes: 0 %
Lymphocytes Relative: 33 %
Lymphs Abs: 2.8 10*3/uL (ref 0.7–4.0)
MCH: 30.2 pg (ref 26.0–34.0)
MCHC: 32.7 g/dL (ref 30.0–36.0)
MCV: 92.4 fL (ref 80.0–100.0)
Monocytes Absolute: 0.8 10*3/uL (ref 0.1–1.0)
Monocytes Relative: 10 %
Neutro Abs: 4.5 10*3/uL (ref 1.7–7.7)
Neutrophils Relative %: 53 %
Platelets: 259 10*3/uL (ref 150–400)
RBC: 5.13 MIL/uL — ABNORMAL HIGH (ref 3.87–5.11)
RDW: 13 % (ref 11.5–15.5)
WBC: 8.6 10*3/uL (ref 4.0–10.5)
nRBC: 0 % (ref 0.0–0.2)

## 2020-10-13 LAB — BASIC METABOLIC PANEL
Anion gap: 13 (ref 5–15)
BUN: 15 mg/dL (ref 6–20)
CO2: 23 mmol/L (ref 22–32)
Calcium: 9 mg/dL (ref 8.9–10.3)
Chloride: 103 mmol/L (ref 98–111)
Creatinine, Ser: 1.05 mg/dL — ABNORMAL HIGH (ref 0.44–1.00)
GFR, Estimated: >60 mL/min (ref >60)
Glucose, Bld: 148 mg/dL — ABNORMAL HIGH (ref 70–99)
Potassium: 3.9 mmol/L (ref 3.5–5.1)
Sodium: 139 mmol/L (ref 135–145)

## 2020-10-13 LAB — GLUCOSE, CAPILLARY
Glucose-Capillary: 159 mg/dL — ABNORMAL HIGH (ref 70–99)
Glucose-Capillary: 145 mg/dL — ABNORMAL HIGH (ref 70–99)
Glucose-Capillary: 215 mg/dL — ABNORMAL HIGH (ref 70–99)

## 2020-10-13 MED ORDER — LISINOPRIL 5 MG PO TABS: 5.0000 mg | ORAL_TABLET | Freq: Every day | ORAL | Status: DC

## 2020-10-13 MED ORDER — ASPIRIN 81 MG PO TBEC: 81.0000 mg | DELAYED_RELEASE_TABLET | Freq: Every day | ORAL | 0 refills | Status: AC

## 2020-10-13 MED ORDER — CLOPIDOGREL BISULFATE 75 MG PO TABS: 75.0000 mg | ORAL_TABLET | Freq: Every day | ORAL | 2 refills | Status: AC

## 2020-10-13 MED ORDER — LISINOPRIL 5 MG PO TABS: 5.0000 mg | ORAL_TABLET | Freq: Every day | ORAL | 0 refills | Status: DC

## 2020-10-13 NOTE — Progress Notes (Addendum)
STROKE TEAM PROGRESS NOTE   INTERVAL HISTORY  Jill Barnes is a 59 y.o. right handed, obese white female with a PMHx significant for bilateralPE (presumed provoked secondary to covid infection in July 2021) noncompliant with anticoagulation after one month on DOAC,IDDM,HTN, COPD, hyperlipidemia, current cigarette smoking,who presented with new onset of headache and balance difficulties. She reports headache started suddenly about two days prior to presentation, woke her up from her sleep. Headache is described as sharp like an ice-pick in the right frontal area, associated with photophobia but no nausea or vomiting.   Head CT without contrast showed a well demarcated hypodensity involving the posterior right temporo-occipital region, consistent with a subacute Right MCA territory stroke without hemorrhage or mass effect. CT also showed multiple remote bilateral cerebellar infarcts. CTA head and neck showed an occluded right M2 branch, 60% stenoses of bilateral carotid bifurcations, and atherosclerotic changes at the carotid siphons. MRI brain done shows microvascular ischemic disease, with large size early subacute right MCA territory infarct, as well as left subacute infarct at left mca territory.   She was admitted with a diagnosis of a right MCA stroke. No tPA as she was outside window.  Interval HPI : Patient is sitting up in bed.  She is comfortable.  She has left a minimal hemianopsia and mild intrinsic hand muscle weakness on the left.  Her headache is resolved.  She has no new complaints.  Vital signs are stable.  Echocardiogram shows normal ejection fraction without cardiac source of embolism.  Carotid ultrasound shows no significant extracranial stenosis.  LDL cholesterol is 125 mg percent.  Hemoglobin A1c is 9.1.  MRI scan shows moderate sized subacute right MCA infarct in the right parietotemporal region with mild localized edema but without significant mass-effect.  An additional 1.5 cm  early subacute left parietal and right cerebellar infarcts also noted. Vitals:   10/13/20 0122 10/13/20 0454 10/13/20 0623 10/13/20 0855  BP: (!) 148/71 (!) 106/50 122/64 (!) 163/83  Pulse: 79 96 81 73  Resp: 18 (!) 21 18   Temp: 98.4 F (36.9 C) 98.5 F (36.9 C)  98.2 F (36.8 C)  TempSrc: Oral Oral  Oral  SpO2: 93% 99% 96% 95%  Weight:      Height:       CBC:  Recent Labs  Lab 10/11/20 0358 10/11/20 0358 10/11/20 0416 10/13/20 0151  WBC 9.3  --   --  8.6  NEUTROABS 5.3  --   --  4.5  HGB 16.1*   < > 16.7* 15.5*  HCT 49.2*   < > 49.0* 47.4*  MCV 91.8  --   --  92.4  PLT 278  --   --  259   < > = values in this interval not displayed.   Basic Metabolic Panel:  Recent Labs  Lab 10/11/20 0358 10/11/20 0358 10/11/20 0416 10/13/20 0151  NA 137   < > 140 139  K 3.5   < > 3.4* 3.9  CL 102  --   --  103  CO2 25  --   --  23  GLUCOSE 184*  --   --  148*  BUN 16  --   --  15  CREATININE 0.98  --   --  1.05*  CALCIUM 8.9  --   --  9.0  MG 1.9  --   --   --    < > = values in this interval not displayed.    Lipid Panel:  Recent Labs  Lab 10/12/20 0157  CHOL 195  TRIG 197*  HDL 31*  CHOLHDL 6.3  VLDL 39  LDLCALC 370*    HgbA1c:  Recent Labs  Lab 10/12/20 0157  HGBA1C 9.1*     IMAGING past 24 hours MR BRAIN WO CONTRAST  Result Date: 10/12/2020 CLINICAL DATA:  Follow-up examination for acute stroke. EXAM: MRI HEAD WITHOUT CONTRAST TECHNIQUE: Multiplanar, multiecho pulse sequences of the brain and surrounding structures were obtained without intravenous contrast. COMPARISON:  Prior CT and CTA from 10/11/2020. FINDINGS: Brain: Examination mildly degraded by motion artifact. Cerebral volume within normal limits for age. Patchy and confluent T2/FLAIR hyperintensity throughout the periventricular deep white matter both cerebral hemispheres most consistent with chronic small vessel ischemic disease, moderate in nature. Multiple scattered remote infarcts noted about  the cerebellum bilaterally. Moderate to large size confluent area of restricted diffusion involving the right parietotemporal region consistent with an early subacute right MCA territory infarct. Extension to involve the right periatrial white matter. Mild localized edema with mass effect on the adjacent atrium of the right lateral ventricle. No other significant regional mass effect. Susceptibility artifact at the level of the right sylvian fissure suspected be related to previously identified M2 thrombus. No other evidence for associated hemorrhage or hemorrhagic transformation. Additional 1.5 cm acute to early subacute ischemic infarct seen involving the left periatrial white matter (series 5, image 75). No associated hemorrhage mass effect. Additional 1.4 cm patchy diffusion abnormality involving the peripheral right cerebellum also consistent with acute to early subacute ischemia (series 5, image 58). No associated hemorrhage or mass effect. Few additional punctate foci of acute to subacute ischemia noted involving the right splenium (series 5, image 78 and left frontal centrum semi ovale (series 7, image 51). No associated hemorrhage. Gray-white matter differentiation otherwise maintained. No other foci of susceptibility artifact to suggest acute or chronic intracranial hemorrhage. No mass lesion or midline shift. No hydrocephalus or extra-axial fluid collection. Pituitary gland suprasellar region normal. Midline structures intact. Vascular: Major intracranial vascular flow voids maintained at the skull base. Previously identified right M2 occlusion better seen on prior CTA. Skull and upper cervical spine: Craniocervical junction within normal limits. Bone marrow signal intensity normal. No scalp soft tissue abnormality. Sinuses/Orbits: Globes and orbital soft tissues within normal limits. Mild right ethmoidal sinusitis. No significant mastoid effusion. Inner ear structures grossly normal. Other: None.  IMPRESSION: 1. Moderate to large size early subacute right MCA territory infarct involving the right parietotemporal region. Mild localized edema without significant regional mass effect. 2. Additional 1.5 cm acute to early subacute ischemic nonhemorrhagic infarcts involving the left periatrial white matter and right cerebellum. 3. Few additional punctate foci of acute to early subacute ischemia involving the right splenium and left frontal centrum semi ovale as above. 4. Underlying moderate chronic microvascular ischemic disease with multiple scattered remote infarcts about the cerebellum bilaterally. This Electronically Signed   By: Rise Mu M.D.   On: 10/12/2020 22:05    PHYSICAL EXAM  Constitutional: Appears well-developed and well-nourished.  Psych: Affect appropriate to situation - anxious and tearful Eyes: No scleral injection HENT: No OP obstrucion MSK: no joint deformities.  Cardiovascular: Normal rate and regular rhythm.  Respiratory: Effort normal, non-labored breathing GI: Soft.  No distension. There is no tenderness.  Skin: WDI  Neuro: Mental Status: Patient is awake, alert, oriented to person, place, month, year, and situation. Speech is fluent with no signs of aphasia. No hemineglect.  Cranial Nerves: II:   Pupils are  equal, round, and reactive to light. . Has dense left hemianopsia   III,IV, VI: EOMI without ptosis or diplopia.  V: Facial sensation is symmetric to temperature VII: Facial movement is symmetric.  VIII: hearing is intact to voice X: Uvula elevates symmetrically XI: Shoulder shrug is symmetric. XII: tongue is midline without atrophy or fasciculations.  Motor: Tone is normal. Bulk is normal. 5/5 strength was present in all four extremities.    Diminished fine finger movements on the left.  Orbits right over left upper extremity. Sensory: Sensation is symmetric to light touch and temperature in the arms and legs.  Deep Tendon Reflexes: 2+ and  symmetric in the biceps and patellae.   Plantars: Toes are downgoing bilaterally.   Cerebellar: FNF and HKS are intact bilaterally    ASSESSMENT/PLAN Jill Barnes is a 59 y.o. adult with history of hypertension, diabetes, dyslipidemia, recent bilateral PE off Xarelto  (reports it was cost prohibitive) who presented with two days of right frontal headache.   MRI shows stroke at right MCA territory, as well as left periatrial white matter and right cerebellum.  Stroke multiple territory infarct embolic secondary to cryptogenic stroke.  History of recent pulmonary embolism would raise possibility of paradoxical embolism.   CTA head & neck  .  1. Occlusion of a proximal right M2 branch, in keeping with the right MCA territory infarct. 2. Atheromatous change about the carotid bifurcations with associated stenoses of up to 60% bilaterally. 3. Atherosclerotic change within the carotid siphons with associated mild to moderate multifocal narrowing, left worse than right.  CT chest PE:  1. No evidence of acute abnormality. No evidence of pulmonary emboli or thoracic aortic aneurysm/definite dissection. 2. Unchanged nonspecific mild ground-glass opacities within both lungs. Previously identified consolidation/airspace opacities have resolved. 3. Coronary artery disease.    Carotid Doppler  Ordered and pending  2D Echo:              Left ventricular ejection fraction, by estimation, is 60                to 65%. The left ventricle has normal function.               Left Atrium: Left atrial size was normal in size.                IAS/Shunts: The interatrial septum was not well visualized.   Doppler BLE: ordered and pending   LDL 125  HgbA1c 9.1  VTE prophylaxis - SCD    Diet   Diet heart healthy/carb modified Room service appropriate? Yes; Fluid consistency: Thin     No antithrombotic prior to admission, now on aspirin 81 mg daily and clopidogrel 75 mg daily .  Continue  with aspirin and plavix unless LE doppler show DVT.   Therapy recommendations:   Inpatient rehab   Hypertension  Home meds:  None listed   Stable . Permissive hypertension (OK if < 160/110 but gradually normalize in 5-7 days . Long-term BP goal normotensive  Hyperlipidemia  Home meds:  atorvastin 20mg  daily,  resumed in hospital  LDL 125, goal < 70  Continue statin at discharge  Diabetes type II Uncontrolled  Home meds:  Glucotrol xl 10mg  daily with breakfast, insulin, actos 45mg  daily  HgbA1c 9.1, goal < 7.0  CBGs Recent Labs    10/12/20 2149 10/13/20 0612 10/13/20 0922  GLUCAP 227* 159* 145*      SSI  Other Stroke Risk Factors  Cigarette smoker advised to stop smoking   Obesity, Body mass index is 35.3 kg/m., BMI >/= 30 associated with increased stroke risk, recommend weight loss, diet and exercise as appropriate    Hx stroke/TIA   Migraines   Hospital day # 2 Patient had bilateral pulmonary embolism recently in the setting of acute Covid infection and discontinued anticoagulation only after a month.  She presents with embolic right MCA infarct of undetermined etiology.  Recommend ongoing work-up check lower extremity venous Doppler and carotid ultrasound.  Consider resuming anticoagulation if patient is willing to be compliant or consider warfarin if cost is an issue.  If she does not need anticoagulation anymore then consider aspirin and Plavix for 3 weeks followed by aspirin alone.  Long discussion with patient about her stroke.  She was advised not to drive till peripheral vision improves.  Discussed with Dr. Pola Corn.  Greater than 50% time during this 35-minute visit was spent on counseling and coordination of care about her embolic stroke and answering questions. Delia Heady, MD

## 2020-10-13 NOTE — Plan of Care (Signed)
  Problem: Education: Goal: Knowledge of disease or condition will improve Outcome: Progressing Goal: Knowledge of secondary prevention will improve Outcome: Progressing   Problem: Coping: Goal: Will verbalize positive feelings about self Outcome: Progressing Goal: Will identify appropriate support needs Outcome: Progressing   

## 2020-10-13 NOTE — Progress Notes (Signed)
Inpatient Rehabilitation-Admissions Coordinator   Met with pt and her brother bedside as follow up from PM&R consult. The patient confirmed she does not have an interest in CIR due to cost. Asked if she wanted me to contact financial counseling and she declined. She also declined hearing anymore information about CIR program and wants to go home as soon as she can. Confirmed with pt and her brother that she does have good support at DC from her husband, daughter, and brother.  AC will notify TOC team of preference. AC will sign off.   Raechel Ache, OTR/L  Rehab Admissions Coordinator  [phone] 10/13/2020 2:46 PM

## 2020-10-13 NOTE — Progress Notes (Signed)
Physical Therapy Treatment Patient Details Name: Jill Barnes MRN: [id number] DOB: 30-May-1961 Today's Date: 10/13/2020    History of Present Illness 59 y.o. right handed, obese white female with a PMHx significant for bilateral PE (presumed provoked secondary to covid infection in July 2021) noncompliant with anticoagulation after one month on DOAC, IDDM, HTN, COPD, hyperlipidemia, current cigarette smoking, who presented with new onset of headache and balance difficulties. Head CT without contrast showed a well demarcated hypodensity involving the posterior right temporo-occipital region, consistent with a subacute Right MCA territory stroke.    PT Comments    Patient continues to have no awareness of deficits/safety significantly impacting overall safety and mobility putting pt at increased risk for falls. Continues to demonstrate left inattention, impulsivity, bumping into things on left side and having difficulty using LUE at times without cues. Also noted to have some possible visual deficits on the left. Requires Min-Mod A for transfers/gait training with use of RW for support. Lengthy discussion with pt at end of session about reconsidering disposition. Pt crying with discussion of recommendation for CIR. Continue to recommend CIR due to above deficits/concerns. Brother present and agrees. Will follow acutely. If pt continues to refuse, recommend w/c for home for safety.    Follow Up Recommendations  CIR;Supervision for mobility/OOB;Supervision/Assistance - 24 hour     Equipment Recommendations  Wheelchair cushion (measurements PT);Wheelchair (measurements PT) (if going home to help with getting to OPPT)    Recommendations for Other Services       Precautions / Restrictions Precautions Precautions: Fall Precaution Comments: left inattention Restrictions Weight Bearing Restrictions: No    Mobility  Bed Mobility Overal bed mobility: Needs Assistance Bed Mobility: Supine to  Sit     Supine to sit: Min guard;HOB elevated     General bed mobility comments: Increased difficulty getting to EOB with use of momentum and multiple attempts to get to EOB, heavy use of rail on RUE, not using LUE despite cues and making excuses why she cannot use it.  Transfers Overall transfer level: Needs assistance Equipment used: Rolling walker (2 wheeled) Transfers: Sit to/from Stand Sit to Stand: Min assist         General transfer comment: Impulsive to rise from EOB with Min A to steady. Immediately running into counter on left side.  Ambulation/Gait Ambulation/Gait assistance: Min assist;Mod assist Gait Distance (Feet): 100 Feet Assistive device: Rolling walker (2 wheeled) Gait Pattern/deviations: Step-through pattern;Decreased stride length;Trunk flexed;Drifts right/left;Staggering left;Staggering right Gait velocity: unsafe speed   General Gait Details: Fast, unsafe gait speed with flexed trunk, tends to drift towards right side and swerve RW all over the place in the hallway in no particular pattern; also with left knee instability at times reporting foot pain? cues for RW proximity, cues to attend to left environment. Bumping into things on left, releasing RW too early and almost tripping to get back to bed without UE support.   Stairs             Wheelchair Mobility    Modified Rankin (Stroke Patients Only) Modified Rankin (Stroke Patients Only) Pre-Morbid Rankin Score: No symptoms Modified Rankin: Moderately severe disability     Balance Overall balance assessment: Needs assistance Sitting-balance support: Feet supported;No upper extremity supported Sitting balance-Leahy Scale: Good Sitting balance - Comments: supervision   Standing balance support: During functional activity Standing balance-Leahy Scale: Poor Standing balance comment: Requires UE support in standing, impulsive and unpredictable with movements.  Cognition Arousal/Alertness: Awake/alert Behavior During Therapy: Impulsive Overall Cognitive Status: Impaired/Different from baseline Area of Impairment: Attention;Safety/judgement;Awareness;Problem solving                   Current Attention Level: Sustained     Safety/Judgement: Decreased awareness of safety;Decreased awareness of deficits Awareness: Intellectual Problem Solving: Slow processing;Requires verbal cues General Comments: inattention to Lft hemibody and environment, no awareness of deficits. "I am fine" Not able to state any differences from baseline. Crying at end of session when discussing safety concerns about going home, and needing CIR.      Exercises      General Comments General comments (skin integrity, edema, etc.): Upon entering room, lines/leads are removed with pt laying in bed, sheet half off. During vision testing, pt not able to see anything in left peripheral vision in upper or lower quadrant. Brother present at end of session with discussion regarding CIR.      Pertinent Vitals/Pain Pain Assessment: No/denies pain    Home Living     Available Help at Discharge: Family;Available 24 hours/day Type of Home: House              Prior Function            PT Goals (current goals can now be found in the care plan section) Progress towards PT goals: Progressing toward goals (slowly)    Frequency    Min 4X/week      PT Plan Discharge plan needs to be updated    Co-evaluation              AM-PAC PT "6 Clicks" Mobility   Outcome Measure  Help needed turning from your back to your side while in a flat bed without using bedrails?: A Little Help needed moving from lying on your back to sitting on the side of a flat bed without using bedrails?: A Little Help needed moving to and from a bed to a chair (including a wheelchair)?: A Little Help needed standing up from a chair using your arms (e.g., wheelchair or bedside  chair)?: A Little Help needed to walk in hospital room?: A Lot Help needed climbing 3-5 steps with a railing? : A Lot 6 Click Score: 2    End of Session Equipment Utilized During Treatment: Gait belt Activity Tolerance: Patient tolerated treatment well Patient left: in bed;with call bell/phone within reach;with nursing/sitter in room;with family/visitor present (nursing present to give meds and been made aware bed alamr is not on, brother prsent) Nurse Communication: Mobility status;Other (comment) (need for CIR) PT Visit Diagnosis: Unsteadiness on feet (R26.81);Other abnormalities of gait and mobility (R26.89);Other symptoms and signs involving the nervous system (R29.898)     Time: 3244-0102 PT Time Calculation (min) (ACUTE ONLY): 22 min  Charges:  $Gait Training: 8-22 mins                     Vale Haven, PT, DPT Acute Rehabilitation Services Pager [phone] Office [phone]       Blake Divine A Lanier Ensign 10/13/2020, 10:53 AM

## 2020-10-13 NOTE — TOC Transition Note (Signed)
Transition of Care [facility]) - CM/SW Discharge Note   Patient Details  Name: Jill Barnes MRN: [id number] Date of Birth: 04-20-1961  [facility] CM/SW Contact:  Kermit Balo, RN Phone Number: 10/13/2020, 3:16 PM   Clinical Narrative:    Pt is refusing CIR. She is asking to d/c home. She is in agreement with outpatient therapy. CM place information for outpatient on the AVS.  Walker for home provided through [employer] charity services.  CM asked about a wheelchair for home but pt did not want to provide a credit card and do hardship application. CM provided Good Rx card to assist with cost of d/c medications. Spouse at the bedside and can provide transport home.   Final next level of care: OP Rehab Barriers to Discharge: Inadequate or no insurance, Barriers Unresolved (comment)   Patient Goals and CMS Choice     Choice offered to / list presented to : Patient  Discharge Placement                       Discharge Plan and Services                DME Arranged: Walker rolling DME Agency: [employer] Date DME Agency Contacted: 10/13/20   Representative spoke with at DME Agency: [facility]--charity services            Social Determinants of Health (SDOH) Interventions     Readmission Risk Interventions No flowsheet data found.

## 2020-10-13 NOTE — Evaluation (Signed)
Speech Language Pathology Evaluation Patient Details Name: Jill Barnes MRN: [id number] DOB: December 09, 1960 Today's Date: 10/13/2020 Time: 7867-6720 SLP Time Calculation (min) (ACUTE ONLY): 18 min  Problem List:  Patient Active Problem List   Diagnosis Date Noted  . Ischemic stroke (HCC) 10/11/2020  . CVA (cerebral vascular accident) (HCC) 10/11/2020  . Acute diastolic CHF (congestive heart failure) (HCC) 06/29/2020  . Obesity (BMI 30-39.9) 06/28/2020  . Acute deep vein thrombosis (DVT) of calf muscle vein of right lower extremity (HCC) 06/28/2020  . Pneumonia due to COVID-19 virus 06/28/2020  . Tobacco abuse 06/28/2020  . Diabetes mellitus type 2, uncontrolled, with complications (HCC) 06/28/2020  . Bilateral pulmonary embolism (HCC) 06/27/2020  . Respiratory failure (HCC) 06/27/2020  . Pulmonary embolism (HCC) 06/27/2020  . Acute hypoxemic respiratory failure due to COVID-19 (HCC) 06/20/2020  . Type 2 diabetes mellitus without complication (HCC) 06/20/2020  . Hypokalemia 06/20/2020  . Acute respiratory failure with hypoxia (HCC) 06/19/2020  . Hypoxia 12/15/2017  . COPD exacerbation (HCC) 12/15/2017  . Tachycardia 12/15/2017  . Nicotine dependence 12/15/2017   Past Medical History:  Past Medical History:  Diagnosis Date  . COPD (chronic obstructive pulmonary disease) (HCC)   . Diabetes mellitus without complication (HCC)   . Kidney stone    Past Surgical History:  Past Surgical History:  Procedure Laterality Date  . ABDOMINAL HYSTERECTOMY    . BLADDER SURGERY     HPI:  Jill Barnes is a 59 y.o. adult with medical history significant of PE noncompliant with anticoagulation, IDDM, HTN, hyperlipidemia, cigarette smoke, presented with new onset of headache and balance issue. Pt was hospitalized 08/21 and was discharged on Xarelto for new onset of bilateral PE (considered to be provoked PE secondary to Covid infection).  CTH revealed subacute R MCA territory stroke and  multiple remote bilateral cerebellar infarcts.    Assessment / Plan / Recommendation Clinical Impression  Pt was seen for SLE. She was cooperative, but rather distractible and impulsive throughout the evaluation.  The SLUMS was administered and she scored 21/30, revealing impariments in the areas of attention, awareness, and executive function. She demonstrated difficulty focusing on and sustaining her attention to simple tasks or conversation. She also demonstrated impairments to both intellectual and emergent awareness. Her executive function impairment is characterized by reduced reasoning, sequencing, self monitoring, and self correcting. The pt also demonstrates significant L inattention. During a clock drawing task the pt only wrote numbers 12-6 on the R side and reported that there were no other numbers on a clock. When prompted to include numbers 7-11, she also wrote them on the R side.The impairments and behaviors observed in the evaluation raise concern for her overall safety/judgement. The pt would benefit from ongoing SLP treatment in order to address these concerns. SLP will continue to f/u acutely.     SLP Assessment  SLP Recommendation/Assessment: Patient needs continued Speech Lanaguage Pathology Services SLP Visit Diagnosis: Cognitive communication deficit (R41.841)    Follow Up Recommendations  24 hour supervision/assistance;Outpatient SLP    Frequency and Duration min 2x/week  2 weeks      SLP Evaluation Cognition  Overall Cognitive Status: Impaired/Different from baseline Arousal/Alertness: Awake/alert Orientation Level: Oriented X4 Attention: Focused;Sustained Focused Attention: Impaired Focused Attention Impairment: Verbal basic;Functional basic Sustained Attention: Impaired Sustained Attention Impairment: Verbal basic;Functional basic Memory: Appears intact Awareness: Impaired Awareness Impairment: Emergent impairment;Intellectual impairment Problem Solving:  Impaired Problem Solving Impairment: Functional basic;Verbal basic Executive Function: Reasoning;Self Correcting;Self Monitoring;Sequencing Behaviors: Impulsive;Restless Safety/Judgment: Impaired  Comprehension  Auditory Comprehension Overall Auditory Comprehension: Appears within functional limits for tasks assessed Yes/No Questions: Within Functional Limits Commands: Within Functional Limits Conversation: Simple Visual Recognition/Discrimination Discrimination: Not tested Reading Comprehension Reading Status: Not tested    Expression Expression Primary Mode of Expression: Verbal Verbal Expression Overall Verbal Expression: Appears within functional limits for tasks assessed Initiation: No impairment Automatic Speech: Name;Social Response;Day of week Level of Generative/Spontaneous Verbalization: Conversation Naming: No impairment Written Expression Dominant Hand: Right Written Expression: Within Functional Limits   Oral / Motor  Oral Motor/Sensory Function Overall Oral Motor/Sensory Function: Within functional limits Motor Speech Overall Motor Speech: Appears within functional limits for tasks assessed Respiration: Within functional limits Phonation: Normal Resonance: Within functional limits Articulation: Within functional limitis Intelligibility: Intelligible Motor Planning: Not tested Motor Speech Errors: Not applicable   GO                    Royetta Crochet 10/13/2020, 10:23 AM

## 2020-10-13 NOTE — Progress Notes (Signed)
Patient was placed in low bed for safety. Will continue safety precautions with mats on floor.

## 2020-10-13 NOTE — Progress Notes (Signed)
VASCULAR LAB    Right lower extremity venous duplex has been performed.  See CV proc for preliminary results.   Olly Shiner, RVT 10/13/2020, 2:08 PM

## 2020-10-13 NOTE — Progress Notes (Signed)
VASCULAR LAB PRELIMINARY   Bilateral lower extremity venous duplex completed.  See CV proc for preliminary results.  Sherren Kerns, RVS

## 2020-10-13 NOTE — Consult Note (Signed)
Physical Medicine and Rehabilitation Consult Reason for Consult: Headache with gait instability Referring Physician: [facility]  HPI: Jill Barnes is a 59 y.o. right-handed adult with history of hypertension, COPD/tobacco abuse, diabetes mellitus, hyperlipidemia, obesity with BMI 35.30, bilateral pulmonary emboli presumed provoked secondary Covid infection July 2021 noncompliant with anticoagulation.  Per chart review she lives with spouse and family.  1 level home 3 steps to entry.  Reportedly independent prior to admission.  Presented 10/11/2020 with headache and gait instability.  CT of the head showed well demarcated parenchymal hypodensity involving the posterior right temporal occipital region consistent with subacute right MCA territory infarction.  No associated hemorrhage or significant regional mass-effect.  CT angiogram of head and neck occlusion of proximal right M2 branch.Atheromatous change about the carotid bifurcations with associated stenosis of 60% bilaterally.  Patient did not receive TPA.  MRI showed moderate to large size early subacute right MCA territory infarct mild localized edema additional 1.5 cm acute early subacute ischemic nonhemorrhagic infarct left periatrial white matter and right cerebellum with few additional punctate foci of acute early subacute ischemia involving the right splenium and left frontal centrum semiovale.  Carotid duplex with 1 to 39% ICA stenosis.  Echocardiogram with ejection fraction of 60 to 65% no wall motion abnormalities grade 2 diastolic dysfunction.  Admission chemistries were unremarkable aside glucose 184, urinalysis negative nitrite, sedimentation rate of 10, hemoglobin A1c 9.1.  Neurology consulted currently maintained on aspirin and Plavix for CVA prophylaxis.  Subcutaneous Lovenox for DVT prophylaxis.  Tolerating a regular consistency diet.  Therapy evaluations completed with recommendations of physical medicine rehab consult.  Review of  Systems  Constitutional: Negative for chills and fever.  HENT: Negative for hearing loss.   Eyes: Negative for blurred vision and double vision.  Respiratory: Negative for cough and shortness of breath.   Cardiovascular: Positive for leg swelling. Negative for chest pain and palpitations.  Gastrointestinal: Positive for constipation. Negative for heartburn, nausea and vomiting.  Genitourinary: Negative for dysuria, flank pain and hematuria.  Musculoskeletal: Positive for joint pain and myalgias.  Skin: Negative for rash.  Neurological: Positive for dizziness, weakness and headaches.  All other systems reviewed and are negative.  Past Medical History:  Diagnosis Date  . COPD (chronic obstructive pulmonary disease) (HCC)   . Diabetes mellitus without complication (HCC)   . Kidney stone    Past Surgical History:  Procedure Laterality Date  . ABDOMINAL HYSTERECTOMY    . BLADDER SURGERY     Family History  Problem Relation Age of Onset  . Heart failure Father    Social History:  reports that she has been smoking. She has been smoking about 1.00 pack per day. She has never used smokeless tobacco. She reports that she does not drink alcohol and does not use drugs. Allergies:  Allergies  Allergen Reactions  . Sulfa Antibiotics Shortness Of Breath and Swelling    All-over swelling  . Penicillins Rash   Medications Prior to Admission  Medication Sig Dispense Refill  . atorvastatin (LIPITOR) 20 MG tablet Take 1 tablet (20 mg total) by mouth daily. 30 tablet 0  . glipiZIDE (GLUCOTROL XL) 10 MG 24 hr tablet Take 1 tablet (10 mg total) by mouth daily with breakfast. 30 tablet 0  . insulin NPH Human (NOVOLIN N) 100 UNIT/ML injection Inject 20 Units into the skin 2 (two) times daily before a meal.    . pantoprazole (PROTONIX) 40 MG tablet Take 1 tablet (40 mg total)  by mouth daily. 30 tablet 0  . Phenylephrine-APAP-guaiFENesin (TYLENOL SINUS SEVERE PO) Take 1 capsule by mouth once.    .  pioglitazone (ACTOS) 45 MG tablet Take 1 tablet (45 mg total) by mouth daily. 30 tablet 0  . albuterol (PROVENTIL HFA;VENTOLIN HFA) 108 (90 Base) MCG/ACT inhaler Inhale 2 puffs into the lungs every 6 (six) hours as needed for wheezing or shortness of breath. (Patient not taking: Reported on 10/11/2020) 1 Inhaler 2  . rivaroxaban (XARELTO) 20 MG TABS tablet Take 1 tablet (20 mg total) by mouth daily with supper. (Patient not taking: Reported on 10/11/2020) 30 tablet 0    Home: Home Living Family/patient expects to be discharged to:: Private residence Living Arrangements: Spouse/significant other, Children, Other relatives Available Help at Discharge: Family, Available 24 hours/day Type of Home: House Home Access: Stairs to enter [employer] of Steps: 3 Entrance Stairs-Rails: Right, Left, Can reach both Home Layout: One level Bathroom Shower/Tub: Engineer, manufacturing systems: Standard Home Equipment: Environmental consultant - 2 wheels  Functional History: Prior Function Level of Independence: Independent Comments: working part time for a [employer] Functional Status:  Mobility: Bed Mobility Overal bed mobility: Needs Assistance Bed Mobility: Supine to Sit Supine to sit: Min guard, HOB elevated General bed mobility comments: Increased difficulty getting to EOB with use of momentum and multiple attempts to get to EOB, heavy use of rail on RUE, not using LUE despite cues and making excuses why she cannot use it. Transfers Overall transfer level: Needs assistance Equipment used: Rolling walker (2 wheeled) Transfers: Sit to/from Stand Sit to Stand: Min assist General transfer comment: Impulsive to rise from EOB with Min A to steady. Immediately running into counter on left side. Ambulation/Gait Ambulation/Gait assistance: Min assist, Mod assist Gait Distance (Feet): 100 Feet Assistive device: Rolling walker (2 wheeled) Gait Pattern/deviations: Step-through pattern, Decreased stride length,  Trunk flexed, Drifts right/left, Staggering left, Staggering right General Gait Details: Fast, unsafe gait speed with flexed trunk, tends to drift towards right side and swerve RW all over the place in the hallway in no particular pattern; also with left knee instability at times reporting foot pain? cues for RW proximity, cues to attend to left environment. Bumping into things on left, releasing RW too early and almost tripping to get back to bed without UE support. Gait velocity: unsafe speed Gait velocity interpretation: <1.8 ft/sec, indicate of risk for recurrent falls    ADL: ADL Overall ADL's : Needs assistance/impaired Eating/Feeding: Set up Grooming: Minimal assistance Upper Body Dressing : Min guard Lower Body Dressing: Minimal assistance Toilet Transfer: Minimal assistance, RW Toileting- Clothing Manipulation and Hygiene: Min guard General ADL Comments: pt with elevated HR during session of up to 136 with seated EOB tasks limiting full self care routine  Cognition: Cognition Overall Cognitive Status: Impaired/Different from baseline Arousal/Alertness: Awake/alert Orientation Level: Oriented X4 Attention: Focused, Sustained Focused Attention: Impaired Focused Attention Impairment: Verbal basic, Functional basic Sustained Attention: Impaired Sustained Attention Impairment: Verbal basic, Functional basic Memory: Appears intact Awareness: Impaired Awareness Impairment: Emergent impairment, Intellectual impairment Problem Solving: Impaired Problem Solving Impairment: Functional basic, Verbal basic Executive Function: Reasoning, Self Correcting, Self Monitoring, Sequencing Behaviors: Impulsive, Restless Safety/Judgment: Impaired Cognition Arousal/Alertness: Awake/alert Behavior During Therapy: Impulsive Overall Cognitive Status: Impaired/Different from baseline Area of Impairment: Attention, Safety/judgement, Awareness, Problem solving Current Attention Level:  Sustained Safety/Judgement: Decreased awareness of safety, Decreased awareness of deficits Awareness: Intellectual Problem Solving: Slow processing, Requires verbal cues General Comments: inattention to Lft hemibody and environment, no awareness of  deficits. "I am fine" Not able to state any differences from baseline. Crying at end of session when discussing safety concerns about going home, and needing CIR.  Blood pressure (!) 163/83, pulse 73, temperature 98.2 F (36.8 C), temperature source Oral, resp. rate 18, height  (1.676 m), weight 99.2 kg, SpO2 95 %. Physical Exam General: Alert and oriented x 3, No apparent distress HEENT: Visual disturbances, floaters  Neck: Supple without JVD or lymphadenopathy Heart: Reg rate and rhythm. No murmurs rubs or gallops Chest: CTA bilaterally without wheezes, rales, or rhonchi; no distress Abdomen: Soft, non-tender, non-distended, bowel sounds positive. Extremities: No clubbing, cyanosis, or edema. Pulses are 2+ Skin: Clean and intact without signs of breakdown Neuro: Patient is alert in no acute distress.  Makes eye contact with examiner and follows commands.  Oriented x3. Strength and sensation are intact.  Psych: Pt's affect is appropriate. Pt is cooperative  Results for orders placed or performed during the hospital encounter of 10/11/20 (from the past 24 hour(s))  Glucose, capillary     Status: Abnormal   Collection Time: 10/12/20  4:57 PM  Result Value Ref Range   Glucose-Capillary 223 (H) 70 - 99 mg/dL  Glucose, capillary     Status: Abnormal   Collection Time: 10/12/20  9:49 PM  Result Value Ref Range   Glucose-Capillary 227 (H) 70 - 99 mg/dL   Comment 1 Notify RN    Comment 2 Document in Chart   Basic metabolic panel     Status: Abnormal   Collection Time: 10/13/20  1:51 AM  Result Value Ref Range   Sodium 139 135 - 145 mmol/L   Potassium 3.9 3.5 - 5.1 mmol/L   Chloride 103 98 - 111 mmol/L   CO2 23 22 - 32 mmol/L   Glucose,  Bld 148 (H) 70 - 99 mg/dL   BUN 15 6 - 20 mg/dL   Creatinine, Ser 9.81 (H) 0.44 - 1.00 mg/dL   Calcium 9.0 8.9 - 19.1 mg/dL   GFR, Estimated >47 >82 mL/min   Anion gap 13 5 - 15  CBC with Differential/Platelet     Status: Abnormal   Collection Time: 10/13/20  1:51 AM  Result Value Ref Range   WBC 8.6 4.0 - 10.5 K/uL   RBC 5.13 (H) 3.87 - 5.11 MIL/uL   Hemoglobin 15.5 (H) 12.0 - 15.0 g/dL   HCT 95.6 (H) 36 - 46 %   MCV 92.4 80.0 - 100.0 fL   MCH 30.2 26.0 - 34.0 pg   MCHC 32.7 30.0 - 36.0 g/dL   RDW 21.3 08.6 - 57.8 %   Platelets 259 150 - 400 K/uL   nRBC 0.0 0.0 - 0.2 %   Neutrophils Relative % 53 %   Neutro Abs 4.5 1.7 - 7.7 K/uL   Lymphocytes Relative 33 %   Lymphs Abs 2.8 0.7 - 4.0 K/uL   Monocytes Relative 10 %   Monocytes Absolute 0.8 0.1 - 1.0 K/uL   Eosinophils Relative 3 %   Eosinophils Absolute 0.3 0.0 - 0.5 K/uL   Basophils Relative 1 %   Basophils Absolute 0.1 0.0 - 0.1 K/uL   Immature Granulocytes 0 %   Abs Immature Granulocytes 0.03 0.00 - 0.07 K/uL  Glucose, capillary     Status: Abnormal   Collection Time: 10/13/20  6:12 AM  Result Value Ref Range   Glucose-Capillary 159 (H) 70 - 99 mg/dL  Glucose, capillary     Status: Abnormal   Collection Time:  10/13/20  9:22 AM  Result Value Ref Range   Glucose-Capillary 145 (H) 70 - 99 mg/dL   Comment 1 Notify RN    Comment 2 Document in Chart    MR BRAIN WO CONTRAST  Result Date: 10/12/2020 CLINICAL DATA:  Follow-up examination for acute stroke. EXAM: MRI HEAD WITHOUT CONTRAST TECHNIQUE: Multiplanar, multiecho pulse sequences of the brain and surrounding structures were obtained without intravenous contrast. COMPARISON:  Prior CT and CTA from 10/11/2020. FINDINGS: Brain: Examination mildly degraded by motion artifact. Cerebral volume within normal limits for age. Patchy and confluent T2/FLAIR hyperintensity throughout the periventricular deep white matter both cerebral hemispheres most consistent with chronic small  vessel ischemic disease, moderate in nature. Multiple scattered remote infarcts noted about the cerebellum bilaterally. Moderate to large size confluent area of restricted diffusion involving the right parietotemporal region consistent with an early subacute right MCA territory infarct. Extension to involve the right periatrial white matter. Mild localized edema with mass effect on the adjacent atrium of the right lateral ventricle. No other significant regional mass effect. Susceptibility artifact at the level of the right sylvian fissure suspected be related to previously identified M2 thrombus. No other evidence for associated hemorrhage or hemorrhagic transformation. Additional 1.5 cm acute to early subacute ischemic infarct seen involving the left periatrial white matter (series 5, image 75). No associated hemorrhage mass effect. Additional 1.4 cm patchy diffusion abnormality involving the peripheral right cerebellum also consistent with acute to early subacute ischemia (series 5, image 58). No associated hemorrhage or mass effect. Few additional punctate foci of acute to subacute ischemia noted involving the right splenium (series 5, image 78 and left frontal centrum semi ovale (series 7, image 51). No associated hemorrhage. Gray-white matter differentiation otherwise maintained. No other foci of susceptibility artifact to suggest acute or chronic intracranial hemorrhage. No mass lesion or midline shift. No hydrocephalus or extra-axial fluid collection. Pituitary gland suprasellar region normal. Midline structures intact. Vascular: Major intracranial vascular flow voids maintained at the skull base. Previously identified right M2 occlusion better seen on prior CTA. Skull and upper cervical spine: Craniocervical junction within normal limits. Bone marrow signal intensity normal. No scalp soft tissue abnormality. Sinuses/Orbits: Globes and orbital soft tissues within normal limits. Mild right ethmoidal  sinusitis. No significant mastoid effusion. Inner ear structures grossly normal. Other: None. IMPRESSION: 1. Moderate to large size early subacute right MCA territory infarct involving the right parietotemporal region. Mild localized edema without significant regional mass effect. 2. Additional 1.5 cm acute to early subacute ischemic nonhemorrhagic infarcts involving the left periatrial white matter and right cerebellum. 3. Few additional punctate foci of acute to early subacute ischemia involving the right splenium and left frontal centrum semi ovale as above. 4. Underlying moderate chronic microvascular ischemic disease with multiple scattered remote infarcts about the cerebellum bilaterally. This Electronically Signed   By: Rise Mu M.D.   On: 10/12/2020 22:05   ECHOCARDIOGRAM COMPLETE  Result Date: 10/12/2020    ECHOCARDIOGRAM REPORT   Patient Name:   Jill Barnes Date of Exam: 10/12/2020 Medical Rec #:  [id number]        Height:       66.0 in Accession #:    [id number]       Weight:       218.7 lb Date of Birth:  [birth year]        BSA:          2.077 m Patient Age:    59 years  BP:           140/72 mmHg Patient Gender: F                HR:           74 bpm. Exam Location:  Inpatient Procedure: 2D Echo Indications:    TIA 435.9  History:        Patient has prior history of Echocardiogram examinations, most                 recent 06/28/2020. CHF, COPD; Risk Factors:Diabetes and Current                 Smoker.  Sonographer:    Delcie Roch Referring Phys: [id number] PING T ZHANG IMPRESSIONS  1. Left ventricular ejection fraction, by estimation, is 60 to 65%. The left ventricle has normal function. The left ventricle has no regional wall motion abnormalities. Left ventricular diastolic parameters are consistent with Grade II diastolic dysfunction (pseudonormalization). Elevated left ventricular end-diastolic pressure.  2. Right ventricular systolic function is normal. The right  ventricular size is normal.  3. The mitral valve is abnormal. Trivial mitral valve regurgitation. No evidence of mitral stenosis.  4. The aortic valve is normal in structure. Aortic valve regurgitation is not visualized. Mild to moderate aortic valve sclerosis/calcification is present, without any evidence of aortic stenosis.  5. The inferior vena cava is normal in size with greater than 50% respiratory variability, suggesting right atrial pressure of 3 mmHg. FINDINGS  Left Ventricle: Left ventricular ejection fraction, by estimation, is 60 to 65%. The left ventricle has normal function. The left ventricle has no regional wall motion abnormalities. The left ventricular internal cavity size was normal in size. There is  no left ventricular hypertrophy. Left ventricular diastolic parameters are consistent with Grade II diastolic dysfunction (pseudonormalization). Elevated left ventricular end-diastolic pressure. Right Ventricle: The right ventricular size is normal. No increase in right ventricular wall thickness. Right ventricular systolic function is normal. Left Atrium: Left atrial size was normal in size. Right Atrium: Right atrial size was normal in size. Pericardium: There is no evidence of pericardial effusion. Mitral Valve: The mitral valve is abnormal. There is moderate thickening of the mitral valve leaflet(s). There is moderate calcification of the mitral valve leaflet(s). Mild mitral annular calcification. Trivial mitral valve regurgitation. No evidence of  mitral valve stenosis. Tricuspid Valve: The tricuspid valve is normal in structure. Tricuspid valve regurgitation is trivial. No evidence of tricuspid stenosis. Aortic Valve: The aortic valve is normal in structure. Aortic valve regurgitation is not visualized. Mild to moderate aortic valve sclerosis/calcification is present, without any evidence of aortic stenosis. Pulmonic Valve: The pulmonic valve was normal in structure. Pulmonic valve regurgitation  is not visualized. No evidence of pulmonic stenosis. Aorta: The aortic root is normal in size and structure. Venous: The inferior vena cava is normal in size with greater than 50% respiratory variability, suggesting right atrial pressure of 3 mmHg. IAS/Shunts: The interatrial septum was not well visualized.  LEFT VENTRICLE PLAX 2D LVIDd:         4.70 cm  Diastology LVIDs:         2.90 cm  LV e' medial:    6.85 cm/s LV PW:         1.10 cm  LV E/e' medial:  21.5 LV IVS:        1.00 cm  LV e' lateral:   9.68 cm/s LVOT diam:     2.20 cm  LV  E/e' lateral: 15.2 LV SV:         80 LV SV Index:   38 LVOT Area:     3.80 cm  RIGHT VENTRICLE RV S prime:     14.60 cm/s TAPSE (M-mode): 2.2 cm LEFT ATRIUM             Index       RIGHT ATRIUM           Index LA diam:        3.20 cm 1.54 cm/m  RA Area:     12.20 cm LA Vol (A2C):   52.8 ml 25.42 ml/m RA Volume:   26.10 ml  12.56 ml/m LA Vol (A4C):   52.1 ml 25.08 ml/m LA Biplane Vol: 55.4 ml 26.67 ml/m  AORTIC VALVE LVOT Vmax:   113.00 cm/s LVOT Vmean:  75.700 cm/s LVOT VTI:    0.210 m  AORTA Ao Root diam: 2.80 cm Ao Asc diam:  3.00 cm MITRAL VALVE MV Area (PHT): 2.37 cm     SHUNTS MV Decel Time: 320 msec     Systemic VTI:  0.21 m MV E velocity: 147.00 cm/s  Systemic Diam: 2.20 cm MV A velocity: 149.00 cm/s MV E/A ratio:  0.99 Charlton Haws MD Electronically signed by Charlton Haws MD Signature Date/Time: 10/12/2020/10:30:25 AM    Final    VAS US CAROTID  Result Date: 10/12/2020 Carotid Arterial Duplex Study Indications:       CVA and stenosis shown on ct. Risk Factors:      Diabetes. Comparison Study:  no prior Performing Technologist: Blanch Media RVS  Examination Guidelines: A complete evaluation includes B-mode imaging, spectral Doppler, color Doppler, and power Doppler as needed of all accessible portions of each vessel. Bilateral testing is considered an integral part of a complete examination. Limited examinations for reoccurring indications may be performed as  noted.  Right Carotid Findings: +----------+--------+--------+--------+------------------+--------+           PSV cm/sEDV cm/sStenosisPlaque DescriptionComments +----------+--------+--------+--------+------------------+--------+ CCA Prox  103     19              heterogenous               +----------+--------+--------+--------+------------------+--------+ CCA Distal65      17              heterogenous               +----------+--------+--------+--------+------------------+--------+ ICA Prox  70      25      1-39%   heterogenous               +----------+--------+--------+--------+------------------+--------+ ICA Distal80      29                                         +----------+--------+--------+--------+------------------+--------+ ECA       172     23                                         +----------+--------+--------+--------+------------------+--------+ +----------+--------+-------+--------+-------------------+           PSV cm/sEDV cmsDescribeArm Pressure (mmHG) +----------+--------+-------+--------+-------------------+ [id number]                                         +----------+--------+-------+--------+-------------------+ +---------+--------+--+--------+--+---------+  VertebralPSV cm/s49EDV cm/s16Antegrade +---------+--------+--+--------+--+---------+  Left Carotid Findings: +----------+--------+--------+--------+------------------+--------+           PSV cm/sEDV cm/sStenosisPlaque DescriptionComments +----------+--------+--------+--------+------------------+--------+ CCA Prox  88      21              heterogenous               +----------+--------+--------+--------+------------------+--------+ CCA Distal53      13              heterogenous               +----------+--------+--------+--------+------------------+--------+ ICA Prox  103     33      1-39%   heterogenous                +----------+--------+--------+--------+------------------+--------+ ICA Distal90      32                                         +----------+--------+--------+--------+------------------+--------+ ECA       120     13                                         +----------+--------+--------+--------+------------------+--------+ +----------+--------+--------+--------+-------------------+           PSV cm/sEDV cm/sDescribeArm Pressure (mmHG) +----------+--------+--------+--------+-------------------+ [id number]                                          +----------+--------+--------+--------+-------------------+ +---------+--------+--+--------+--+---------+ VertebralPSV cm/s44EDV cm/s13Antegrade +---------+--------+--+--------+--+---------+   Summary: Right Carotid: Velocities in the right ICA are consistent with a 1-39% stenosis. Left Carotid: Velocities in the left ICA are consistent with a 1-39% stenosis. Vertebrals: Bilateral vertebral arteries demonstrate antegrade flow. *See table(s) above for measurements and observations.  Electronically signed by Waverly Ferrari MD on 10/12/2020 at 6:23:08 PM.    Final     Assessment/Plan: -Refusing CIR due to lack of insurance and cost of care here.  -Ambulating Min-Mod A 100 feet with RW: should be able to d/c home with husband. 24/7 supervision recommended given impulsitivity and impaired gait.   Mcarthur Rossetti Angiulli, PA-C 10/13/2020   I have personally performed a face to face diagnostic evaluation, including, but not limited to relevant history and physical exam findings, of this patient and developed relevant assessment and plan.  Additionally, I have reviewed and concur with the physician assistant's documentation above.  Sula Soda, MD

## 2020-10-13 NOTE — Progress Notes (Signed)
Inpatient Rehab Admissions Coordinator Note:   Per PT/OT recommendations, pt was screened for CIR candidacy by Wolfgang Phoenix, MS, CCC-SLP.  At this time we are recommending an inpatient rehab consult.  AC will place consult order per protocol.  Please contact me with questions.    Wolfgang Phoenix, MS, CCC-SLP Admissions Coordinator [phone] 10/13/20 12:41 PM

## 2020-10-13 NOTE — Discharge Summary (Signed)
Physician Discharge Summary  Jill Barnes ZOX:[id number] DOB: 10-14-1961 DOA: 10/11/2020  PCP: Koren Shiver, DO  Admit date: 10/11/2020 Discharge date: 10/13/2020  Admitted From: Home Discharge disposition: Home with outpatient PT   Code Status: Full Code  Diet Recommendation: Cardiac diet  Discharge Diagnosis:   Active Problems:   Ischemic stroke [facility])   CVA (cerebral vascular accident) (HCC)   History of Present Illness / Brief narrative:  Jill Barnes is a 60 y.o. adult with PMH significant for pulmonary embolism August 2021 (presumed provoked secondary to Covid infection in July 2021), noncompliant with anticoagulation, DM 2, hypertension, hyperlipidemia, smoking. On 10/11/2020, patient presented to the ED with nonremitting new onset headache and balance difficulties. Headache started 2 days prior to presentation, woke her from her sleep, sharp like an ice pick into right frontal area associated with photophobia.  Patient was hospitalized August this year and was discharged on Xarelto for new onset of bilateral PE (was considered to be provoked PE secondary to Covid infection in July 2021). Patient however finished 30days of provided supply of Xarelto and stopped taking because of financial issue.   In the ED, hemodynamically stable Labs unremarkable. CT head showed a well-demarcated parenchymal hypodensity involving the posterior right temporal occipital region consistent with subacute right MCA territory infarct without any associated hemorrhage or mass-effect.  She also had evidence of multiple remote bilateral cerebellar infarcts and chronic small vessel disease. CTA head and neck showed occlusion of her proximal right M2 branch and also 60% stenosis of carotid bifurcation bilaterally. CT angio of chest did not show any evidence of pulmonary embolism. Neurology consultation was obtained. Patient was admitted to hospital service for stroke  work-up.  Subjective:  Patient was seen and examined this morning. [name].  Complains of headache being persistent. Chart reviewed Remains hemodynamically stable Labs this morning with HDL low at 31, LDL elevated to 125, hemoglobin A1c elevated to 9.1  Hospital Course:  Acute right MCA territory stroke -Presented with nonremitting headache and loss of balance. -Imagings as above showing acute right MCA territory stroke as well as 60% stenosis of carotid bifurcation bilaterally -Stroke work-up initiated -Echocardiogram obtained.  EF 60 to 65% with no regional wall motion abnormalities, grade 2 diastolic dysfunction.  No cardiac source of embolism. -HDL 31, LDL 125, A1c 9.1 -Continue to monitor in telemetry -PT/OT/ST eval. -Neurology following. -Patient was on Lipitor 20 mg daily at home. She has now been started on aspirin 81 mg daily and Plavix 75 mg daily.  Bilateral carotid artery stenosis -60% stenosis bilaterally at the carotid bifurcation -However, carotid duplex showed less than 40% stenosis on both carotid arteries.  Recent pulmonary embolism -Patient was hospitalized August this year and was discharged on Xarelto for new onset of bilateral PE (was considered to be provoked PE secondary to Covid infection in July 2021). Patient however finished 30days of provided supply of Xarelto and stopped taking because of financial issue.  -Ultrasound duplex of lower extremities at that time had shown Right leg DVT. -CT angio of chest obtained on this admission did not show any remnants of pulmonary embolism.  -DVT scan of lower extremities were repeated today and are negative as well.    Type II diabetes mellitus -A1c 9.1 on 11/21. -Home meds include Novolin NPH 20 units BID, glipizide 10 mg daily, Actos 45 mg daily. -Resume home regimen post discharge. Recent Labs  Lab 10/12/20 1657 10/12/20 2149 10/13/20 0612 10/13/20 8119 10/13/20 1338  GLUCAP 223* 227* 159* 145* 215*   Essential hypertension -Not on meds at home. -Blood pressure elevated to 168 this morning.  I will start on lisinopril 5 mg daily at discharge.  Nicotine dependence -Nicotine patch ordered  Stable for discharge home today.  Wound care:    Discharge Exam:   Vitals:   10/13/20 0454 10/13/20 0623 10/13/20 0855 10/13/20 1300  BP: (!) 106/50 122/64 (!) 163/83 123/76  Pulse: 96 81 73 76  Resp: (!) 21 18  18   Temp: 98.5 F (36.9 C)  98.2 F (36.8 C) 98.6 F (37 C)  TempSrc: Oral  Oral Oral  SpO2: 99% 96% 95% 95%  Weight:      Height:        Body mass index is 35.3 kg/m.  General exam: Not in physical distress Skin: No rashes, lesions or ulcers. HEENT: Atraumatic, normocephalic, no obvious bleeding Lungs: Clear to auscultation bilaterally CVS: Regular rate and rhythm, no murmur GI/Abd soft, nontender, nondistended, bowel sound present CNS: Alert, awake, oriented x3 Psychiatry: Mood appropriate Extremities: No pedal edema, no calf tenderness  Follow ups:   Discharge Instructions    Ambulatory referral to Occupational Therapy   Complete by: As directed    Ambulatory referral to Physical Therapy   Complete by: As directed    Diet - low sodium heart healthy   Complete by: As directed    Diet - low sodium heart healthy   Complete by: As directed    Diet Carb Modified   Complete by: As directed    Diet Carb Modified   Complete by: As directed    Increase activity slowly   Complete by: As directed    Increase activity slowly   Complete by: As directed       Follow-up Information    Koren ShiverMasneri, Shannon M, DO Follow up.   Specialty: Family Medicine Contact information: [address] [place] [id number] [phone]        [facility] Follow up.   Contact information: [address]     [address] [place] [phone]              Recommendations for  Outpatient Follow-Up:   1. Follow-up with PCP as an outpatient 2. Follow-up with neurology as an outpatient  Discharge Instructions:  Follow with Primary MD Koren ShiverMasneri, Shannon M, DO in 7 days   Get CBC and BMP checked in next visit within 1 week by PCP or SNF MD ( we routinely change or add medications that can affect your baseline labs and fluid status, therefore we recommend that you get the mentioned basic workup next visit with your PCP, your PCP may decide not to get them or add new tests based on their clinical decision)  On your next visit with your PCP, please Get Medicines reviewed and adjusted.  Please request your PCP  to go over all Hospital Tests and Procedure/Radiological results at the follow up, please get all Hospital records sent to your Prim MD by signing hospital release before you go home.  Activity: As tolerated with Full fall precautions use walker/cane & assistance as needed  For Heart failure patients - Check your Weight same time everyday, if you gain over 2 pounds, or you develop in leg swelling, experience more shortness of breath or chest pain, call your Primary MD immediately. Follow Cardiac Low Salt Diet and 1.5 lit/day fluid restriction.  If you have smoked or chewed Tobacco  in the last 2 yrs please stop smoking, stop any regular Alcohol  and or any Recreational drug use.  If you experience worsening of your admission symptoms, develop shortness of breath, life threatening emergency, suicidal or homicidal thoughts you must seek medical attention immediately by calling 911 or calling your MD immediately  if symptoms less severe.  You Must read complete instructions/literature along with all the possible adverse reactions/side effects for all the Medicines you take and that have been prescribed to you. Take any new Medicines after you have completely understood and accpet all the possible adverse reactions/side effects.   Do not drive, operate heavy machinery,  perform activities at heights, swimming or participation in water activities or provide baby sitting services if your were admitted for syncope or siezures until you have seen by Primary MD or a Neurologist and advised to do so again.  Do not drive when taking Pain medications.  Do not take more than prescribed Pain, Sleep and Anxiety Medications  Wear Seat belts while driving.   Please note You were cared for by a hospitalist during your hospital stay. If you have any questions about your discharge medications or the care you received while you were in the hospital after you are discharged, you can call the unit and asked to speak with the hospitalist on call if the hospitalist that took care of you is not available. Once you are discharged, your primary care physician will handle any further medical issues. Please note that NO REFILLS for any discharge medications will be authorized once you are discharged, as it is imperative that you return to your primary care physician (or establish a relationship with a primary care physician if you do not have one) for your aftercare needs so that they can reassess your need for medications and monitor your lab values.    Allergies as of 10/13/2020      Reactions   Sulfa Antibiotics Shortness Of Breath, Swelling   All-over swelling   Penicillins Rash      Medication List    STOP taking these medications   albuterol 108 (90 Base) MCG/ACT inhaler Commonly known as: VENTOLIN HFA     TAKE these medications   aspirin 81 MG EC tablet Take 1 tablet (81 mg total) by mouth daily. Swallow whole.   atorvastatin 20 MG tablet Commonly known as: LIPITOR Take 1 tablet (20 mg total) by mouth daily.   clopidogrel 75 MG tablet Commonly known as: PLAVIX Take 1 tablet (75 mg total) by mouth daily with breakfast.   glipiZIDE 10 MG 24 hr tablet Commonly known as: GLUCOTROL XL Take 1 tablet (10 mg total) by mouth daily with breakfast.   insulin NPH Human  100 UNIT/ML injection Commonly known as: NOVOLIN N Inject 20 Units into the skin 2 (two) times daily before a meal.   lisinopril 5 MG tablet Commonly known as: ZESTRIL Take 1 tablet (5 mg total) by mouth daily.   pantoprazole 40 MG tablet Commonly known as: PROTONIX Take 1 tablet (40 mg total) by mouth daily.   pioglitazone 45 MG tablet Commonly known as: ACTOS Take 1 tablet (45 mg total) by mouth daily.   rivaroxaban 20 MG Tabs tablet Commonly known as: XARELTO Take 1 tablet (20 mg total) by mouth daily with supper.   TYLENOL SINUS SEVERE PO Take 1 capsule by mouth once.            Durable Medical Equipment  (From admission, onward)  Start     Ordered   10/13/20 1229  For home use only DME Walker rolling  Once       Question Answer Comment  Walker: With 5 Inch Wheels   Patient needs a walker to treat with the following condition Stroke [facility])      10/13/20 1228          Time coordinating discharge: 35 minutes  The results of significant diagnostics from this hospitalization (including imaging, microbiology, ancillary and laboratory) are listed below for reference.    Procedures and Diagnostic Studies:   CT Angio Head W or Wo Contrast  Result Date: 10/11/2020 CLINICAL DATA:  Initial evaluation for subacute stroke. EXAM: CT ANGIOGRAPHY HEAD AND NECK TECHNIQUE: Multidetector CT imaging of the head and neck was performed using the standard protocol during bolus administration of intravenous contrast. Multiplanar CT image reconstructions and MIPs were obtained to evaluate the vascular anatomy. Carotid stenosis measurements (when applicable) are obtained utilizing NASCET criteria, using the distal internal carotid diameter as the denominator. CONTRAST:  OMNIPAQUE IOHEXOL 350 MG/ML SOLN COMPARISON:  Prior CT from earlier the same day. FINDINGS: CTA NECK FINDINGS Aortic arch: Visualized aortic arch of normal caliber with normal branch pattern. Mild  atheromatous change about the origin of the great vessels without hemodynamically significant stenosis. Right carotid system: Right CCA patent from its origin to the bifurcation without stenosis. Multifocal calcified plaque about the right bifurcation/proximal right ICA with associated stenosis of up to 60% by NASCET criteria. Right ICA otherwise patent to the skull base without stenosis, dissection or occlusion. Left carotid system: Left CCA patent from its origin to the bifurcation without stenosis. Bulky calcified plaque about the left bifurcation/proximal left ICA with associated stenosis of up to 60% by NASCET criteria. Left ICA partially medialized into the retropharyngeal space but is otherwise widely patent to the skull base without stenosis, dissection or occlusion. Vertebral arteries: Both vertebral arteries arise from the subclavian arteries. No proximal subclavian artery stenosis. Right vertebral artery slightly dominant. Single focus of calcified plaque noted at the pre foraminal right V1 segment without significant stenosis. Vertebral arteries otherwise patent without stenosis, dissection or occlusion. Skeleton: No acute osseous abnormality. No discrete or worrisome osseous lesions. Poor dentition noted. Other neck: No other acute soft tissue abnormality within the neck. No adenopathy. 1 cm left thyroid nodule noted (series 4, image 56), felt to be of doubtful significance given size and patient age. No follow-up imaging recommended regarding this lesion. No other mass lesion. Upper chest: Better evaluated on concomitant CT of the chest. Review of the MIP images confirms the above findings CTA HEAD FINDINGS Anterior circulation: Petrous segments patent bilaterally. Scattered atheromatous change within the cavernous/supraclinoid ICAs with associated mild to moderate multifocal narrowing, left worse than right. A1 segments patent bilaterally. Left A1 hypoplastic, accounting for the slightly diminutive  left ICA is compared to the right. Normal anterior communicating artery complex. Anterior cerebral arteries patent to their distal aspects without stenosis. M1 segments patent bilaterally. On the right, there is occlusion of a proximal right M2 branch, in keeping with the right MCA territory infarct (series 9, image 59). Left MCA branches well perfused. Posterior circulation: Vertebral arteries patent to the vertebrobasilar junction without stenosis. Both picas patent. Basilar patent to its distal aspect without stenosis. Superior cerebral arteries patent bilaterally. Both PCAs well perfused to their distal aspects without appreciable stenosis. Small right posterior communicating artery noted. Venous sinuses: Grossly patent allowing for timing the contrast bolus. Anatomic variants:  None significant.  No aneurysm. Review of the MIP images confirms the above findings IMPRESSION: 1. Occlusion of a proximal right M2 branch, in keeping with the right MCA territory infarct. 2. Atheromatous change about the carotid bifurcations with associated stenoses of up to 60% bilaterally. 3. Atherosclerotic change within the carotid siphons with associated mild to moderate multifocal narrowing, left worse than right. Electronically Signed   By: Rise Mu M.D.   On: 10/11/2020 05:35   CT Head Wo Contrast  Result Date: 10/11/2020 CLINICAL DATA:  Initial evaluation for acute headache, balance difficulty. EXAM: CT HEAD WITHOUT CONTRAST TECHNIQUE: Contiguous axial images were obtained from the base of the skull through the vertex without intravenous contrast. COMPARISON:  None available. FINDINGS: Brain: Cerebral volume within normal limits for age. Patchy hypodensity seen within the periventricular and deep white matter both cerebral hemispheres most consistent with chronic small vessel ischemic disease. Multiple scattered remote infarcts noted about the cerebellum bilaterally. There is a well demarcated somewhat  wedge-shaped parenchymal hypodensity involving the right temporal occipital region, consistent with a subacute right MCA territory infarct. No associated hemorrhage or significant regional mass effect. No other definite acute large vessel territory infarct. Vague hypodensity involving the left greater than right occipital poles favored to be artifactual on this exam. No acute intracranial hemorrhage. No mass lesion or midline shift. No hydrocephalus or extra-axial fluid collection. Vascular: Suspected asymmetric hyperdensity involving right MCA branch and/or branches, likely occluded given the subacute right MCA distribution infarct. No other asymmetric hyperdense vessel. Skull: Scalp soft tissues within normal limits.  Calvarium intact. Sinuses/Orbits: Globes and orbital soft tissues within normal limits. Mild scattered mucosal thickening noted within the ethmoidal air cells and maxillary sinuses. Paranasal sinuses are otherwise clear. No mastoid effusion. Other: None. IMPRESSION: 1. Well-demarcated parenchymal hypodensity involving the posterior right temporal occipital region, consistent with subacute right MCA territory infarct. No associated hemorrhage or significant regional mass effect. 2. Underlying age-related cerebral atrophy with chronic small vessel ischemic disease, with multiple remote bilateral cerebellar infarcts. Results discussed by telephone at the time of interpretation on 10/11/2020 at 4:12 am with Dr. Cy Blamer. Electronically Signed   By: Rise Mu M.D.   On: 10/11/2020 04:22   CT Angio Neck W and/or Wo Contrast  Result Date: 10/11/2020 CLINICAL DATA:  Initial evaluation for subacute stroke. EXAM: CT ANGIOGRAPHY HEAD AND NECK TECHNIQUE: Multidetector CT imaging of the head and neck was performed using the standard protocol during bolus administration of intravenous contrast. Multiplanar CT image reconstructions and MIPs were obtained to evaluate the vascular anatomy.  Carotid stenosis measurements (when applicable) are obtained utilizing NASCET criteria, using the distal internal carotid diameter as the denominator. CONTRAST:  OMNIPAQUE IOHEXOL 350 MG/ML SOLN COMPARISON:  Prior CT from earlier the same day. FINDINGS: CTA NECK FINDINGS Aortic arch: Visualized aortic arch of normal caliber with normal branch pattern. Mild atheromatous change about the origin of the great vessels without hemodynamically significant stenosis. Right carotid system: Right CCA patent from its origin to the bifurcation without stenosis. Multifocal calcified plaque about the right bifurcation/proximal right ICA with associated stenosis of up to 60% by NASCET criteria. Right ICA otherwise patent to the skull base without stenosis, dissection or occlusion. Left carotid system: Left CCA patent from its origin to the bifurcation without stenosis. Bulky calcified plaque about the left bifurcation/proximal left ICA with associated stenosis of up to 60% by NASCET criteria. Left ICA partially medialized into the retropharyngeal space but is otherwise widely patent to  the skull base without stenosis, dissection or occlusion. Vertebral arteries: Both vertebral arteries arise from the subclavian arteries. No proximal subclavian artery stenosis. Right vertebral artery slightly dominant. Single focus of calcified plaque noted at the pre foraminal right V1 segment without significant stenosis. Vertebral arteries otherwise patent without stenosis, dissection or occlusion. Skeleton: No acute osseous abnormality. No discrete or worrisome osseous lesions. Poor dentition noted. Other neck: No other acute soft tissue abnormality within the neck. No adenopathy. 1 cm left thyroid nodule noted (series 4, image 56), felt to be of doubtful significance given size and patient age. No follow-up imaging recommended regarding this lesion. No other mass lesion. Upper chest: Better evaluated on concomitant CT of the chest. Review  of the MIP images confirms the above findings CTA HEAD FINDINGS Anterior circulation: Petrous segments patent bilaterally. Scattered atheromatous change within the cavernous/supraclinoid ICAs with associated mild to moderate multifocal narrowing, left worse than right. A1 segments patent bilaterally. Left A1 hypoplastic, accounting for the slightly diminutive left ICA is compared to the right. Normal anterior communicating artery complex. Anterior cerebral arteries patent to their distal aspects without stenosis. M1 segments patent bilaterally. On the right, there is occlusion of a proximal right M2 branch, in keeping with the right MCA territory infarct (series 9, image 59). Left MCA branches well perfused. Posterior circulation: Vertebral arteries patent to the vertebrobasilar junction without stenosis. Both picas patent. Basilar patent to its distal aspect without stenosis. Superior cerebral arteries patent bilaterally. Both PCAs well perfused to their distal aspects without appreciable stenosis. Small right posterior communicating artery noted. Venous sinuses: Grossly patent allowing for timing the contrast bolus. Anatomic variants: None significant.  No aneurysm. Review of the MIP images confirms the above findings IMPRESSION: 1. Occlusion of a proximal right M2 branch, in keeping with the right MCA territory infarct. 2. Atheromatous change about the carotid bifurcations with associated stenoses of up to 60% bilaterally. 3. Atherosclerotic change within the carotid siphons with associated mild to moderate multifocal narrowing, left worse than right. Electronically Signed   By: Rise Mu M.D.   On: 10/11/2020 05:35   CT Angio Chest PE W and/or Wo Contrast  Result Date: 10/11/2020 CLINICAL DATA:  59 year old female with chest pain. EXAM: CT ANGIOGRAPHY CHEST WITH CONTRAST TECHNIQUE: Multidetector CT imaging of the chest was performed using the standard protocol during bolus administration of  intravenous contrast. Multiplanar CT image reconstructions and MIPs were obtained to evaluate the vascular anatomy. CONTRAST:  OMNIPAQUE IOHEXOL 350 MG/ML SOLN COMPARISON:  06/27/2020 CT FINDINGS: Cardiovascular: This is a technically satisfactory study. No pulmonary emboli are identified. There is no evidence of thoracic aortic aneurysm/definite dissection. UPPER limits normal heart size is noted. Moderate coronary artery atherosclerotic calcifications are in identified, greatest in the LAD. No pericardial effusion. Mediastinum/Nodes: No enlarged mediastinal, hilar, or axillary lymph nodes. Thyroid gland, trachea, and esophagus demonstrate no significant findings. Lungs/Pleura: Mild ground-glass opacities within both lungs is relatively unchanged. Scarring/atelectasis within the lingula again identified. No new abnormalities are present. No consolidation, pleural effusion or pneumothorax noted. Upper Abdomen: No acute abnormality. Musculoskeletal: No acute abnormality. Review of the MIP images confirms the above findings. IMPRESSION: 1. No evidence of acute abnormality. No evidence of pulmonary emboli or thoracic aortic aneurysm/definite dissection. 2. Unchanged nonspecific mild ground-glass opacities within both lungs. Previously identified consolidation/airspace opacities have resolved. 3. Coronary artery disease. Electronically Signed   By: Harmon Pier M.D.   On: 10/11/2020 05:51   MR BRAIN WO CONTRAST  Result Date:  10/12/2020 CLINICAL DATA:  Follow-up examination for acute stroke. EXAM: MRI HEAD WITHOUT CONTRAST TECHNIQUE: Multiplanar, multiecho pulse sequences of the brain and surrounding structures were obtained without intravenous contrast. COMPARISON:  Prior CT and CTA from 10/11/2020. FINDINGS: Brain: Examination mildly degraded by motion artifact. Cerebral volume within normal limits for age. Patchy and confluent T2/FLAIR hyperintensity throughout the periventricular deep white matter both  cerebral hemispheres most consistent with chronic small vessel ischemic disease, moderate in nature. Multiple scattered remote infarcts noted about the cerebellum bilaterally. Moderate to large size confluent area of restricted diffusion involving the right parietotemporal region consistent with an early subacute right MCA territory infarct. Extension to involve the right periatrial white matter. Mild localized edema with mass effect on the adjacent atrium of the right lateral ventricle. No other significant regional mass effect. Susceptibility artifact at the level of the right sylvian fissure suspected be related to previously identified M2 thrombus. No other evidence for associated hemorrhage or hemorrhagic transformation. Additional 1.5 cm acute to early subacute ischemic infarct seen involving the left periatrial white matter (series 5, image 75). No associated hemorrhage mass effect. Additional 1.4 cm patchy diffusion abnormality involving the peripheral right cerebellum also consistent with acute to early subacute ischemia (series 5, image 58). No associated hemorrhage or mass effect. Few additional punctate foci of acute to subacute ischemia noted involving the right splenium (series 5, image 78 and left frontal centrum semi ovale (series 7, image 51). No associated hemorrhage. Gray-white matter differentiation otherwise maintained. No other foci of susceptibility artifact to suggest acute or chronic intracranial hemorrhage. No mass lesion or midline shift. No hydrocephalus or extra-axial fluid collection. Pituitary gland suprasellar region normal. Midline structures intact. Vascular: Major intracranial vascular flow voids maintained at the skull base. Previously identified right M2 occlusion better seen on prior CTA. Skull and upper cervical spine: Craniocervical junction within normal limits. Bone marrow signal intensity normal. No scalp soft tissue abnormality. Sinuses/Orbits: Globes and orbital soft  tissues within normal limits. Mild right ethmoidal sinusitis. No significant mastoid effusion. Inner ear structures grossly normal. Other: None. IMPRESSION: 1. Moderate to large size early subacute right MCA territory infarct involving the right parietotemporal region. Mild localized edema without significant regional mass effect. 2. Additional 1.5 cm acute to early subacute ischemic nonhemorrhagic infarcts involving the left periatrial white matter and right cerebellum. 3. Few additional punctate foci of acute to early subacute ischemia involving the right splenium and left frontal centrum semi ovale as above. 4. Underlying moderate chronic microvascular ischemic disease with multiple scattered remote infarcts about the cerebellum bilaterally. This Electronically Signed   By: Rise Mu M.D.   On: 10/12/2020 22:05   DG Chest Portable 1 View  Result Date: 10/11/2020 CLINICAL DATA:  CVA. EXAM: PORTABLE CHEST 1 VIEW COMPARISON:  06/27/2020 FINDINGS: UPPER limits normal heart size again noted. Patchy opacity within the LEFT LOWER lung does not appear significantly changed. RIGHT lung is clear. No large pleural effusion or pneumothorax. No acute bony abnormalities are identified. IMPRESSION: Unchanged LEFT LOWER lung patchy opacity which may represent atelectasis or airspace disease. Electronically Signed   By: Harmon Pier M.D.   On: 10/11/2020 05:43   ECHOCARDIOGRAM COMPLETE  Result Date: 10/12/2020    ECHOCARDIOGRAM REPORT   Patient Name:   ARRIYANA RODELL Date of Exam: 10/12/2020 Medical Rec #:  [id number]        Height:       66.0 in Accession #:    [id number]  Weight:       218.7 lb Date of Birth:  1960-12-08        BSA:          2.077 m Patient Age:    59 years         BP:           140/72 mmHg Patient Gender: F                HR:           74 bpm. Exam Location:  Inpatient Procedure: 2D Echo Indications:    TIA 435.9  History:        Patient has prior history of Echocardiogram  examinations, most                 recent 06/28/2020. CHF, COPD; Risk Factors:Diabetes and Current                 Smoker.  Sonographer:    Delcie Roch Referring Phys: [id number] PING T ZHANG IMPRESSIONS  1. Left ventricular ejection fraction, by estimation, is 60 to 65%. The left ventricle has normal function. The left ventricle has no regional wall motion abnormalities. Left ventricular diastolic parameters are consistent with Grade II diastolic dysfunction (pseudonormalization). Elevated left ventricular end-diastolic pressure.  2. Right ventricular systolic function is normal. The right ventricular size is normal.  3. The mitral valve is abnormal. Trivial mitral valve regurgitation. No evidence of mitral stenosis.  4. The aortic valve is normal in structure. Aortic valve regurgitation is not visualized. Mild to moderate aortic valve sclerosis/calcification is present, without any evidence of aortic stenosis.  5. The inferior vena cava is normal in size with greater than 50% respiratory variability, suggesting right atrial pressure of 3 mmHg. FINDINGS  Left Ventricle: Left ventricular ejection fraction, by estimation, is 60 to 65%. The left ventricle has normal function. The left ventricle has no regional wall motion abnormalities. The left ventricular internal cavity size was normal in size. There is  no left ventricular hypertrophy. Left ventricular diastolic parameters are consistent with Grade II diastolic dysfunction (pseudonormalization). Elevated left ventricular end-diastolic pressure. Right Ventricle: The right ventricular size is normal. No increase in right ventricular wall thickness. Right ventricular systolic function is normal. Left Atrium: Left atrial size was normal in size. Right Atrium: Right atrial size was normal in size. Pericardium: There is no evidence of pericardial effusion. Mitral Valve: The mitral valve is abnormal. There is moderate thickening of the mitral valve leaflet(s). There is  moderate calcification of the mitral valve leaflet(s). Mild mitral annular calcification. Trivial mitral valve regurgitation. No evidence of  mitral valve stenosis. Tricuspid Valve: The tricuspid valve is normal in structure. Tricuspid valve regurgitation is trivial. No evidence of tricuspid stenosis. Aortic Valve: The aortic valve is normal in structure. Aortic valve regurgitation is not visualized. Mild to moderate aortic valve sclerosis/calcification is present, without any evidence of aortic stenosis. Pulmonic Valve: The pulmonic valve was normal in structure. Pulmonic valve regurgitation is not visualized. No evidence of pulmonic stenosis. Aorta: The aortic root is normal in size and structure. Venous: The inferior vena cava is normal in size with greater than 50% respiratory variability, suggesting right atrial pressure of 3 mmHg. IAS/Shunts: The interatrial septum was not well visualized.  LEFT VENTRICLE PLAX 2D LVIDd:         4.70 cm  Diastology LVIDs:         2.90 cm  LV e' medial:    6.85  cm/s LV PW:         1.10 cm  LV E/e' medial:  21.5 LV IVS:        1.00 cm  LV e' lateral:   9.68 cm/s LVOT diam:     2.20 cm  LV E/e' lateral: 15.2 LV SV:         80 LV SV Index:   38 LVOT Area:     3.80 cm  RIGHT VENTRICLE RV S prime:     14.60 cm/s TAPSE (M-mode): 2.2 cm LEFT ATRIUM             Index       RIGHT ATRIUM           Index LA diam:        3.20 cm 1.54 cm/m  RA Area:     12.20 cm LA Vol (A2C):   52.8 ml 25.42 ml/m RA Volume:   26.10 ml  12.56 ml/m LA Vol (A4C):   52.1 ml 25.08 ml/m LA Biplane Vol: 55.4 ml 26.67 ml/m  AORTIC VALVE LVOT Vmax:   113.00 cm/s LVOT Vmean:  75.700 cm/s LVOT VTI:    0.210 m  AORTA Ao Root diam: 2.80 cm Ao Asc diam:  3.00 cm MITRAL VALVE MV Area (PHT): 2.37 cm     SHUNTS MV Decel Time: 320 msec     Systemic VTI:  0.21 m MV E velocity: 147.00 cm/s  Systemic Diam: 2.20 cm MV A velocity: 149.00 cm/s MV E/A ratio:  0.99 Charlton Haws MD Electronically signed by Charlton Haws MD  Signature Date/Time: 10/12/2020/10:30:25 AM    Final    VAS US CAROTID  Result Date: 10/12/2020 Carotid Arterial Duplex Study Indications:       CVA and stenosis shown on ct. Risk Factors:      Diabetes. Comparison Study:  no prior Performing Technologist: Blanch Media RVS  Examination Guidelines: A complete evaluation includes B-mode imaging, spectral Doppler, color Doppler, and power Doppler as needed of all accessible portions of each vessel. Bilateral testing is considered an integral part of a complete examination. Limited examinations for reoccurring indications may be performed as noted.  Right Carotid Findings: +----------+--------+--------+--------+------------------+--------+           PSV cm/sEDV cm/sStenosisPlaque DescriptionComments +----------+--------+--------+--------+------------------+--------+ CCA Prox  103     19              heterogenous               +----------+--------+--------+--------+------------------+--------+ CCA Distal65      17              heterogenous               +----------+--------+--------+--------+------------------+--------+ ICA Prox  70      25      1-39%   heterogenous               +----------+--------+--------+--------+------------------+--------+ ICA Distal80      29                                         +----------+--------+--------+--------+------------------+--------+ ECA       172     23                                         +----------+--------+--------+--------+------------------+--------+ +----------+--------+-------+--------+-------------------+  PSV cm/sEDV cmsDescribeArm Pressure (mmHG) +----------+--------+-------+--------+-------------------+ [id number]                                         +----------+--------+-------+--------+-------------------+ +---------+--------+--+--------+--+---------+ VertebralPSV cm/s49EDV cm/s16Antegrade +---------+--------+--+--------+--+---------+   Left Carotid Findings: +----------+--------+--------+--------+------------------+--------+           PSV cm/sEDV cm/sStenosisPlaque DescriptionComments +----------+--------+--------+--------+------------------+--------+ CCA Prox  88      21              heterogenous               +----------+--------+--------+--------+------------------+--------+ CCA Distal53      13              heterogenous               +----------+--------+--------+--------+------------------+--------+ ICA Prox  103     33      1-39%   heterogenous               +----------+--------+--------+--------+------------------+--------+ ICA Distal90      32                                         +----------+--------+--------+--------+------------------+--------+ ECA       120     13                                         +----------+--------+--------+--------+------------------+--------+ +----------+--------+--------+--------+-------------------+           PSV cm/sEDV cm/sDescribeArm Pressure (mmHG) +----------+--------+--------+--------+-------------------+ [id number]                                          +----------+--------+--------+--------+-------------------+ +---------+--------+--+--------+--+---------+ VertebralPSV cm/s44EDV cm/s13Antegrade +---------+--------+--+--------+--+---------+   Summary: Right Carotid: Velocities in the right ICA are consistent with a 1-39% stenosis. Left Carotid: Velocities in the left ICA are consistent with a 1-39% stenosis. Vertebrals: Bilateral vertebral arteries demonstrate antegrade flow. *See table(s) above for measurements and observations.  Electronically signed by Waverly Ferrari MD on 10/12/2020 at 6:23:08 PM.    Final      Labs:   Basic Metabolic Panel: Recent Labs  Lab 10/11/20 0358 10/11/20 0358 10/11/20 0416 10/13/20 0151  NA 137  --  140 139  K 3.5   < > 3.4* 3.9  CL 102  --   --  103  CO2 25  --   --  23  GLUCOSE  184*  --   --  148*  BUN 16  --   --  15  CREATININE 0.98  --   --  1.05*  CALCIUM 8.9  --   --  9.0  MG 1.9  --   --   --    < > = values in this interval not displayed.   GFR Estimated Creatinine Clearance (by C-G formula based on SCr of 1.05 mg/dL (H)) Female: 02.5 mL/min (A) Female: 84.6 mL/min (A) Liver Function Tests: Recent Labs  Lab 10/11/20 0358  AST 16  ALT 21  ALKPHOS 103  BILITOT 0.2*  PROT 7.4  ALBUMIN 3.6   No results for input(s): LIPASE, AMYLASE in the last 168 hours. No results  for input(s): AMMONIA in the last 168 hours. Coagulation profile Recent Labs  Lab 10/11/20 0358  INR 1.0    CBC: Recent Labs  Lab 10/11/20 0358 10/11/20 0416 10/13/20 0151  WBC 9.3  --  8.6  NEUTROABS 5.3  --  4.5  HGB 16.1* 16.7* 15.5*  HCT 49.2* 49.0* 47.4*  MCV 91.8  --  92.4  PLT 278  --  259   Cardiac Enzymes: No results for input(s): CKTOTAL, CKMB, CKMBINDEX, TROPONINI in the last 168 hours. BNP: Invalid input(s): POCBNP CBG: Recent Labs  Lab 10/12/20 1657 10/12/20 2149 10/13/20 0612 10/13/20 0922 10/13/20 1338  GLUCAP 223* 227* 159* 145* 215*   D-Dimer No results for input(s): DDIMER in the last 72 hours. Hgb A1c Recent Labs    10/12/20 0157  HGBA1C 9.1*   Lipid Profile Recent Labs    10/12/20 0157  CHOL 195  HDL 31*  LDLCALC 125*  TRIG 197*  CHOLHDL 6.3   Thyroid function studies No results for input(s): TSH, T4TOTAL, T3FREE, THYROIDAB in the last 72 hours.  Invalid input(s): FREET3 Anemia work up No results for input(s): VITAMINB12, FOLATE, FERRITIN, TIBC, IRON, RETICCTPCT in the last 72 hours. Microbiology Recent Results (from the past 240 hour(s))  Resp Panel by RT-PCR (Flu A&B, Covid) Nasopharyngeal Swab     Status: None   Collection Time: 10/11/20  3:58 AM   Specimen: Nasopharyngeal Swab; Nasopharyngeal(NP) swabs in vial transport medium  Result Value Ref Range Status   SARS Coronavirus 2 by RT PCR NEGATIVE NEGATIVE Final     Comment: (NOTE) SARS-CoV-2 target nucleic acids are NOT DETECTED.  The SARS-CoV-2 RNA is generally detectable in upper respiratory specimens during the acute phase of infection. The lowest concentration of SARS-CoV-2 viral copies this assay can detect is 138 copies/mL. A negative result does not preclude SARS-Cov-2 infection and should not be used as the sole basis for treatment or other patient management decisions. A negative result may occur with  improper specimen collection/handling, submission of specimen other than nasopharyngeal swab, presence of viral mutation(s) within the areas targeted by this assay, and inadequate number of viral copies(<138 copies/mL). A negative result must be combined with clinical observations, patient history, and epidemiological information. The expected result is Negative.  Fact Sheet for Patients:  BloggerCourse.com  Fact Sheet for Healthcare Providers:  SeriousBroker.it  This test is no t yet approved or cleared by the [place] FDA and  has been authorized for detection and/or diagnosis of SARS-CoV-2 by FDA under an Emergency Use Authorization (EUA). This EUA will remain  in effect (meaning this test can be used) for the duration of the COVID-19 declaration under Section 564(b)(1) of the Act, 21 U.S.C.section 360bbb-3(b)(1), unless the authorization is terminated  or revoked sooner.       Influenza A by PCR NEGATIVE NEGATIVE Final   Influenza B by PCR NEGATIVE NEGATIVE Final    Comment: (NOTE) The Xpert Xpress SARS-CoV-2/FLU/RSV plus assay is intended as an aid in the diagnosis of influenza from Nasopharyngeal swab specimens and should not be used as a sole basis for treatment. Nasal washings and aspirates are unacceptable for Xpert Xpress SARS-CoV-2/FLU/RSV testing.  Fact Sheet for Patients: BloggerCourse.com  Fact Sheet for Healthcare  Providers: SeriousBroker.it  This test is not yet approved or cleared by the [place] FDA and has been authorized for detection and/or diagnosis of SARS-CoV-2 by FDA under an Emergency Use Authorization (EUA). This EUA will remain in effect (meaning this test can be  used) for the duration of the COVID-19 declaration under Section 564(b)(1) of the Act, 21 U.S.C. section 360bbb-3(b)(1), unless the authorization is terminated or revoked.  Performed at [facility], [address] [place], [place] [address]      Signed: Lorin Glass  [facility] 10/13/2020, 2:43 PM

## 2020-10-13 NOTE — Progress Notes (Signed)
Pt and her significant other received discharge instructions and do not have any additional questions or concerns at this point. Pt encouraged to schedule her follow up appointments with her primary physician and neurologist. Vss are stable, pt is ready for discharge.

## 2020-12-17 ENCOUNTER — Other Ambulatory Visit: Payer: Self-pay

## 2020-12-17 ENCOUNTER — Ambulatory Visit: Payer: Self-pay | Admitting: Neurology

## 2020-12-17 ENCOUNTER — Encounter: Payer: Self-pay | Admitting: Neurology

## 2020-12-17 VITALS — BP 141/81 | HR 86 | Ht 66.0 in | Wt 204.7 lb

## 2020-12-17 DIAGNOSIS — I639 Cerebral infarction, unspecified: Secondary | ICD-10-CM

## 2020-12-17 DIAGNOSIS — U071 COVID-19: Secondary | ICD-10-CM

## 2020-12-17 NOTE — Progress Notes (Signed)
[facility] [address] [place]. [place] [address] [phone]       OFFICE FOLLOW-UP NOTE  [name] Date of Birth:  1961-01-10 Medical Record Number:  [id number]   HPI: Jill Barnes is 60 year old Caucasian lady seen today for initial office follow-up visit following hospital admission for stroke in November 2021.  She is accompanied by her daughter Baird Lyons and history is obtained from them, review of electronic medical records and I personally reviewed pertinent imaging films in PACS.  She has past medical history of diabetes, pretension, hyperlipidemia, COPD, obesity, who was initially admitted in July 2021 due to Covid infection and developed bilateral pulmonary embolism and was discharged on Xarelto but patient apparently stopped it without completing full 3 months.  She presented to [facility] on 10/11/2020 with new onset headache and balance difficulties.  This began 2 days prior to presentation.  CT scan of the head showed well-demarcated hypodensity involving the right posterior temporal occipital region as well as subacute right MCA territory stroke without any hemorrhage or mass-effect.  CT angiogram of the head and neck showed occluded right M2 branch and 60% stenosis of bilateral carotid bifurcations and atherosclerotic changes at both carotid siphons.  Transthoracic echo showed normal ejection fraction without cardiac source of embolism.  LDL cholesterol was elevated at 125 mg percent and hemoglobin A1c at 9.1.  Lower extremity venous Dopplers were negative for DVT.  CT scanning of the chest showed no evidence of pulmonary emboli.  Patient was started on aspirin Plavix for 3 weeks which she took and had stopped Plavix and is currently on aspirin alone.  She continues to have left-sided peripheral vision loss.  She also complains of getting tired more easily and her stamina is not right.  She still continues to smoke but claims that she is cut back to  half pack per day.  Her sugars have been persistently elevated she plans to see her medical doctor and get a referral to endocrinologist.  She has no new complaints.  ROS:   14 system review of systems is positive for vision loss, eye tearing, eye irritation, tiredness, decreased stamina all other systems negative  PMH:  Past Medical History:  Diagnosis Date  . COPD (chronic obstructive pulmonary disease) (HCC)   . Diabetes mellitus without complication (HCC)   . Kidney stone     Social History:  Social History   Socioeconomic History  . Marital status: Married    Spouse name: Not on file  . Number of children: Not on file  . Years of education: Not on file  . Highest education level: Not on file  Occupational History  . Occupation: unemployed  Tobacco Use  . Smoking status: Current Every Day Smoker    Packs/day: 1.00  . Smokeless tobacco: Never Used  Vaping Use  . Vaping Use: Never used  Substance and Sexual Activity  . Alcohol use: No  . Drug use: No  . Sexual activity: Not on file  Other Topics Concern  . Not on file  Social History Narrative   Lives with husband   Right handed   Drinks 3-5 cups of caffeine daily   Social Determinants of Health   Financial Resource Strain: Not on file  Food Insecurity: Not on file  Transportation Needs: Not on file  Physical Activity: Not on file  Stress: Not on file  Social Connections: Not on file  Intimate Partner Violence: Not on file    Medications:  Current Outpatient Medications on File Prior to Visit  Medication Sig Dispense Refill  . aspirin EC 81 MG EC tablet Take 1 tablet (81 mg total) by mouth daily. Swallow whole. 90 tablet 0  . atorvastatin (LIPITOR) 20 MG tablet Take 1 tablet (20 mg total) by mouth daily. 30 tablet 0  . clopidogrel (PLAVIX) 75 MG tablet Take 1 tablet (75 mg total) by mouth daily with breakfast. 30 tablet 2  . glipiZIDE (GLUCOTROL XL) 10 MG 24 hr tablet Take 1 tablet (10 mg total) by mouth  daily with breakfast. 30 tablet 0  . insulin NPH Human (NOVOLIN N) 100 UNIT/ML injection Inject 20 Units into the skin 2 (two) times daily before a meal.    . lisinopril (ZESTRIL) 5 MG tablet Take 1 tablet (5 mg total) by mouth daily. 90 tablet 0  . pantoprazole (PROTONIX) 40 MG tablet Take 1 tablet (40 mg total) by mouth daily. 30 tablet 0  . pioglitazone (ACTOS) 45 MG tablet Take 1 tablet (45 mg total) by mouth daily. 30 tablet 0  . Phenylephrine-APAP-guaiFENesin (TYLENOL SINUS SEVERE PO) Take 1 capsule by mouth once.    . rivaroxaban (XARELTO) 20 MG TABS tablet Take 1 tablet (20 mg total) by mouth daily with supper. 30 tablet 0   No current facility-administered medications on file prior to visit.    Allergies:   Allergies  Allergen Reactions  . Sulfa Antibiotics Shortness Of Breath and Swelling    All-over swelling  . Penicillins Rash    Physical Exam General: Obese middle-aged Caucasian lady, seated, in no evident distress Head: head normocephalic and atraumatic.  Neck: supple with no carotid or supraclavicular bruits Cardiovascular: regular rate and rhythm, no murmurs Musculoskeletal: no deformity Skin:  no rash/petichiae Vascular:  Normal pulses all extremities Vitals:   12/17/20 1512  BP: (!) 141/81  Pulse: 86   Neurologic Exam Mental Status: Awake and fully alert. Oriented to place and time. Recent and remote memory intact. Attention span, concentration and fund of knowledge appropriate. Mood and affect appropriate.  Cranial Nerves: Fundoscopic exam reveals sharp disc margins. Pupils equal, briskly reactive to light. Extraocular movements full without nystagmus. Visual fields full show partial left homonymous hemianopsia to confrontation. Hearing intact. Facial sensation intact.  Mild left lower facial weakness tongue, palate moves normally and symmetrically.  Motor: Normal bulk and tone. Normal strength in all tested extremity muscles.  Diminished fine finger movements on  the left.  Orbits right over left upper extremity. Sensory.: intact to touch ,pinprick .position and vibratory sensation.  Coordination: Rapid alternating movements normal in all extremities. Finger-to-nose and heel-to-shin performed accurately bilaterally. Gait and Station: Arises from chair without difficulty. Stance is normal. Gait demonstrates normal stride length and balance . Able to heel, toe and tandem walk with some difficulty.  Reflexes: 1+ and symmetric. Toes downgoing.   NIHSS  2 Modified Rankin 2   ASSESSMENT: 60 year old Caucasian lady with cryptogenic right MCA and left MCA and right cerebellar infarcts in November 2021 following Covid infection 4 months ago.  Vascular risk factors of smoking, hypertension, diabetes, hyperlipidemia and obesity     PLAN: I had a long d/w patient and her daughter about her recent cryptogenic embolic strokes, risk for recurrent stroke/TIAs, personally independently reviewed imaging studies and stroke evaluation results and answered questions.Continue enteric-coated 81 mg aspirin daily for secondary stroke prevention and maintain strict control of hypertension with blood pressure goal below 130/90, diabetes with hemoglobin A1c goal below 6.5% and lipids with LDL  cholesterol goal below 70 mg/dL. I also advised the patient to eat a healthy diet with plenty of whole grains, cereals, fruits and vegetables, exercise regularly and maintain ideal body weight .check transcranial Doppler bubble study for PFO and 30-day heart monitor for paroxysmal A. fib.  Patient was advised not to drive due to a persistent peripheral vision loss till it improves.  She was also counseled to quit smoking completely and she expressed understanding.  Followup in the future with my nurse practitioner Shanda Bumps in 3 months or call earlier if necessary. Greater than 50% of time during this 25 minute visit was spent on counseling,explanation of diagnosis of cryptogenic stroke, planning of  further management, discussion with patient and family and coordination of care Delia Heady, MD  [facility] [address] [place], [place] [address]  Phone [phone] Fax [phone] Note: This document was prepared with digital dictation and possible smart phrase technology. Any transcriptional errors that result from this process are unintentional

## 2020-12-17 NOTE — Patient Instructions (Signed)
I had a long d/w patient and her daughter about her recent cryptogenic embolic strokes, risk for recurrent stroke/TIAs, personally independently reviewed imaging studies and stroke evaluation results and answered questions.Continue enteric-coated 81 mg aspirin daily for secondary stroke prevention and maintain strict control of hypertension with blood pressure goal below 130/90, diabetes with hemoglobin A1c goal below 6.5% and lipids with LDL cholesterol goal below 70 mg/dL. I also advised the patient to eat a healthy diet with plenty of whole grains, cereals, fruits and vegetables, exercise regularly and maintain ideal body weight .check transcranial Doppler bubble study for PFO and 30-day heart monitor for paroxysmal A. fib.  Patient was advised not to drive due to a persistent peripheral vision loss till it improves.  She was also counseled to quit smoking completely and she expressed understanding.  Followup in the future with my nurse practitioner Shanda Bumps in 3 months or call earlier if necessary.  Stroke Prevention Some medical conditions and behaviors are associated with a higher chance of having a stroke. You can help prevent a stroke by making nutrition, lifestyle, and other changes, including managing any medical conditions you may have. What nutrition changes can be made?  Eat healthy foods. You can do this by: ? Choosing foods high in fiber, such as fresh fruits and vegetables and whole grains. ? Eating at least 5 or more servings of fruits and vegetables a day. Try to fill half of your plate at each meal with fruits and vegetables. ? Choosing lean protein foods, such as lean cuts of meat, poultry without skin, fish, tofu, beans, and nuts. ? Eating low-fat dairy products. ? Avoiding foods that are high in salt (sodium). This can help lower blood pressure. ? Avoiding foods that have saturated fat, trans fat, and cholesterol. This can help prevent high cholesterol. ? Avoiding processed and  premade foods.  Follow your health care provider's specific guidelines for losing weight, controlling high blood pressure (hypertension), lowering high cholesterol, and managing diabetes. These may include: ? Reducing your daily calorie intake. ? Limiting your daily sodium intake to 1,500 milligrams (mg). ? Using only healthy fats for cooking, such as olive oil, canola oil, or sunflower oil. ? Counting your daily carbohydrate intake.   What lifestyle changes can be made?  Maintain a healthy weight. Talk to your health care provider about your ideal weight.  Get at least 30 minutes of moderate physical activity at least 5 days a week. Moderate activity includes brisk walking, biking, and swimming.  Do not use any products that contain nicotine or tobacco, such as cigarettes and e-cigarettes. If you need help quitting, ask your health care provider. It may also be helpful to avoid exposure to secondhand smoke.  Limit alcohol intake to no more than 1 drink a day for nonpregnant women and 2 drinks a day for men. One drink equals 12 oz of beer, 5 oz of wine, or 1 oz of hard liquor.  Stop any illegal drug use.  Avoid taking birth control pills. Talk to your health care provider about the risks of taking birth control pills if: ? You are over 66 years old. ? You smoke. ? You get migraines. ? You have ever had a blood clot. What other changes can be made?  Manage your cholesterol levels. ? Eating a healthy diet is important for preventing high cholesterol. If cholesterol cannot be managed through diet alone, you may also need to take medicines. ? Take any prescribed medicines to control your cholesterol as told  by your health care provider.  Manage your diabetes. ? Eating a healthy diet and exercising regularly are important parts of managing your blood sugar. If your blood sugar cannot be managed through diet and exercise, you may need to take medicines. ? Take any prescribed medicines to  control your diabetes as told by your health care provider.  Control your hypertension. ? To reduce your risk of stroke, try to keep your blood pressure below 130/80. ? Eating a healthy diet and exercising regularly are an important part of controlling your blood pressure. If your blood pressure cannot be managed through diet and exercise, you may need to take medicines. ? Take any prescribed medicines to control hypertension as told by your health care provider. ? Ask your health care provider if you should monitor your blood pressure at home. ? Have your blood pressure checked every year, even if your blood pressure is normal. Blood pressure increases with age and some medical conditions.  Get evaluated for sleep disorders (sleep apnea). Talk to your health care provider about getting a sleep evaluation if you snore a lot or have excessive sleepiness.  Take over-the-counter and prescription medicines only as told by your health care provider. Aspirin or blood thinners (antiplatelets or anticoagulants) may be recommended to reduce your risk of forming blood clots that can lead to stroke.  Make sure that any other medical conditions you have, such as atrial fibrillation or atherosclerosis, are managed. What are the warning signs of a stroke? The warning signs of a stroke can be easily remembered as BEFAST.  B is for balance. Signs include: ? Dizziness. ? Loss of balance or coordination. ? Sudden trouble walking.  E is for eyes. Signs include: ? A sudden change in vision. ? Trouble seeing.  F is for face. Signs include: ? Sudden weakness or numbness of the face. ? The face or eyelid drooping to one side.  A is for arms. Signs include: ? Sudden weakness or numbness of the arm, usually on one side of the body.  S is for speech. Signs include: ? Trouble speaking (aphasia). ? Trouble understanding.  T is for time. ? These symptoms may represent a serious problem that is an emergency.  Do not wait to see if the symptoms will go away. Get medical help right away. Call your local emergency services (911 in the [place] Do not drive yourself to the hospital.  Other signs of stroke may include: ? A sudden, severe headache with no known cause. ? Nausea or vomiting. ? Seizure. Where to find more information For more information, visit:  American Stroke Association: www.strokeassociation.org  National Stroke Association: www.stroke.org Summary  You can prevent a stroke by eating healthy, exercising, not smoking, limiting alcohol intake, and managing any medical conditions you may have.  Do not use any products that contain nicotine or tobacco, such as cigarettes and e-cigarettes. If you need help quitting, ask your health care provider. It may also be helpful to avoid exposure to secondhand smoke.  Remember BEFAST for warning signs of stroke. Get help right away if you or a loved one has any of these signs. This information is not intended to replace advice given to you by your health care provider. Make sure you discuss any questions you have with your health care provider. Document Revised: 10/21/2017 Document Reviewed: 12/14/2016 Elsevier Patient Education  2021 [employer].

## 2020-12-29 ENCOUNTER — Telehealth: Payer: Self-pay | Admitting: Neurology

## 2020-12-29 NOTE — Telephone Encounter (Signed)
12/29/2020 Called and spoke to patient she does not have insurance she told me to CX order TCD with Bubbles . Will you CX order in Epic please Thanks Annabelle Harman

## 2020-12-30 ENCOUNTER — Other Ambulatory Visit: Payer: Self-pay | Admitting: Neurology

## 2020-12-30 NOTE — Telephone Encounter (Signed)
Either is fine with me.  

## 2020-12-31 NOTE — Telephone Encounter (Signed)
thanks

## 2020-12-31 NOTE — Telephone Encounter (Signed)
Per Dr. Marlis Edelson request, TCD Doppler discontinued

## 2020-12-31 NOTE — Telephone Encounter (Signed)
Thanks Guys for your help Per Lupita Leash and Chilo at [place] the orders have to be CX because they stay in there work que . Thanks So much [employer]

## 2020-12-31 NOTE — Addendum Note (Signed)
Addended by: Raliegh Ip on: 12/31/2020 10:10 AM   Modules accepted: Orders

## 2021-01-28 ENCOUNTER — Ambulatory Visit: Payer: MEDICAID | Admitting: Neurology

## 2021-03-19 ENCOUNTER — Ambulatory Visit: Payer: Self-pay | Admitting: Adult Health

## 2021-03-19 ENCOUNTER — Encounter: Payer: Self-pay | Admitting: Adult Health

## 2021-03-19 VITALS — BP 155/85 | HR 81 | Ht 67.0 in | Wt 238.0 lb

## 2021-03-19 DIAGNOSIS — I639 Cerebral infarction, unspecified: Secondary | ICD-10-CM

## 2021-03-19 DIAGNOSIS — E118 Type 2 diabetes mellitus with unspecified complications: Secondary | ICD-10-CM

## 2021-03-19 DIAGNOSIS — E785 Hyperlipidemia, unspecified: Secondary | ICD-10-CM

## 2021-03-19 DIAGNOSIS — Z7409 Other reduced mobility: Secondary | ICD-10-CM

## 2021-03-19 DIAGNOSIS — I10 Essential (primary) hypertension: Secondary | ICD-10-CM

## 2021-03-19 DIAGNOSIS — E1165 Type 2 diabetes mellitus with hyperglycemia: Secondary | ICD-10-CM

## 2021-03-19 DIAGNOSIS — IMO0002 Reserved for concepts with insufficient information to code with codable children: Secondary | ICD-10-CM

## 2021-03-19 NOTE — Progress Notes (Signed)
[facility] [address] [place]. [place] [address] [phone]       OFFICE FOLLOW-UP NOTE  Ms. DELAYNA SPARLIN Date of Birth:  February 17, 1961 Medical Record Number:  [id number]    Chief Complaint  Patient presents with  . Follow-up    TR with daughter (casey) Pt is having weakness in legs, pain in back and short of breath       HPI:   Today, 03/19/2021, Ms. Gotay returns for 59-month stroke follow-up accompanied by her daughter, Baird Lyons  Her main concern today is in regards to lower extremity weakness typically after prolonged ambulation or standing.  She has been trying to increase her activity but has been having difficulty with limited activity tolerance.  She also has diabetic neuropathy which also interferes with ambulation.  She does ambulate without assistive device and denies any recent falls.  Denies any associated dizziness or vertigo or imbalance.  From a stroke standpoint, reports continued left peripheral visual loss but believes this has been slowly improving.  She is scheduled to see her established ophthalmologist 6/4 for first evaluation since her stroke.  She questions possible return to driving. Denies new stroke/TIA symptoms  Compliant on aspirin and atorvastatin 20 mg daily without associated side effects Glucose levels uncontrolled with extreme fluctuation of levels Blood pressure 155/85 - monitors at home and typically in good control  She was unable to obtain Medicaid reporting that her husband made too much.  Due to lack of insurance, she has not been able to complete TCD bubble or cardiac monitor due to financial reasons.  She has not been able to see her PCP since her stroke as she has been out of the office but has been following up with Dr. Lenise Arena who is in the same practice per patient and daughter wish to transfer to a different office.  No further concerns at this time     History provided for reference purposes only Initial  visit 12/17/2020 Dr. Pearlean Brownie: Ms. Wieland is 60 year old Caucasian lady seen today for initial office follow-up visit following hospital admission for stroke in November 2021.  She is accompanied by her daughter Baird Lyons and history is obtained from them, review of electronic medical records and I personally reviewed pertinent imaging films in PACS.  She has past medical history of diabetes, pretension, hyperlipidemia, COPD, obesity, who was initially admitted in July 2021 due to Covid infection and developed bilateral pulmonary embolism and was discharged on Xarelto but patient apparently stopped it without completing full 3 months.  She presented to [facility] on 10/11/2020 with new onset headache and balance difficulties.  This began 2 days prior to presentation.  CT scan of the head showed well-demarcated hypodensity involving the right posterior temporal occipital region as well as subacute right MCA territory stroke without any hemorrhage or mass-effect.  CT angiogram of the head and neck showed occluded right M2 branch and 60% stenosis of bilateral carotid bifurcations and atherosclerotic changes at both carotid siphons.  Transthoracic echo showed normal ejection fraction without cardiac source of embolism.  LDL cholesterol was elevated at 125 mg percent and hemoglobin A1c at 9.1.  Lower extremity venous Dopplers were negative for DVT.  CT scanning of the chest showed no evidence of pulmonary emboli.  Patient was started on aspirin Plavix for 3 weeks which she took and had stopped Plavix and is currently on aspirin alone.  She continues to have left-sided peripheral vision loss.  She also complains of getting tired more  easily and her stamina is not right.  She still continues to smoke but claims that she is cut back to half pack per day.  Her sugars have been persistently elevated she plans to see her medical doctor and get a referral to endocrinologist.  She has no new complaints.    ROS:   14  system review of systems is positive for those listed in HPI and all other systems negative  PMH:  Past Medical History:  Diagnosis Date  . COPD (chronic obstructive pulmonary disease) (HCC)   . Diabetes mellitus without complication (HCC)   . Kidney stone     Social History:  Social History   Socioeconomic History  . Marital status: Married    Spouse name: Not on file  . Number of children: Not on file  . Years of education: Not on file  . Highest education level: Not on file  Occupational History  . Occupation: unemployed  Tobacco Use  . Smoking status: Current Every Day Smoker    Packs/day: 1.00  . Smokeless tobacco: Never Used  Vaping Use  . Vaping Use: Never used  Substance and Sexual Activity  . Alcohol use: No  . Drug use: No  . Sexual activity: Not on file  Other Topics Concern  . Not on file  Social History Narrative   Lives with husband   Right handed   Drinks 3-5 cups of caffeine daily   Social Determinants of Health   Financial Resource Strain: Not on file  Food Insecurity: Not on file  Transportation Needs: Not on file  Physical Activity: Not on file  Stress: Not on file  Social Connections: Not on file  Intimate Partner Violence: Not on file    Medications:   Current Outpatient Medications on File Prior to Visit  Medication Sig Dispense Refill  . aspirin EC 81 MG tablet Take 81 mg by mouth daily. Swallow whole.    Marland Kitchen atorvastatin (LIPITOR) 20 MG tablet Take 1 tablet (20 mg total) by mouth daily. 30 tablet 0  . glipiZIDE (GLUCOTROL XL) 10 MG 24 hr tablet Take 1 tablet (10 mg total) by mouth daily with breakfast. 30 tablet 0  . insulin NPH Human (NOVOLIN N) 100 UNIT/ML injection Inject 20 Units into the skin 2 (two) times daily before a meal.    . pantoprazole (PROTONIX) 40 MG tablet Take 1 tablet (40 mg total) by mouth daily. 30 tablet 0  . pioglitazone (ACTOS) 45 MG tablet Take 1 tablet (45 mg total) by mouth daily. 30 tablet 0  . lisinopril  (ZESTRIL) 5 MG tablet Take 1 tablet (5 mg total) by mouth daily. 90 tablet 0   No current facility-administered medications on file prior to visit.    Allergies:   Allergies  Allergen Reactions  . Sulfa Antibiotics Shortness Of Breath and Swelling    All-over swelling  . Penicillins Rash    Physical Exam Today's Vitals   03/19/21 1304  BP: (!) 155/85  Pulse: 81  Weight: 238 lb (108 kg)  Height: 5\' 7"  (1.702 m)   Body mass index is 37.28 kg/m.    General: Obese pleasant middle-aged Caucasian lady, seated, in no evident distress Head: head normocephalic and atraumatic.  Neck: supple with no carotid or supraclavicular bruits Cardiovascular: regular rate and rhythm, no murmurs Musculoskeletal: no deformity Skin:  no rash/petichiae Vascular:  Normal pulses all extremities  Neurologic Exam Mental Status: Awake and fully alert.  Fluent speech and language.  Oriented to place  and time. Recent and remote memory intact. Attention span, concentration and fund of knowledge appropriate. Mood and affect appropriate.  Cranial Nerves: Pupils equal, briskly reactive to light. Extraocular movements full without nystagmus. Visual fields full show left inferior homonymous quadrantanopia to confrontation. Hearing intact. Facial sensation intact.  Mild left lower facial weakness.  Tongue and palate moves normally and symmetrically.  Motor: Normal bulk and tone. Normal strength in all tested extremity muscles except slightly decreased left hand dexterity and mild left hip flexor weakness Sensory.:  Decreased sensory bilaterally lower extremities distally with chronic diabetic neuropathy Coordination: Rapid alternating movements normal in all extremities except slightly decreased left hand. Finger-to-nose and heel-to-shin performed accurately bilaterally. Gait and Station: Arises from chair without difficulty. Stance is normal. Gait demonstrates broad-based waddling gait with mild imbalance without  use of assistive device.  Able to heel, toe and tandem walk with some difficulty.  Reflexes: 1+ and symmetric. Toes downgoing.       ASSESSMENT: 60 year old Caucasian lady with cryptogenic right MCA and left MCA and right cerebellar infarcts in November 2021 following Covid infection 4 months ago.  Vascular risk factors of smoking, hypertension, diabetes, hyperlipidemia, carotid stenosis, provoked bilateral PE and RLE DVT in setting of COVID infection 05/2020 with Xarelto noncompliance due to financial reasons, tobacco use and obesity   Plan: -Residual deficit: Left inferior homonymous quadrantanopia and mild left-sided weakness -plan on evaluation by ophthalmology 6/4 -advised to further discuss with ophthalmology for driving clearance in setting of visual loss -Gait impairment likely multifactorial in setting of decreased activity tolerance, generalized deconditioning, history of stroke and lower extremity neuropathy.  May consider PT if she is able to get [facility] financial assistance coverage.  Recommended use of Rollator walker for more long distance ambulation and fall prevention -Continue aspirin 81 mg daily and atorvastatin 20 mg daily for secondary stroke prevention -Needs to complete cardiac monitor and TCD to complete stroke work-up -provided information for [place] financial assistance program as she was denied Medicaid coverage -Referral placed to community health and wellness as well as endocrinology -Discussed secondary stroke prevention measures and importance of close PCP follow-up for aggressive stroke risk factor management including hypertension with blood pressure goal below 130/90, diabetes with hemoglobin A1c goal below 7% and lipids with LDL cholesterol goal below 70 mg/dL   Follow-up in 6 months or call earlier if needed    CC:  [facility] provider: Dr. Dava Najjar, Wille Celeste, DO      Ihor Austin, AGNP-BC  [facility] [address]  [address] [place], [place] [address]  Phone [phone] Fax [phone] Note: This document was prepared with digital dictation and possible smart phrase technology. Any transcriptional errors that result from this process are unintentional.

## 2021-03-19 NOTE — Patient Instructions (Signed)
Continue aspirin 81 mg daily  and atorvastatin  for secondary stroke prevention  Establish care with new PCP regarding cholesterol, blood pressure and diabetes management  Maintain strict control of hypertension with blood pressure goal below 130/90, diabetes with hemoglobin A1c goal below 7% and cholesterol with LDL cholesterol (bad cholesterol) goal below 70 mg/dL.   Referral placed to endocrinology     Followup in the future with me in 6 months or call earlier if needed       Thank you for coming to see [place] at [facility]. I hope we have been able to provide you high quality care today.  You may receive a patient satisfaction survey over the next few weeks. We would appreciate your feedback and comments so that we may continue to improve ourselves and the health of our patients.

## 2021-03-19 NOTE — Progress Notes (Signed)
I agree with the above plan 

## 2021-05-22 ENCOUNTER — Ambulatory Visit: Payer: Self-pay | Admitting: Endocrinology

## 2021-05-22 ENCOUNTER — Encounter: Payer: Self-pay | Admitting: Endocrinology

## 2021-05-22 ENCOUNTER — Other Ambulatory Visit: Payer: Self-pay

## 2021-05-22 VITALS — BP 100/60 | HR 92 | Ht 67.0 in | Wt 239.8 lb

## 2021-05-22 DIAGNOSIS — E119 Type 2 diabetes mellitus without complications: Secondary | ICD-10-CM

## 2021-05-22 DIAGNOSIS — Z794 Long term (current) use of insulin: Secondary | ICD-10-CM

## 2021-05-22 LAB — POCT GLYCOSYLATED HEMOGLOBIN (HGB A1C): Hemoglobin A1C: 9.9 % — AB (ref 4.0–5.6)

## 2021-05-22 MED ORDER — INSULIN NPH (HUMAN) (ISOPHANE) 100 UNIT/ML ~~LOC~~ SUSP
SUBCUTANEOUS | 3 refills | Status: DC
Start: 2021-05-22 — End: 2021-08-07

## 2021-05-22 NOTE — Patient Instructions (Addendum)
good diet and exercise significantly improve the control of your diabetes.  please let me know if you wish to be referred to a dietician.  high blood sugar is very risky to your health.  you should see an eye doctor and dentist every year.  It is very important to get all recommended vaccinations.  Controlling your blood pressure and cholesterol drastically reduces the damage diabetes does to your body.  Those who smoke should quit.  Please discuss these with your doctor.  check your blood sugar once a day.  vary the time of day when you check, between before the 3 meals, and at bedtime.  also check if you have symptoms of your blood sugar being too high or too low.  please keep a record of the readings and bring it to your next appointment here (or you can bring the meter itself).  You can write it on any piece of paper.  please call us sooner if your blood sugar goes below 70, or if most of your readings are over 200.   We will need to take this complex situation in stages. For now, please increase the insulin to 35 units in the morning, and 20 in the evening.  Please stop taking the pioglitazone.   Please continue the same glipizide for now, but we'll stop this with time.   Please come back for a follow-up appointment in 2 months.

## 2021-05-22 NOTE — Progress Notes (Signed)
Subjective:    Patient ID: Jill Barnes, adult    DOB: [birth year], 60 y.o.   MRN: [id number]  HPI pt is referred by Ihor Austin, NP, for diabetes.  Pt states DM was dx'ed in 2017 (she had GDM in 1988); it is complicated by CVA and PN; she has been on insulin since 2019; pt says her diet is good, but exercise is not good; she has never had pancreatitis, pancreatic surgery, severe hypoglycemia or DKA.  She has weight gain and fatigue.  I reviewed continuous glucose monitor data.  Glucose varies from 110-350.  It is in general lowest at 7AM.  It increases throughout the day, and decreases overnight.  Past Medical History:  Diagnosis Date   COPD (chronic obstructive pulmonary disease) (HCC)    Diabetes mellitus without complication (HCC)    Kidney stone     Past Surgical History:  Procedure Laterality Date   ABDOMINAL HYSTERECTOMY     BLADDER SURGERY      Social History   Socioeconomic History   Marital status: Married    Spouse name: Not on file   Number of children: Not on file   Years of education: Not on file   Highest education level: Not on file  Occupational History   Occupation: unemployed  Tobacco Use   Smoking status: Every Day    Packs/day: 1.00    Pack years: 0.00    Types: Cigarettes   Smokeless tobacco: Never  Vaping Use   Vaping Use: Never used  Substance and Sexual Activity   Alcohol use: No   Drug use: No   Sexual activity: Not on file  Other Topics Concern   Not on file  Social History Narrative   Lives with husband   Right handed   Drinks 3-5 cups of caffeine daily   Social Determinants of Health   Financial Resource Strain: Not on file  Food Insecurity: Not on file  Transportation Needs: Not on file  Physical Activity: Not on file  Stress: Not on file  Social Connections: Not on file  Intimate Partner Violence: Not on file    Current Outpatient Medications on File Prior to Visit  Medication Sig Dispense Refill   aspirin EC 81 MG  tablet Take 81 mg by mouth daily. Swallow whole.     atorvastatin (LIPITOR) 20 MG tablet Take 1 tablet (20 mg total) by mouth daily. 30 tablet 0   glipiZIDE (GLUCOTROL XL) 10 MG 24 hr tablet Take 1 tablet (10 mg total) by mouth daily with breakfast. 30 tablet 0   pantoprazole (PROTONIX) 40 MG tablet Take 1 tablet (40 mg total) by mouth daily. 30 tablet 0   lisinopril (ZESTRIL) 5 MG tablet Take 1 tablet (5 mg total) by mouth daily. 90 tablet 0   No current facility-administered medications on file prior to visit.    Allergies  Allergen Reactions   Sulfa Antibiotics Shortness Of Breath and Swelling    All-over swelling   Penicillins Rash    Family History  Problem Relation Age of Onset   Diabetes Mother    Heart failure Father    Diabetes Father     BP 100/60 (BP Location: Left Arm, Patient Position: Sitting, Cuff Size: Large)   Pulse 92   Ht 5\' 7"  (1.702 m)   Wt 239 lb 12.8 oz (108.8 kg)   SpO2 97%   BMI 37.56 kg/m     Review of Systems denies sob, n/v.  She has intermitt memory  loss.  She says depression is caused by CVA's.     Objective:   Physical Exam Pulses: dorsalis pedis intact bilat.   MSK: no deformity of the feet CV: 1+ bilat leg edema Skin:  no ulcer on the feet.  normal color and temp on the feet. Neuro: sensation is intact to touch on the feet Ext: there is bilateral onychomycosis of the toenails, which are long  Lab Results  Component Value Date   HGBA1C 9.9 (A) 05/22/2021   Lab Results  Component Value Date   CREATININE 1.05 (H) 10/13/2020   BUN 15 10/13/2020   NA 139 10/13/2020   K 3.9 10/13/2020   CL 103 10/13/2020   CO2 23 10/13/2020      Assessment & Plan:  Insulin-requiring type 2 DM: uncontrolled  Patient Instructions  good diet and exercise significantly improve the control of your diabetes.  please let me know if you wish to be referred to a dietician.  high blood sugar is very risky to your health.  you should see an eye doctor and  dentist every year.  It is very important to get all recommended vaccinations.  Controlling your blood pressure and cholesterol drastically reduces the damage diabetes does to your body.  Those who smoke should quit.  Please discuss these with your doctor.  check your blood sugar once a day.  vary the time of day when you check, between before the 3 meals, and at bedtime.  also check if you have symptoms of your blood sugar being too high or too low.  please keep a record of the readings and bring it to your next appointment here (or you can bring the meter itself).  You can write it on any piece of paper.  please call us sooner if your blood sugar goes below 70, or if most of your readings are over 200.   We will need to take this complex situation in stages. For now, please increase the insulin to 35 units in the morning, and 20 in the evening.  Please stop taking the pioglitazone.   Please continue the same glipizide for now, but we'll stop this with time.   Please come back for a follow-up appointment in 2 months.

## 2021-06-09 ENCOUNTER — Telehealth: Payer: Self-pay | Admitting: Endocrinology

## 2021-06-09 ENCOUNTER — Other Ambulatory Visit: Payer: Self-pay

## 2021-06-09 DIAGNOSIS — E119 Type 2 diabetes mellitus without complications: Secondary | ICD-10-CM

## 2021-06-09 MED ORDER — FREESTYLE LIBRE 14 DAY SENSOR MISC: 3 refills | Status: DC

## 2021-06-09 MED ORDER — GLIPIZIDE ER 10 MG PO TB24: 10.0000 mg | ORAL_TABLET | Freq: Every day | ORAL | 0 refills | Status: DC

## 2021-06-09 NOTE — Telephone Encounter (Signed)
Patient rx refills on glipizide 10mg , pantoprazole 40mg , atorvastain 20mg , freestayle libre sensors.  Called into [facility] store # [phone]

## 2021-06-09 NOTE — Telephone Encounter (Signed)
Please advise 

## 2021-06-09 NOTE — Telephone Encounter (Signed)
Patients glucose has been staying high, at time of call 7/19 around 12:05p 277. Patients states glucose has also been in 300's. Patient states medication that was removed because of her feet swelling has helped the swelling

## 2021-06-09 NOTE — Telephone Encounter (Signed)
Pt's Vm was full so I sent her a message thru MyChart

## 2021-06-09 NOTE — Telephone Encounter (Signed)
Are these medication okay to refill ,  you haven't written this medication for this pt before ,please advise

## 2021-06-10 NOTE — Telephone Encounter (Signed)
Message sent thru MyChart 

## 2021-06-15 ENCOUNTER — Emergency Department (HOSPITAL_BASED_OUTPATIENT_CLINIC_OR_DEPARTMENT_OTHER): Admission: EM | Admit: 2021-06-15 | Discharge: 2021-06-15 | Discharge status: Against Medical Advice | Payer: MEDICAID

## 2021-06-15 NOTE — ED Notes (Signed)
Pt requested to leave, states she feels some better and in hungry. She understands to return or follow up if she does not get better. IV removed from left lower arm prior to leaving.

## 2021-06-15 NOTE — ED Triage Notes (Signed)
Patient reports tot he ER via EMS:  Patient is coming in for fatigue, nausea and insulin non-addhearance. 285 CBG with EMS. Patient given 4mg  Zofran

## 2021-06-16 ENCOUNTER — Telehealth: Payer: Self-pay | Admitting: Endocrinology

## 2021-06-16 NOTE — Telephone Encounter (Signed)
Pt states ever since the Dr took her off one of her blood sugar medications her blood sugars have been running high in the 300s. Pt states yesterday she called the ambulance and when the EMS got there they looked at her meter and told her that her BS have been running high for too long. Pt states her BS at this time is 272 and she has not eaten anything.  Pt would like to know what to do to get the blood sugars down?  Callback # [phone]

## 2021-06-17 NOTE — Telephone Encounter (Signed)
Spoke with pt in regards to increasing her insulin to 45 in the mornings and 30 in the evening.

## 2021-06-30 ENCOUNTER — Other Ambulatory Visit: Payer: Self-pay | Admitting: Endocrinology

## 2021-06-30 DIAGNOSIS — Z794 Long term (current) use of insulin: Secondary | ICD-10-CM

## 2021-06-30 DIAGNOSIS — E119 Type 2 diabetes mellitus without complications: Secondary | ICD-10-CM

## 2021-07-26 ENCOUNTER — Encounter: Payer: Self-pay | Admitting: Endocrinology

## 2021-08-07 ENCOUNTER — Ambulatory Visit (INDEPENDENT_AMBULATORY_CARE_PROVIDER_SITE_OTHER): Payer: Self-pay | Admitting: Endocrinology

## 2021-08-07 ENCOUNTER — Other Ambulatory Visit: Payer: Self-pay

## 2021-08-07 VITALS — BP 140/82 | HR 91 | Ht 67.0 in | Wt 236.4 lb

## 2021-08-07 DIAGNOSIS — Z794 Long term (current) use of insulin: Secondary | ICD-10-CM

## 2021-08-07 DIAGNOSIS — E119 Type 2 diabetes mellitus without complications: Secondary | ICD-10-CM

## 2021-08-07 LAB — POCT GLYCOSYLATED HEMOGLOBIN (HGB A1C): Hemoglobin A1C: 10.2 % — AB (ref 4.0–5.6)

## 2021-08-07 MED ORDER — INSULIN NPH (HUMAN) (ISOPHANE) 100 UNIT/ML ~~LOC~~ SUSP
SUBCUTANEOUS | 3 refills | Status: DC
Start: 2021-08-07 — End: 2021-10-08

## 2021-08-07 NOTE — Progress Notes (Signed)
Subjective:    Patient ID: Jill Barnes, adult    DOB: Sep 17, 1961, 60 y.o.   MRN: [id number]  HPI Pt returns for f/u of diabetes mellitus: DM type: Insulin-requiring type 2 Dx'ed: 2017  Complications: CVA and PN Therapy: insulin since 2019, and glipizide GDM: 1988 DKA: never Severe hypoglycemia: never Pancreatitis: never Pancreatic imaging: normal on 2018 CT SDOH: she has no health insurance.   Other: she did not tolerate pioglitazone (edema) or metformin (weakness).   Interval history: I reviewed continuous glucose monitor data.  Glucose varies from 100-340.  It is in general lowest 6AM-11AM.  It increases throughout the day, and decreases overnight. She takes 45 units in the morning, and 35 in the evening.  pt states she feels well in general. Past Medical History:  Diagnosis Date   COPD (chronic obstructive pulmonary disease) (HCC)    Diabetes mellitus without complication (HCC)    Kidney stone     Past Surgical History:  Procedure Laterality Date   ABDOMINAL HYSTERECTOMY     BLADDER SURGERY      Social History   Socioeconomic History   Marital status: Married    Spouse name: Not on file   Number of children: Not on file   Years of education: Not on file   Highest education level: Not on file  Occupational History   Occupation: unemployed  Tobacco Use   Smoking status: Every Day    Packs/day: 1.00    Types: Cigarettes   Smokeless tobacco: Never  Vaping Use   Vaping Use: Never used  Substance and Sexual Activity   Alcohol use: No   Drug use: No   Sexual activity: Not on file  Other Topics Concern   Not on file  Social History Narrative   Lives with husband   Right handed   Drinks 3-5 cups of caffeine daily   Social Determinants of Health   Financial Resource Strain: Not on file  Food Insecurity: Not on file  Transportation Needs: Not on file  Physical Activity: Not on file  Stress: Not on file  Social Connections: Not on file  Intimate  Partner Violence: Not on file    Current Outpatient Medications on File Prior to Visit  Medication Sig Dispense Refill   aspirin EC 81 MG tablet Take 81 mg by mouth daily. Swallow whole.     atorvastatin (LIPITOR) 20 MG tablet Take 1 tablet (20 mg total) by mouth daily. 30 tablet 0   Continuous Blood Gluc Sensor (FREESTYLE LIBRE 14 DAY SENSOR) MISC Change every 14 days 6 each 3   glipiZIDE (GLUCOTROL XL) 10 MG 24 hr tablet Take 1 tablet by mouth once daily with breakfast 90 tablet 3   pantoprazole (PROTONIX) 40 MG tablet Take 1 tablet (40 mg total) by mouth daily. 30 tablet 0   lisinopril (ZESTRIL) 5 MG tablet Take 1 tablet (5 mg total) by mouth daily. 90 tablet 0   No current facility-administered medications on file prior to visit.    Allergies  Allergen Reactions   Sulfa Antibiotics Shortness Of Breath and Swelling    All-over swelling   Penicillins Rash    Family History  Problem Relation Age of Onset   Diabetes Mother    Heart failure Father    Diabetes Father     BP 140/82 (BP Location: Right Arm, Patient Position: Sitting, Cuff Size: Large)   Pulse 91   Ht 5\' 7"  (1.702 m)   Wt 236 lb 6.4 oz (107.2  kg)   SpO2 97%   BMI 37.03 kg/m    Review of Systems She denies hypoglycemia.  She has a tickle in her throat.      Objective:   Physical Exam Pulses: dorsalis pedis intact bilat.   MSK: no deformity of the feet CV: 1+ bilat leg edema Skin:  no ulcer on the feet.  normal color and temp on the feet. Neuro: sensation is intact to touch on the feet Ext: there is bilateral onychomycosis of the toenails  A1c=10.2%     Assessment & Plan:  Insulin-requiring type 2 DM: uncontrolled.  Throat sensation, poss due to lisinopril.  Patient Instructions  check your blood sugar once a day.  vary the time of day when you check, between before the 3 meals, and at bedtime.  also check if you have symptoms of your blood sugar being too high or too low.  please keep a record of  the readings and bring it to your next appointment here (or you can bring the meter itself).  You can write it on any piece of paper.  please call us sooner if your blood sugar goes below 70, or if most of your readings are over 200.   We will need to take this complex situation in stages.   For now, please increase the insulin to 70 units in the morning, and 25 in the evening.  Please continue the same glipizide for now, but we'll stop this with time.   Please ask your PCP if the tickle in your throat is due to the lisinopril.   Please come back for a follow-up appointment in 2 months.

## 2021-08-07 NOTE — Patient Instructions (Addendum)
check your blood sugar once a day.  vary the time of day when you check, between before the 3 meals, and at bedtime.  also check if you have symptoms of your blood sugar being too high or too low.  please keep a record of the readings and bring it to your next appointment here (or you can bring the meter itself).  You can write it on any piece of paper.  please call us sooner if your blood sugar goes below 70, or if most of your readings are over 200.   We will need to take this complex situation in stages.   For now, please increase the insulin to 70 units in the morning, and 25 in the evening.  Please continue the same glipizide for now, but we'll stop this with time.   Please ask your PCP if the tickle in your throat is due to the lisinopril.   Please come back for a follow-up appointment in 2 months.

## 2021-08-27 ENCOUNTER — Other Ambulatory Visit: Payer: Self-pay

## 2021-08-27 ENCOUNTER — Emergency Department (HOSPITAL_BASED_OUTPATIENT_CLINIC_OR_DEPARTMENT_OTHER)
Admission: EM | Admit: 2021-08-27 | Discharge: 2021-08-27 | Disposition: A | Discharge status: Home / Self Care | Payer: 59 | Attending: Emergency Medicine | Admitting: Emergency Medicine

## 2021-08-27 ENCOUNTER — Encounter (HOSPITAL_BASED_OUTPATIENT_CLINIC_OR_DEPARTMENT_OTHER): Payer: Self-pay | Admitting: Emergency Medicine

## 2021-08-27 ENCOUNTER — Emergency Department (HOSPITAL_BASED_OUTPATIENT_CLINIC_OR_DEPARTMENT_OTHER): Payer: 59

## 2021-08-27 DIAGNOSIS — R42 Dizziness and giddiness: Secondary | ICD-10-CM | POA: Insufficient documentation

## 2021-08-27 DIAGNOSIS — Z794 Long term (current) use of insulin: Secondary | ICD-10-CM | POA: Insufficient documentation

## 2021-08-27 DIAGNOSIS — R519 Headache, unspecified: Secondary | ICD-10-CM | POA: Diagnosis not present

## 2021-08-27 DIAGNOSIS — F17210 Nicotine dependence, cigarettes, uncomplicated: Secondary | ICD-10-CM | POA: Insufficient documentation

## 2021-08-27 DIAGNOSIS — I5031 Acute diastolic (congestive) heart failure: Secondary | ICD-10-CM | POA: Insufficient documentation

## 2021-08-27 DIAGNOSIS — J441 Chronic obstructive pulmonary disease with (acute) exacerbation: Secondary | ICD-10-CM | POA: Insufficient documentation

## 2021-08-27 DIAGNOSIS — Z7982 Long term (current) use of aspirin: Secondary | ICD-10-CM | POA: Diagnosis not present

## 2021-08-27 DIAGNOSIS — Z7984 Long term (current) use of oral hypoglycemic drugs: Secondary | ICD-10-CM | POA: Insufficient documentation

## 2021-08-27 DIAGNOSIS — E1165 Type 2 diabetes mellitus with hyperglycemia: Secondary | ICD-10-CM | POA: Diagnosis not present

## 2021-08-27 DIAGNOSIS — R739 Hyperglycemia, unspecified: Secondary | ICD-10-CM

## 2021-08-27 DIAGNOSIS — Z8616 Personal history of COVID-19: Secondary | ICD-10-CM | POA: Diagnosis not present

## 2021-08-27 LAB — COMPREHENSIVE METABOLIC PANEL
ALT: 30 U/L (ref 0–44)
AST: 32 U/L (ref 15–41)
Albumin: 3.4 g/dL — ABNORMAL LOW (ref 3.5–5.0)
Alkaline Phosphatase: 104 U/L (ref 38–126)
Anion gap: 7 (ref 5–15)
BUN: 13 mg/dL (ref 6–20)
CO2: 25 mmol/L (ref 22–32)
Calcium: 9 mg/dL (ref 8.9–10.3)
Chloride: 102 mmol/L (ref 98–111)
Creatinine, Ser: 0.87 mg/dL (ref 0.44–1.00)
GFR, Estimated: >60 mL/min (ref >60)
Glucose, Bld: 333 mg/dL — ABNORMAL HIGH (ref 70–99)
Potassium: 3.7 mmol/L (ref 3.5–5.1)
Sodium: 134 mmol/L — ABNORMAL LOW (ref 135–145)
Total Bilirubin: 0.3 mg/dL (ref 0.3–1.2)
Total Protein: 7.6 g/dL (ref 6.5–8.1)

## 2021-08-27 LAB — URINALYSIS, ROUTINE W REFLEX MICROSCOPIC
Bilirubin Urine: NEGATIVE
Glucose, UA: >=500 mg/dL — AB
Ketones, ur: NEGATIVE mg/dL
Leukocytes,Ua: NEGATIVE
Nitrite: NEGATIVE
Protein, ur: NEGATIVE mg/dL
Specific Gravity, Urine: 1.025 (ref 1.005–1.030)
pH: 5.5 (ref 5.0–8.0)

## 2021-08-27 LAB — CBC WITH DIFFERENTIAL/PLATELET
Abs Immature Granulocytes: 0.06 10*3/uL (ref 0.00–0.07)
Basophils Absolute: 0.1 10*3/uL (ref 0.0–0.1)
Basophils Relative: 1 %
Eosinophils Absolute: 0.3 10*3/uL (ref 0.0–0.5)
Eosinophils Relative: 3 %
HCT: 47.5 % — ABNORMAL HIGH (ref 36.0–46.0)
Hemoglobin: 15.8 g/dL — ABNORMAL HIGH (ref 12.0–15.0)
Immature Granulocytes: 1 %
Lymphocytes Relative: 25 %
Lymphs Abs: 2.8 10*3/uL (ref 0.7–4.0)
MCH: 30.7 pg (ref 26.0–34.0)
MCHC: 33.3 g/dL (ref 30.0–36.0)
MCV: 92.4 fL (ref 80.0–100.0)
Monocytes Absolute: 0.7 10*3/uL (ref 0.1–1.0)
Monocytes Relative: 7 %
Neutro Abs: 7.3 10*3/uL (ref 1.7–7.7)
Neutrophils Relative %: 63 %
Platelets: 287 10*3/uL (ref 150–400)
RBC: 5.14 MIL/uL — ABNORMAL HIGH (ref 3.87–5.11)
RDW: 12.9 % (ref 11.5–15.5)
WBC: 11.2 10*3/uL — ABNORMAL HIGH (ref 4.0–10.5)
nRBC: 0 % (ref 0.0–0.2)

## 2021-08-27 LAB — URINALYSIS, MICROSCOPIC (REFLEX)

## 2021-08-27 LAB — CBG MONITORING, ED
Glucose-Capillary: 329 mg/dL — ABNORMAL HIGH (ref 70–99)
Glucose-Capillary: 275 mg/dL — ABNORMAL HIGH (ref 70–99)

## 2021-08-27 MED ORDER — INSULIN ASPART 100 UNIT/ML IJ SOLN
8.0000 [IU] | Freq: Once | INTRAMUSCULAR | Status: AC
  Administered 2021-08-27: 8 [IU] via INTRAVENOUS

## 2021-08-27 MED ORDER — SODIUM CHLORIDE 0.9 % IV BOLUS
1000.0000 mL | Freq: Once | INTRAVENOUS | Status: AC
  Administered 2021-08-27: 1000 mL via INTRAVENOUS

## 2021-08-27 MED ORDER — IBUPROFEN 600 MG PO TABS: 600.0000 mg | ORAL_TABLET | Freq: Three times a day (TID) | ORAL | 0 refills | Status: DC | PRN

## 2021-08-27 MED ORDER — METOCLOPRAMIDE HCL 10 MG PO TABS: 10.0000 mg | ORAL_TABLET | Freq: Three times a day (TID) | ORAL | 0 refills | Status: DC | PRN

## 2021-08-27 MED ORDER — KETOROLAC TROMETHAMINE 15 MG/ML IJ SOLN
15.0000 mg | Freq: Once | INTRAMUSCULAR | Status: AC
  Administered 2021-08-27: 15 mg via INTRAVENOUS
  Filled 2021-08-27: qty 1

## 2021-08-27 NOTE — ED Triage Notes (Signed)
Brought by ems for c/o headache and feeling shaky.  Hand shaking initially but when asked to lay it down and be still able to do so despite patient reporting it is uncontrollable.  Per family had covid last year and had a stroke after that.  Dealing with long term covid.  CBG-350.  Per daughter patient has not been taking her medications correctly.

## 2021-08-27 NOTE — Discharge Instructions (Addendum)
Continue taking home medications as prescribed. If you have severe worsening headache or any associated symptoms such as the difficulty keeping her eyes open, take a combination of the ibuprofen and Reglan together. Follow-up with her primary care doctor for recheck of your symptoms. Follow-up with your endocrinologist for further evaluation and management of your high blood sugars. Return to emergency room with any new, worsening, concerning symptoms

## 2021-08-27 NOTE — ED Provider Notes (Signed)
[facility] EMERGENCY DEPARTMENT Provider Note   CSN: [id number] Arrival date & time: 08/27/21  1734     History Chief Complaint  Patient presents with   Headache    Jill Barnes is a 60 y.o. adult presenting for evaluation of headache and dizziness.  History provided by patient and daughter. Patient states she developed a headache today.  Per daughter, this is not new for her, however her headaches seem different or worse than normal.  Patient was keeping her eyes closed, but reaching out in front of her, however on further investigation she often closes her eyes when she has headaches.  She has not taken anything for her symptoms.  Additionally, her blood sugars have been elevated this past week, in the 350s, normally lives closer to 200.  No fevers.  No new cough, chest pain, shortness of breath, nausea, vomiting, abdominal pain, urinary symptoms, normal bowel movements.  No new medications.  Patient's insulin regimen recently was increased to 70 in the morning, 25 at night, patient states she is compliant with this.  No fall, trauma, or injury.  No one else at home is sick.  Patient has had a lot of issues with headaches and weakness since having COVID last year and subsequent stroke.  HPI     Past Medical History:  Diagnosis Date   COPD (chronic obstructive pulmonary disease) (HCC)    Diabetes mellitus without complication (HCC)    Kidney stone     Patient Active Problem List   Diagnosis Date Noted   Ischemic stroke (HCC) 10/11/2020   CVA (cerebral vascular accident) (HCC) 10/11/2020   Acute diastolic CHF (congestive heart failure) (HCC) 06/29/2020   Obesity (BMI 30-39.9) 06/28/2020   Acute deep vein thrombosis (DVT) of calf muscle vein of right lower extremity (HCC) 06/28/2020   Pneumonia due to COVID-19 virus 06/28/2020   Tobacco abuse 06/28/2020   Diabetes mellitus type 2, uncontrolled, with complications 06/28/2020   Bilateral pulmonary embolism (HCC)  06/27/2020   Respiratory failure (HCC) 06/27/2020   Pulmonary embolism (HCC) 06/27/2020   Acute hypoxemic respiratory failure due to COVID-19 (HCC) 06/20/2020   Type 2 diabetes mellitus without complication (HCC) 06/20/2020   Hypokalemia 06/20/2020   Acute respiratory failure with hypoxia (HCC) 06/19/2020   Hypoxia 12/15/2017   COPD exacerbation (HCC) 12/15/2017   Tachycardia 12/15/2017   Nicotine dependence 12/15/2017    Past Surgical History:  Procedure Laterality Date   ABDOMINAL HYSTERECTOMY     BLADDER SURGERY       OB History   No obstetric history on file.     Family History  Problem Relation Age of Onset   Diabetes Mother    Heart failure Father    Diabetes Father     Social History   Tobacco Use   Smoking status: Every Day    Packs/day: 1.00    Types: Cigarettes   Smokeless tobacco: Never  Vaping Use   Vaping Use: Never used  Substance Use Topics   Alcohol use: No   Drug use: No    Home Medications Prior to Admission medications   Medication Sig Start Date End Date Taking? Authorizing Provider  ibuprofen (ADVIL) 600 MG tablet Take 1 tablet (600 mg total) by mouth every 8 (eight) hours as needed. 08/27/21  Yes Murel Shenberger, PA-C  metoCLOPramide (REGLAN) 10 MG tablet Take 1 tablet (10 mg total) by mouth every 8 (eight) hours as needed for nausea. 08/27/21  Yes Candler Ginsberg, PA-C  aspirin EC  81 MG tablet Take 81 mg by mouth daily. Swallow whole.    [provider]  atorvastatin (LIPITOR) 20 MG tablet Take 1 tablet (20 mg total) by mouth daily. 07/02/20   Elgergawy, Leana Roe, MD  Continuous Blood Gluc Sensor (FREESTYLE LIBRE 14 DAY SENSOR) MISC Change every 14 days 06/09/21   Romero Belling, MD  glipiZIDE (GLUCOTROL XL) 10 MG 24 hr tablet Take 1 tablet by mouth once daily with breakfast 07/01/21   Romero Belling, MD  insulin NPH Human (NOVOLIN N) 100 UNIT/ML injection 70 units each morning, and 25 units in the evening. 08/07/21   Romero Belling, MD   lisinopril (ZESTRIL) 5 MG tablet Take 1 tablet (5 mg total) by mouth daily. 10/13/20 01/11/21  Lorin Glass, MD  pantoprazole (PROTONIX) 40 MG tablet Take 1 tablet (40 mg total) by mouth daily. 07/03/20   Elgergawy, Leana Roe, MD    Allergies    Sulfa antibiotics and Penicillins  Review of Systems   Review of Systems  Neurological:  Positive for weakness and headaches.  All other systems reviewed and are negative.  Physical Exam Updated Vital Signs BP 131/88   Pulse 80   Temp 97.8 F (36.6 C) (Oral)   Resp 15   Ht 5\' 7"  (1.702 m)   Wt 108 kg   SpO2 96%   BMI 37.28 kg/m   Physical Exam Vitals and nursing note reviewed.  Constitutional:      General: She is not in acute distress.    Appearance: Normal appearance.     Comments: nontoxic  HENT:     Head: Normocephalic and atraumatic.  Eyes:     Extraocular Movements: Extraocular movements intact.     Conjunctiva/sclera: Conjunctivae normal.     Pupils: Pupils are equal, round, and reactive to light.  Cardiovascular:     Rate and Rhythm: Normal rate and regular rhythm.     Pulses: Normal pulses.  Pulmonary:     Effort: Pulmonary effort is normal. No respiratory distress.     Breath sounds: Normal breath sounds. No wheezing.     Comments: Speaking in full sentences.  Clear lung sounds in all fields. Abdominal:     General: There is no distension.     Palpations: Abdomen is soft. There is no mass.     Tenderness: There is no abdominal tenderness. There is no guarding or rebound.  Musculoskeletal:        General: Normal range of motion.     Cervical back: Normal range of motion and neck supple.  Skin:    General: Skin is warm and dry.     Capillary Refill: Capillary refill takes less than 2 seconds.  Neurological:     Mental Status: She is alert and oriented to person, place, and time.     GCS: GCS eye subscore is 4. GCS verbal subscore is 5. GCS motor subscore is 6.     Cranial Nerves: Cranial nerves are intact.      Sensory: Sensation is intact.     Motor: Motor function is intact.     Comments: No focal neurologic deficits  Psychiatric:        Mood and Affect: Mood and affect normal.        Speech: Speech normal.        Behavior: Behavior normal.    ED Results / Procedures / Treatments   Labs (all labs ordered are listed, but only abnormal results are displayed) Labs Reviewed  CBC WITH  DIFFERENTIAL/PLATELET - Abnormal; Notable for the following components:      Result Value   WBC 11.2 (*)    RBC 5.14 (*)    Hemoglobin 15.8 (*)    HCT 47.5 (*)    All other components within normal limits  COMPREHENSIVE METABOLIC PANEL - Abnormal; Notable for the following components:   Sodium 134 (*)    Glucose, Bld 333 (*)    Albumin 3.4 (*)    All other components within normal limits  URINALYSIS, ROUTINE W REFLEX MICROSCOPIC - Abnormal; Notable for the following components:   Glucose, UA >=500 (*)    Hgb urine dipstick TRACE (*)    All other components within normal limits  URINALYSIS, MICROSCOPIC (REFLEX) - Abnormal; Notable for the following components:   Bacteria, UA FEW (*)    All other components within normal limits  CBG MONITORING, ED - Abnormal; Notable for the following components:   Glucose-Capillary 329 (*)    All other components within normal limits  CBG MONITORING, ED - Abnormal; Notable for the following components:   Glucose-Capillary 275 (*)    All other components within normal limits    EKG None  Radiology DG Chest 2 View  Result Date: 08/27/2021 CLINICAL DATA:  Hyperglycemia EXAM: CHEST - 2 VIEW COMPARISON:  10/11/2020 FINDINGS: The heart size and mediastinal contours are within normal limits. Improved left lower lung opacity. No new focal opacity. No pleural effusion or pneumothorax. No acute osseous abnormality. IMPRESSION: No active cardiopulmonary disease. Electronically Signed   By: Wiliam Ke M.D.   On: 08/27/2021 19:50   CT Head Wo Contrast  Result Date:  08/27/2021 CLINICAL DATA:  Mental status change. EXAM: CT HEAD WITHOUT CONTRAST TECHNIQUE: Contiguous axial images were obtained from the base of the skull through the vertex without intravenous contrast. COMPARISON:  10/11/2020 FINDINGS: Brain: Expected evolution of previously noted right MCA territory infarct, with encephalomalacia in the right temporal and parietal lobe, with ex vacuo dilatation of the occipital horn of the right lateral ventricle. No acute infarction, hemorrhage, mass, mass effect, or midline shift. No hydrocephalus or extra-axial collection. Periventricular white matter changes, likely the sequela of chronic small vessel ischemic disease. Vascular: No hyperdense vessel. Atherosclerotic calcifications in the intracranial carotid and vertebral arteries. Skull: Normal. Negative for fracture or focal lesion. Sinuses/Orbits: Mucous retention cysts in the left maxillary sinus. Mucosal thickening in right anterior ethmoid air cells and right frontal sinus. The orbits are unremarkable. Other: The mastoids are well aerated. IMPRESSION: No acute intracranial process. Expected evolution of previously noted right MCA territory infarct. Electronically Signed   By: Wiliam Ke M.D.   On: 08/27/2021 19:53    Procedures Procedures   Medications Ordered in ED Medications  ketorolac (TORADOL) 15 MG/ML injection 15 mg (15 mg Intravenous Given 08/27/21 2016)  sodium chloride 0.9 % bolus 1,000 mL ( Intravenous Stopped 08/27/21 2122)  insulin aspart (novoLOG) injection 8 Units (8 Units Intravenous Given 08/27/21 2025)    ED Course  I have reviewed the triage vital signs and the nursing notes.  Pertinent labs & imaging results that were available during my care of the patient were reviewed by me and considered in my medical decision making (see chart for details).    MDM Rules/Calculators/A&P                           Patient presenting for evaluation of headache and generalized weakness.  On  exam, patient  appears nontoxic.  No focal neurologic deficits.  She is keeping her eyes closed, however she is able to open them and track without difficulty.  States opening her eyes makes her head hurt worse, consider photophobia in the setting of migraine.  However considering patient's history, will obtain labs to ensure no DKA or electrolyte abnormalities.  Will obtain urine and chest x-ray to rule out infection.  CT head to rule out intracranial abnormalities.  Labs interpreted by me, overall reassuring.  There is hyperglycemia, but no acidosis or evidence of DKA.  Urine without infection.  Chest x-ray viewed and independently interpreted by me, no pneumonia, pnx, effusion.  CT head negative for acute findings.  Will treat with toradol, fluids, insulin, and reassess.  On reevaluation, patient reports significant improvement of headache.  She has her eyes open and does not appear in any distress.  CBG improved with insulin and fluids.  I discussed importance of taking insulin as prescribed and close monitoring of blood sugars and follow-up with endocrinologist.  Will treat future headaches which appear migraine-like in that there appears to be associated photophobia with ibuprofen and Reglan.  Discussed importance of follow-up with PCP as needed for further evaluation.  At this time, patient appears safe for discharge.  Return precautions given.  Patient and daughter state they understand and agree to plan.  Final Clinical Impression(s) / ED Diagnoses Final diagnoses:  Nonintractable headache, unspecified chronicity pattern, unspecified headache type  Hyperglycemia    Rx / DC Orders ED Discharge Orders          Ordered    metoCLOPramide (REGLAN) 10 MG tablet  Every 8 hours PRN        08/27/21 2155    ibuprofen (ADVIL) 600 MG tablet  Every 8 hours PRN        08/27/21 2155             Alveria Apley, PA-C 08/27/21 2352    Gloris Manchester, MD 08/30/21 1429

## 2021-09-24 ENCOUNTER — Encounter: Payer: Self-pay | Admitting: Adult Health

## 2021-09-24 ENCOUNTER — Ambulatory Visit (INDEPENDENT_AMBULATORY_CARE_PROVIDER_SITE_OTHER): Payer: Self-pay | Admitting: Adult Health

## 2021-09-24 VITALS — BP 147/82 | HR 73 | Ht 66.0 in | Wt 235.0 lb

## 2021-09-24 DIAGNOSIS — I639 Cerebral infarction, unspecified: Secondary | ICD-10-CM

## 2021-09-24 DIAGNOSIS — E785 Hyperlipidemia, unspecified: Secondary | ICD-10-CM

## 2021-09-24 DIAGNOSIS — I10 Essential (primary) hypertension: Secondary | ICD-10-CM

## 2021-09-24 NOTE — Patient Instructions (Signed)
Continue aspirin 81 mg daily  and atorvastatin 20mg  daily  for secondary stroke prevention  Continue to follow up with PCP/endocrinology regarding cholesterol, blood pressure and diabetes management  Maintain strict control of hypertension with blood pressure goal below 130/90, diabetes with hemoglobin A1c goal below 7.0% and cholesterol with LDL cholesterol (bad cholesterol) goal below 70 mg/dL.   Please let me know if you would like to be referred to pulmonology for evaluation   Continue to monitor your headaches - if they should worsen or you would like to start on medication management, please let me know        Thank you for coming to see at [facility]. I hope we have been able to provide you high quality care today.  You may receive a patient satisfaction survey over the next few weeks. We would appreciate your feedback and comments so that we may continue to improve ourselves and the health of our patients.

## 2021-09-24 NOTE — Progress Notes (Signed)
[facility] [address] [place]. [place] [address] [phone]       OFFICE FOLLOW-UP NOTE  [name] Date of Birth:  10/27/1961 Medical Record Number:  [id number]    Chief Complaint  Patient presents with   Follow-up    Rm 3 with daughter Jill Barnes  Pt is well, still having weakness in legs and short of breath as last visit. No new stroke symptoms       HPI:   Update 09/24/2021 JM:   Overall stable from stroke standpoint. Denies new stroke/TIA symptoms.  Reports being seen by ophthalmology although did not have peripheral field test completed - told completely recovered from stroke standpoint  At first was upset that she has not been cleared to return back to driving but then after further discussion, ophthalmology did clear her to return to drive but she does not feel comfortable driving yet Denies new stroke/TIA symptoms since prior visit   C/o headaches present since her stroke - ED eval 10/6 for severe migraine headache with confusion and dizziness. Symptoms resolved after resolution of migraine headache - CTH unremarkable. Has not had similar headache since that time. Reports 2 headaches over the past month - will resolve after use of Advil and laying in dark quite room.   Remains on aspirin and atorvastatin - denies side effects Blood pressure today 147/82 - plans on obtaining a BP cuff to monitor at home She has since establish care with endocrinologist Dr. Everardo All - recent A1c 10.2  She reports continued difficulty with long distance ambulation due to intolerance and shortness of breath complaints. She continues to have a cough which she attributes to lisinopril. She has not yet spoke to her PCP regarding this. She does prior dx of COPD but not actively treated. She is not established with a pulmonologist.  Reports worsening depression/anxiety due to inability to return back to work and feels like she has lost her independence   No further  concerns at this time    History provided for refernece purposes onl y Update 03/19/2021 JM: [name] returns for 59-month stroke follow-up accompanied by her daughter, Jill Barnes  Her main concern today is in regards to lower extremity weakness typically after prolonged ambulation or standing.  She has been trying to increase her activity but has been having difficulty with limited activity tolerance.  She also has diabetic neuropathy which also interferes with ambulation.  She does ambulate without assistive device and denies any recent falls.  Denies any associated dizziness or vertigo or imbalance.  From a stroke standpoint, reports continued left peripheral visual loss but believes this has been slowly improving.  She is scheduled to see her established ophthalmologist 6/4 for first evaluation since her stroke.  She questions possible return to driving. Denies new stroke/TIA symptoms  Compliant on aspirin and atorvastatin 20 mg daily without associated side effects Glucose levels uncontrolled with extreme fluctuation of levels Blood pressure 155/85 - monitors at home and typically in good control  She was unable to obtain Medicaid reporting that her husband made too much.  Due to lack of insurance, she has not been able to complete TCD bubble or cardiac monitor due to financial reasons.  She has not been able to see her PCP since her stroke as she has been out of the office but has been following up with Dr. Lenise Arena who is in the same practice per patient and daughter wish to transfer to a different office.  No further  concerns at this time  Initial visit 12/17/2020 Dr. Pearlean Brownie: [name] is 60 year old Caucasian lady seen today for initial office follow-up visit following hospital admission for stroke in November 2021.  She is accompanied by her daughter Jill Barnes and history is obtained from them, review of electronic medical records and I personally reviewed pertinent imaging films in PACS.  She  has past medical history of diabetes, pretension, hyperlipidemia, COPD, obesity, who was initially admitted in July 2021 due to Covid infection and developed bilateral pulmonary embolism and was discharged on Xarelto but patient apparently stopped it without completing full 3 months.  She presented to [facility] on 10/11/2020 with new onset headache and balance difficulties.  This began 2 days prior to presentation.  CT scan of the head showed well-demarcated hypodensity involving the right posterior temporal occipital region as well as subacute right MCA territory stroke without any hemorrhage or mass-effect.  CT angiogram of the head and neck showed occluded right M2 branch and 60% stenosis of bilateral carotid bifurcations and atherosclerotic changes at both carotid siphons.  Transthoracic echo showed normal ejection fraction without cardiac source of embolism.  LDL cholesterol was elevated at 125 mg percent and hemoglobin A1c at 9.1.  Lower extremity venous Dopplers were negative for DVT.  CT scanning of the chest showed no evidence of pulmonary emboli.  Patient was started on aspirin Plavix for 3 weeks which she took and had stopped Plavix and is currently on aspirin alone.  She continues to have left-sided peripheral vision loss.  She also complains of getting tired more easily and her stamina is not right.  She still continues to smoke but claims that she is cut back to half pack per day.  Her sugars have been persistently elevated she plans to see her medical doctor and get a referral to endocrinologist.  She has no new complaints.    ROS:   14 system review of systems is positive for those listed in HPI and all other systems negative  PMH:  Past Medical History:  Diagnosis Date   COPD (chronic obstructive pulmonary disease) (HCC)    Diabetes mellitus without complication (HCC)    Kidney stone     Social History:  Social History   Socioeconomic History   Marital status: Married     Spouse name: Not on file   Number of children: Not on file   Years of education: Not on file   Highest education level: Not on file  Occupational History   Occupation: unemployed  Tobacco Use   Smoking status: Every Day    Packs/day: 1.00    Types: Cigarettes   Smokeless tobacco: Never  Vaping Use   Vaping Use: Never used  Substance and Sexual Activity   Alcohol use: No   Drug use: No   Sexual activity: Not on file  Other Topics Concern   Not on file  Social History Narrative   Lives with husband   Right handed   Drinks 3-5 cups of caffeine daily   Social Determinants of Health   Financial Resource Strain: Not on file  Food Insecurity: Not on file  Transportation Needs: Not on file  Physical Activity: Not on file  Stress: Not on file  Social Connections: Not on file  Intimate Partner Violence: Not on file    Medications:   Current Outpatient Medications on File Prior to Visit  Medication Sig Dispense Refill   aspirin EC 81 MG tablet Take 81 mg by mouth daily. Swallow  whole.     atorvastatin (LIPITOR) 20 MG tablet Take 1 tablet (20 mg total) by mouth daily. 30 tablet 0   Continuous Blood Gluc Sensor (FREESTYLE LIBRE 14 DAY SENSOR) MISC Change every 14 days 6 each 3   glipiZIDE (GLUCOTROL XL) 10 MG 24 hr tablet Take 1 tablet by mouth once daily with breakfast 90 tablet 3   ibuprofen (ADVIL) 600 MG tablet Take 1 tablet (600 mg total) by mouth every 8 (eight) hours as needed. 15 tablet 0   insulin NPH Human (NOVOLIN N) 100 UNIT/ML injection 70 units each morning, and 25 units in the evening. 60 mL 3   lisinopril (ZESTRIL) 5 MG tablet Take 1 tablet (5 mg total) by mouth daily. 90 tablet 0   metoCLOPramide (REGLAN) 10 MG tablet Take 1 tablet (10 mg total) by mouth every 8 (eight) hours as needed for nausea. 15 tablet 0   pantoprazole (PROTONIX) 40 MG tablet Take 1 tablet (40 mg total) by mouth daily. 30 tablet 0   No current facility-administered medications on file prior  to visit.    Allergies:   Allergies  Allergen Reactions   Sulfa Antibiotics Shortness Of Breath and Swelling    All-over swelling   Penicillins Rash    Physical Exam Today's Vitals   09/24/21 1332  BP: (!) 147/82  Pulse: 73  Weight: 235 lb (106.6 kg)  Height: 5\' 6"  (1.676 m)   Body mass index is 37.93 kg/m.    General: Obese pleasant middle-aged Caucasian lady, seated, in no evident distress Head: head normocephalic and atraumatic.  Neck: supple with no carotid or supraclavicular bruits Cardiovascular: regular rate and rhythm, no murmurs Musculoskeletal: no deformity Skin:  no rash/petichiae Vascular:  Normal pulses all extremities  Neurologic Exam Mental Status: Awake and fully alert.  Fluent speech and language.  Oriented to place and time. Recent and remote memory intact. Attention span, concentration and fund of knowledge appropriate. Mood and affect appropriate.  Cranial Nerves: Pupils equal, briskly reactive to light. Extraocular movements full without nystagmus. Visual fields full show left inferior homonymous quadrantanopia to confrontation. Hearing intact. Facial sensation intact.  Mild left lower facial weakness.  Tongue and palate moves normally and symmetrically.  Motor: Normal bulk and tone. Normal strength in all tested extremity muscles except slightly decreased left hand dexterity and mild left hip flexor weakness Sensory.:  Decreased sensory bilaterally lower extremities distally with chronic diabetic neuropathy Coordination: Rapid alternating movements normal in all extremities except slightly decreased left hand. Finger-to-nose and heel-to-shin performed accurately bilaterally. Gait and Station: Arises from chair without difficulty. Stance is normal. Gait demonstrates broad-based waddling gait with mild imbalance without use of assistive device.  Able to heel, toe and tandem walk with some difficulty.  Reflexes: 1+ and symmetric. Toes downgoing.        ASSESSMENT: 60 year old Caucasian lady with cryptogenic right MCA and left MCA and right cerebellar infarcts in November 2021 following Covid infection 4 months ago.  Vascular risk factors of smoking, hypertension, diabetes, hyperlipidemia, carotid stenosis, provoked bilateral PE and RLE DVT in setting of COVID infection 05/2020 with Xarelto noncompliance due to financial reasons, tobacco use and obesity   Plan: -Residual deficit: Left inferior homonymous quadrantanopia - stable since prior visit -Gait impairment with intolerance likely multifactorial in setting of decreased activity tolerance, generalized deconditioning, history of stroke, hx of COPD and lower extremity neuropathy.  She is not actively insured to participate in therapies -Continue aspirin 81 mg daily and atorvastatin 20 mg  daily for secondary stroke prevention -Needs to complete cardiac monitor and TCD to complete stroke work-up -previously provided information for [facility] financial assistance program as she was denied Medicaid coverage which she reports she was denied for - unable to afford proceeding with further work up -continue to follow with endocrinology for diabetic management -advised to f/u with PCP regarding multiple other concerns at todays visit  -Discussed secondary stroke prevention measures and importance of close PCP follow-up for aggressive stroke risk factor management including hypertension with blood pressure goal below 130/90, diabetes with hemoglobin A1c goal below 7% and lipids with LDL cholesterol goal below 70 mg/dL    Overall stable from stroke standpoint. Can follow up as needed due to financial reasons as she is not insured     CC: Masneri, Wille Celeste, DO    I spent 32 minutes of face-to-face and non-face-to-face time with patient and daughter.  This included previsit chart review, lab review, study review, electronic health record documentation, patient education and discussion  regarding prior stroke, residual deficits, stroke prevention and agressive risk factor management, multiple other concerns and answered all other questions to patient and daughters satisfaction   Ihor Austin, [facility]  [facility] [address] [place], [place] [address]  Phone [phone] Fax [phone] Note: This document was prepared with digital dictation and possible smart phrase technology. Any transcriptional errors that result from this process are unintentional.

## 2021-10-08 ENCOUNTER — Ambulatory Visit (INDEPENDENT_AMBULATORY_CARE_PROVIDER_SITE_OTHER): Payer: Self-pay | Admitting: Endocrinology

## 2021-10-08 ENCOUNTER — Other Ambulatory Visit: Payer: Self-pay

## 2021-10-08 VITALS — BP 160/84 | HR 97 | Ht 66.0 in | Wt 233.2 lb

## 2021-10-08 DIAGNOSIS — E119 Type 2 diabetes mellitus without complications: Secondary | ICD-10-CM

## 2021-10-08 DIAGNOSIS — Z794 Long term (current) use of insulin: Secondary | ICD-10-CM

## 2021-10-08 LAB — POCT GLYCOSYLATED HEMOGLOBIN (HGB A1C): Hemoglobin A1C: 11.1 % — AB (ref 4.0–5.6)

## 2021-10-08 MED ORDER — FREESTYLE LIBRE 14 DAY SENSOR MISC: 3 refills | Status: DC

## 2021-10-08 MED ORDER — INSULIN NPH (HUMAN) (ISOPHANE) 100 UNIT/ML ~~LOC~~ SUSP: SUBCUTANEOUS | 3 refills | Status: DC

## 2021-10-08 NOTE — Progress Notes (Signed)
Subjective:    Patient ID: Jill Barnes, adult    DOB: Oct 17, 1961, 60 y.o.   MRN: [id number]  HPI Pt returns for f/u of diabetes mellitus: DM type: Insulin-requiring type 2 Dx'ed: 2017  Complications: CVA and PN Therapy: insulin since 2019, and glipizide GDM: 1988 DKA: never Severe hypoglycemia: never Pancreatitis: never Pancreatic imaging: normal on 2018 CT SDOH: she cannot afford health insurance, but she does not qualify for financial assist from [facility].     Other: she did not tolerate pioglitazone (edema) or metformin (weakness).   Interval history: she ran out of continuous glucose monitor sensors.  no cbg record, but states cbg varies from 185-400. There is no trend throughout the day.  pt states she never misses meds. Pt says she has no probs drawing and injecting insulin.   Past Medical History:  Diagnosis Date   COPD (chronic obstructive pulmonary disease) (HCC)    Diabetes mellitus without complication (HCC)    Kidney stone     Past Surgical History:  Procedure Laterality Date   ABDOMINAL HYSTERECTOMY     BLADDER SURGERY      Social History   Socioeconomic History   Marital status: Married    Spouse name: Not on file   Number of children: Not on file   Years of education: Not on file   Highest education level: Not on file  Occupational History   Occupation: unemployed  Tobacco Use   Smoking status: Every Day    Packs/day: 1.00    Types: Cigarettes   Smokeless tobacco: Never  Vaping Use   Vaping Use: Never used  Substance and Sexual Activity   Alcohol use: No   Drug use: No   Sexual activity: Not on file  Other Topics Concern   Not on file  Social History Narrative   Lives with husband   Right handed   Drinks 3-5 cups of caffeine daily   Social Determinants of Health   Financial Resource Strain: Not on file  Food Insecurity: Not on file  Transportation Needs: Not on file  Physical Activity: Not on file  Stress: Not on file  Social  Connections: Not on file  Intimate Partner Violence: Not on file    Current Outpatient Medications on File Prior to Visit  Medication Sig Dispense Refill   aspirin EC 81 MG tablet Take 81 mg by mouth daily. Swallow whole.     atorvastatin (LIPITOR) 20 MG tablet Take 1 tablet (20 mg total) by mouth daily. 30 tablet 0   glipiZIDE (GLUCOTROL XL) 10 MG 24 hr tablet Take 1 tablet by mouth once daily with breakfast 90 tablet 3   ibuprofen (ADVIL) 600 MG tablet Take 1 tablet (600 mg total) by mouth every 8 (eight) hours as needed. 15 tablet 0   metoCLOPramide (REGLAN) 10 MG tablet Take 1 tablet (10 mg total) by mouth every 8 (eight) hours as needed for nausea. 15 tablet 0   pantoprazole (PROTONIX) 40 MG tablet Take 1 tablet (40 mg total) by mouth daily. 30 tablet 0   lisinopril (ZESTRIL) 5 MG tablet Take 1 tablet (5 mg total) by mouth daily. 90 tablet 0   No current facility-administered medications on file prior to visit.    Allergies  Allergen Reactions   Sulfa Antibiotics Shortness Of Breath and Swelling    All-over swelling   Penicillins Rash    Family History  Problem Relation Age of Onset   Diabetes Mother    Heart failure Father  Diabetes Father     BP (!) 160/84 (BP Location: Right Arm, Patient Position: Sitting, Cuff Size: Large)   Pulse 97   Ht 5\' 6"  (1.676 m)   Wt 233 lb 3.2 oz (105.8 kg)   SpO2 96%   BMI 37.64 kg/m    Review of Systems She denies hypoglycemia.      Objective:   Physical Exam    Lab Results  Component Value Date   HGBA1C 11.1 (A) 10/08/2021   Lab Results  Component Value Date   CREATININE 0.87 08/27/2021   BUN 13 08/27/2021   NA 134 (L) 08/27/2021   K 3.7 08/27/2021   CL 102 08/27/2021   CO2 25 08/27/2021      Assessment & Plan:  Insulin-requiring type 2 DM: uncontrolled.   Patient Instructions  Your blood pressure is high today.  Please see your primary care provider soon, to have it rechecked. check your blood sugar once a  day.  vary the time of day when you check, between before the 3 meals, and at bedtime.  also check if you have symptoms of your blood sugar being too high or too low.  please keep a record of the readings and bring it to your next appointment here (or you can bring the meter itself).  You can write it on any piece of paper.  please call 10/27/2021 sooner if your blood sugar goes below 70, or if most of your readings are over 200.   We will need to take this complex situation in stages.   For now, please increase the insulin to 90 units in the morning, and 25 in the evening.  Please continue the same glipizide for now, but we'll stop this with time.   Please come back for a follow-up appointment in 2 months.

## 2021-10-08 NOTE — Patient Instructions (Addendum)
Your blood pressure is high today.  Please see your primary care provider soon, to have it rechecked. check your blood sugar once a day.  vary the time of day when you check, between before the 3 meals, and at bedtime.  also check if you have symptoms of your blood sugar being too high or too low.  please keep a record of the readings and bring it to your next appointment here (or you can bring the meter itself).  You can write it on any piece of paper.  please call us sooner if your blood sugar goes below 70, or if most of your readings are over 200.   We will need to take this complex situation in stages.   For now, please increase the insulin to 90 units in the morning, and 25 in the evening.  Please continue the same glipizide for now, but we'll stop this with time.   Please come back for a follow-up appointment in 2 months.

## 2021-12-08 ENCOUNTER — Encounter: Payer: Self-pay | Admitting: Endocrinology

## 2021-12-08 ENCOUNTER — Ambulatory Visit (INDEPENDENT_AMBULATORY_CARE_PROVIDER_SITE_OTHER): Payer: Self-pay | Admitting: Endocrinology

## 2021-12-08 ENCOUNTER — Other Ambulatory Visit: Payer: Self-pay

## 2021-12-08 VITALS — BP 130/82 | HR 89 | Ht 66.0 in | Wt 237.4 lb

## 2021-12-08 DIAGNOSIS — Z794 Long term (current) use of insulin: Secondary | ICD-10-CM

## 2021-12-08 DIAGNOSIS — E119 Type 2 diabetes mellitus without complications: Secondary | ICD-10-CM

## 2021-12-08 LAB — POCT GLYCOSYLATED HEMOGLOBIN (HGB A1C): Hemoglobin A1C: 10.1 % — AB (ref 4.0–5.6)

## 2021-12-08 MED ORDER — NOVOLIN 70/30 (70-30) 100 UNIT/ML ~~LOC~~ SUSP: SUBCUTANEOUS | 11 refills | Status: DC

## 2021-12-08 MED ORDER — GLIPIZIDE 5 MG PO TABS: 5.0000 mg | ORAL_TABLET | Freq: Every day | ORAL | 3 refills | Status: DC

## 2021-12-08 NOTE — Progress Notes (Signed)
Subjective:    Patient ID: Jill Barnes, adult    DOB: 01/12/1961, 61 y.o.   MRN: [id number]  HPI Pt returns for f/u of diabetes mellitus: DM type: Insulin-requiring type 2 Dx'ed: 2017  Complications: CVA and PN Therapy: insulin since 2019, and glipizide GDM: 1988 DKA: never Severe hypoglycemia: never Pancreatitis: never Pancreatic imaging: normal on 2018 CT SDOH: she cannot afford health insurance, but she does not qualify for financial assist from [facility].     Other: she did not tolerate pioglitazone (edema) or metformin (weakness).   Interval history: continuous glucose monitor data are reviewed.  glucose varies from 75-340. It is in general highest at 1-2 AM.  It then decreases to its lowest point (7AM).  It then increases bac to 1AM.  pt states she never misses meds. Pt says she has no probs drawing and injecting insulin. Pt says her insulin is 70/30.  Past Medical History:  Diagnosis Date   COPD (chronic obstructive pulmonary disease) (HCC)    Diabetes mellitus without complication (HCC)    Kidney stone     Past Surgical History:  Procedure Laterality Date   ABDOMINAL HYSTERECTOMY     BLADDER SURGERY      Social History   Socioeconomic History   Marital status: Married    Spouse name: Not on file   Number of children: Not on file   Years of education: Not on file   Highest education level: Not on file  Occupational History   Occupation: unemployed  Tobacco Use   Smoking status: Every Day    Packs/day: 1.00    Types: Cigarettes   Smokeless tobacco: Never  Vaping Use   Vaping Use: Never used  Substance and Sexual Activity   Alcohol use: No   Drug use: No   Sexual activity: Not on file  Other Topics Concern   Not on file  Social History Narrative   Lives with husband   Right handed   Drinks 3-5 cups of caffeine daily   Social Determinants of Health   Financial Resource Strain: Not on file  Food Insecurity: Not on file  Transportation Needs: Not on  file  Physical Activity: Not on file  Stress: Not on file  Social Connections: Not on file  Intimate Partner Violence: Not on file    Current Outpatient Medications on File Prior to Visit  Medication Sig Dispense Refill   aspirin EC 81 MG tablet Take 81 mg by mouth daily. Swallow whole.     atorvastatin (LIPITOR) 20 MG tablet Take 1 tablet (20 mg total) by mouth daily. 30 tablet 0   Continuous Blood Gluc Sensor (FREESTYLE LIBRE 14 DAY SENSOR) MISC Change every 14 days 6 each 3   ibuprofen (ADVIL) 600 MG tablet Take 1 tablet (600 mg total) by mouth every 8 (eight) hours as needed. 15 tablet 0   insulin NPH Human (NOVOLIN N) 100 UNIT/ML injection 90 units each morning, and 25 units in the evening. 60 mL 3   metoCLOPramide (REGLAN) 10 MG tablet Take 1 tablet (10 mg total) by mouth every 8 (eight) hours as needed for nausea. 15 tablet 0   pantoprazole (PROTONIX) 40 MG tablet Take 1 tablet (40 mg total) by mouth daily. 30 tablet 0   lisinopril (ZESTRIL) 5 MG tablet Take 1 tablet (5 mg total) by mouth daily. 90 tablet 0   No current facility-administered medications on file prior to visit.    Allergies  Allergen Reactions   Sulfa Antibiotics  Shortness Of Breath and Swelling    All-over swelling   Penicillins Rash    Family History  Problem Relation Age of Onset   Diabetes Mother    Heart failure Father    Diabetes Father     BP 130/82    Pulse 89    Ht 5\' 6"  (1.676 m)    Wt 237 lb 6.4 oz (107.7 kg)    SpO2 98%    BMI 38.32 kg/m    Review of Systems     Objective:   Physical Exam    Lab Results  Component Value Date   HGBA1C 10.1 (A) 12/08/2021   Lab Results  Component Value Date   CREATININE 0.87 08/27/2021   BUN 13 08/27/2021   NA 134 (L) 08/27/2021   K 3.7 08/27/2021   CL 102 08/27/2021   CO2 25 08/27/2021      Assessment & Plan:  Insulin-requiring type 2 DM: uncontrolled  Patient Instructions  I have sent a prescription to your pharmacy, to reduce the  glipizide to 5 mg with breakfast, and: Please increase the insulin to 110 units with breakfast, and 35 with the evening meal. Please come back for a follow-up appointment in 2 months.

## 2021-12-08 NOTE — Patient Instructions (Signed)
I have sent a prescription to your pharmacy, to reduce the glipizide to 5 mg with breakfast, and: Please increase the insulin to 110 units with breakfast, and 35 with the evening meal. Please come back for a follow-up appointment in 2 months.

## 2022-01-21 DIAGNOSIS — Z0271 Encounter for disability determination: Secondary | ICD-10-CM

## 2022-02-09 ENCOUNTER — Encounter: Payer: Self-pay | Admitting: Endocrinology

## 2022-02-09 ENCOUNTER — Other Ambulatory Visit: Payer: Self-pay

## 2022-02-09 ENCOUNTER — Ambulatory Visit (INDEPENDENT_AMBULATORY_CARE_PROVIDER_SITE_OTHER): Payer: Self-pay | Admitting: Endocrinology

## 2022-02-09 VITALS — BP 138/84 | HR 93 | Ht 66.0 in | Wt 235.6 lb

## 2022-02-09 DIAGNOSIS — E119 Type 2 diabetes mellitus without complications: Secondary | ICD-10-CM

## 2022-02-09 DIAGNOSIS — Z794 Long term (current) use of insulin: Secondary | ICD-10-CM

## 2022-02-09 LAB — POCT GLYCOSYLATED HEMOGLOBIN (HGB A1C): Hemoglobin A1C: 9.7 % — AB (ref 4.0–5.6)

## 2022-02-09 MED ORDER — INSULIN NPH (HUMAN) (ISOPHANE) 100 UNIT/ML ~~LOC~~ SUSP: SUBCUTANEOUS | 3 refills | Status: DC

## 2022-02-09 NOTE — Patient Instructions (Addendum)
Please stop taking the glipizide, and:  ?Please increase the insulin to 130 units each morning, and 35 units in the evening.   ?Please come back for a follow-up appointment in 2 months.  ?

## 2022-02-09 NOTE — Progress Notes (Signed)
? ?Subjective:  ? ? Patient ID: Jill Barnes, adult    DOB: 1961/11/11, 61 y.o.   MRN: [id number] ? ?HPI ?Pt returns for f/u of diabetes mellitus: ?DM type: Insulin-requiring type 2 ?Dx'ed: 2017  ?Complications: CVA and PN.  ?Therapy: insulin since 2019, and glipizide.   ?GDM: 1988 ?DKA: never ?Severe hypoglycemia: never ?Pancreatitis: never ?Pancreatic imaging: normal on 2018 CT ?SDOH: she cannot afford health insurance, but she does not qualify for financial assist from [facility].    ?Other: she did not tolerate pioglitazone (edema) or metformin (weakness).   ?Interval history: continuous glucose monitor data are reviewed (01/11/22-01/24/22).  glucose varies from 70-330. It is in general highest at , and lowest at 7AM.  It decreases overnight, and increases throughout the day.  pt states she never misses meds. Pt says she has no probs drawing and injecting insulin. Pt says her insulin is NPH, 110 units qam and 35 units QPM.   ?Past Medical History:  ?Diagnosis Date  ? COPD (chronic obstructive pulmonary disease) (HCC)   ? Diabetes mellitus without complication (HCC)   ? Kidney stone   ? ? ?Past Surgical History:  ?Procedure Laterality Date  ? ABDOMINAL HYSTERECTOMY    ? BLADDER SURGERY    ? ? ?Social History  ? ?Socioeconomic History  ? Marital status: Married  ?  Spouse name: Not on file  ? Number of children: Not on file  ? Years of education: Not on file  ? Highest education level: Not on file  ?Occupational History  ? Occupation: unemployed  ?Tobacco Use  ? Smoking status: Every Day  ?  Packs/day: 1.00  ?  Types: Cigarettes  ? Smokeless tobacco: Never  ?Vaping Use  ? Vaping Use: Never used  ?Substance and Sexual Activity  ? Alcohol use: No  ? Drug use: No  ? Sexual activity: Not on file  ?Other Topics Concern  ? Not on file  ?Social History Narrative  ? Lives with husband  ? Right handed  ? Drinks 3-5 cups of caffeine daily  ? ?Social Determinants of Health  ? ?Financial Resource Strain: Not on file  ?Food  Insecurity: Not on file  ?Transportation Needs: Not on file  ?Physical Activity: Not on file  ?Stress: Not on file  ?Social Connections: Not on file  ?Intimate Partner Violence: Not on file  ? ? ?Current Outpatient Medications on File Prior to Visit  ?Medication Sig Dispense Refill  ? aspirin EC 81 MG tablet Take 81 mg by mouth daily. Swallow whole.    ? atorvastatin (LIPITOR) 20 MG tablet Take 1 tablet (20 mg total) by mouth daily. 30 tablet 0  ? Continuous Blood Gluc Sensor (FREESTYLE LIBRE 14 DAY SENSOR) MISC Change every 14 days 6 each 3  ? ibuprofen (ADVIL) 600 MG tablet Take 1 tablet (600 mg total) by mouth every 8 (eight) hours as needed. 15 tablet 0  ? metoCLOPramide (REGLAN) 10 MG tablet Take 1 tablet (10 mg total) by mouth every 8 (eight) hours as needed for nausea. 15 tablet 0  ? pantoprazole (PROTONIX) 40 MG tablet Take 1 tablet (40 mg total) by mouth daily. 30 tablet 0  ? lisinopril (ZESTRIL) 5 MG tablet Take 1 tablet (5 mg total) by mouth daily. 90 tablet 0  ? ?No current facility-administered medications on file prior to visit.  ? ? ?Allergies  ?Allergen Reactions  ? Sulfa Antibiotics Shortness Of Breath and Swelling  ?  All-over swelling  ? Penicillins Rash  ? ? ?  Family History  ?Problem Relation Age of Onset  ? Diabetes Mother   ? Heart failure Father   ? Diabetes Father   ? ? ?BP 138/84 (BP Location: Left Arm, Patient Position: Sitting, Cuff Size: Normal)   Pulse 93   Ht 5\' 6"  (1.676 m)   Wt 235 lb 9.6 oz (106.9 kg)   SpO2 97%   BMI 38.03 kg/m?  ? ? ?Review of Systems ? ?   ?Objective:  ? Physical Exam ?VITAL SIGNS:  See vs page.   ?GENERAL: no distress.   ? ? ?Lab Results  ?Component Value Date  ? HGBA1C 9.7 (A) 02/09/2022  ? ?Lab Results  ?Component Value Date  ? CREATININE 0.87 08/27/2021  ? BUN 13 08/27/2021  ? NA 134 (L) 08/27/2021  ? K 3.7 08/27/2021  ? CL 102 08/27/2021  ? CO2 25 08/27/2021  ? ?   ?Assessment & Plan:  ?Insulin-requiring type 2 DM: uncontrolled ? ?Patient Instructions   ?Please stop taking the glipizide, and:  ?Please increase the insulin to 130 units each morning, and 35 units in the evening.   ?Please come back for a follow-up appointment in 2 months.  ? ? ?

## 2022-02-22 ENCOUNTER — Telehealth: Payer: Self-pay

## 2022-02-22 NOTE — Telephone Encounter (Signed)
Patient says that she is having low sugars every morning ranging between 40 -60, around 4:00 a.m.- 8:00 a.m. when she wakes up. ? ?She has taken her insulin spacing out her units throughout the day 100 units in the morning between 5 & 8 a.m. , around 11:00 p.m she does another  35 units. And another 35 units around 10 p.m. Informed the patient that her morning dose is prescribed as a total of 130 units whereas she is taking 135 instead. Please advise ?

## 2022-02-22 NOTE — Telephone Encounter (Signed)
Patient has now been informed and expressed understanding.  

## 2022-03-23 ENCOUNTER — Emergency Department (HOSPITAL_BASED_OUTPATIENT_CLINIC_OR_DEPARTMENT_OTHER): Payer: Self-pay

## 2022-03-23 ENCOUNTER — Encounter (HOSPITAL_BASED_OUTPATIENT_CLINIC_OR_DEPARTMENT_OTHER): Payer: Self-pay | Admitting: Urology

## 2022-03-23 ENCOUNTER — Emergency Department (HOSPITAL_BASED_OUTPATIENT_CLINIC_OR_DEPARTMENT_OTHER)
Admission: EM | Admit: 2022-03-23 | Discharge: 2022-03-23 | Disposition: A | Discharge status: Home / Self Care | Payer: Self-pay | Attending: Emergency Medicine | Admitting: Emergency Medicine

## 2022-03-23 DIAGNOSIS — Z794 Long term (current) use of insulin: Secondary | ICD-10-CM | POA: Insufficient documentation

## 2022-03-23 DIAGNOSIS — I110 Hypertensive heart disease with heart failure: Secondary | ICD-10-CM | POA: Insufficient documentation

## 2022-03-23 DIAGNOSIS — Z7982 Long term (current) use of aspirin: Secondary | ICD-10-CM | POA: Insufficient documentation

## 2022-03-23 DIAGNOSIS — I509 Heart failure, unspecified: Secondary | ICD-10-CM | POA: Insufficient documentation

## 2022-03-23 DIAGNOSIS — E119 Type 2 diabetes mellitus without complications: Secondary | ICD-10-CM | POA: Insufficient documentation

## 2022-03-23 DIAGNOSIS — K859 Acute pancreatitis without necrosis or infection, unspecified: Secondary | ICD-10-CM | POA: Insufficient documentation

## 2022-03-23 DIAGNOSIS — J449 Chronic obstructive pulmonary disease, unspecified: Secondary | ICD-10-CM | POA: Insufficient documentation

## 2022-03-23 DIAGNOSIS — Z79899 Other long term (current) drug therapy: Secondary | ICD-10-CM | POA: Insufficient documentation

## 2022-03-23 LAB — COMPREHENSIVE METABOLIC PANEL
ALT: 23 U/L (ref 0–44)
AST: 30 U/L (ref 15–41)
Albumin: 3.3 g/dL — ABNORMAL LOW (ref 3.5–5.0)
Alkaline Phosphatase: 107 U/L (ref 38–126)
Anion gap: 9 (ref 5–15)
BUN: 9 mg/dL (ref 6–20)
CO2: 23 mmol/L (ref 22–32)
Calcium: 8.7 mg/dL — ABNORMAL LOW (ref 8.9–10.3)
Chloride: 103 mmol/L (ref 98–111)
Creatinine, Ser: 0.8 mg/dL (ref 0.44–1.00)
GFR, Estimated: >60 mL/min (ref >60)
Glucose, Bld: 324 mg/dL — ABNORMAL HIGH (ref 70–99)
Potassium: 3.9 mmol/L (ref 3.5–5.1)
Sodium: 135 mmol/L (ref 135–145)
Total Bilirubin: 0.5 mg/dL (ref 0.3–1.2)
Total Protein: 7.9 g/dL (ref 6.5–8.1)

## 2022-03-23 LAB — CBC WITH DIFFERENTIAL/PLATELET
Abs Immature Granulocytes: 0.05 10*3/uL (ref 0.00–0.07)
Basophils Absolute: 0 10*3/uL (ref 0.0–0.1)
Basophils Relative: 0 %
Eosinophils Absolute: 0.2 10*3/uL (ref 0.0–0.5)
Eosinophils Relative: 2 %
HCT: 45 % (ref 36.0–46.0)
Hemoglobin: 15.4 g/dL — ABNORMAL HIGH (ref 12.0–15.0)
Immature Granulocytes: 1 %
Lymphocytes Relative: 21 %
Lymphs Abs: 2.3 10*3/uL (ref 0.7–4.0)
MCH: 31.1 pg (ref 26.0–34.0)
MCHC: 34.2 g/dL (ref 30.0–36.0)
MCV: 90.9 fL (ref 80.0–100.0)
Monocytes Absolute: 0.7 10*3/uL (ref 0.1–1.0)
Monocytes Relative: 6 %
Neutro Abs: 7.6 10*3/uL (ref 1.7–7.7)
Neutrophils Relative %: 70 %
Platelets: 270 10*3/uL (ref 150–400)
RBC: 4.95 MIL/uL (ref 3.87–5.11)
RDW: 13.2 % (ref 11.5–15.5)
WBC: 10.9 10*3/uL — ABNORMAL HIGH (ref 4.0–10.5)
nRBC: 0 % (ref 0.0–0.2)

## 2022-03-23 LAB — LIPASE, BLOOD: Lipase: 918 U/L — ABNORMAL HIGH (ref 11–51)

## 2022-03-23 LAB — URINALYSIS, ROUTINE W REFLEX MICROSCOPIC
Bilirubin Urine: NEGATIVE
Glucose, UA: >=500 mg/dL — AB
Hgb urine dipstick: NEGATIVE
Ketones, ur: NEGATIVE mg/dL
Leukocytes,Ua: NEGATIVE
Nitrite: NEGATIVE
Protein, ur: NEGATIVE mg/dL
Specific Gravity, Urine: 1.025 (ref 1.005–1.030)
pH: 5.5 (ref 5.0–8.0)

## 2022-03-23 LAB — I-STAT VENOUS BLOOD GAS, ED
Acid-base deficit: 1 mmol/L (ref 0.0–2.0)
Bicarbonate: 23.5 mmol/L (ref 20.0–28.0)
Calcium, Ion: 1.07 mmol/L — ABNORMAL LOW (ref 1.15–1.40)
HCT: 45 % (ref 36.0–46.0)
Hemoglobin: 15.3 g/dL — ABNORMAL HIGH (ref 12.0–15.0)
O2 Saturation: 84 %
Patient temperature: 98
Potassium: 3.9 mmol/L (ref 3.5–5.1)
Sodium: 137 mmol/L (ref 135–145)
TCO2: 25 mmol/L (ref 22–32)
pCO2, Ven: 38.3 mmHg — ABNORMAL LOW (ref 44–60)
pH, Ven: 7.394 (ref 7.25–7.43)
pO2, Ven: 48 mmHg — ABNORMAL HIGH (ref 32–45)

## 2022-03-23 LAB — URINALYSIS, MICROSCOPIC (REFLEX)

## 2022-03-23 LAB — CBG MONITORING, ED
Glucose-Capillary: 366 mg/dL — ABNORMAL HIGH (ref 70–99)
Glucose-Capillary: 204 mg/dL — ABNORMAL HIGH (ref 70–99)

## 2022-03-23 MED ORDER — INSULIN ASPART 100 UNIT/ML IJ SOLN
8.0000 [IU] | Freq: Once | INTRAMUSCULAR | Status: AC
  Administered 2022-03-23: 8 [IU] via SUBCUTANEOUS

## 2022-03-23 MED ORDER — ONDANSETRON HCL 4 MG/2ML IJ SOLN
4.0000 mg | Freq: Once | INTRAMUSCULAR | Status: AC
  Administered 2022-03-23: 4 mg via INTRAVENOUS
  Filled 2022-03-23: qty 2

## 2022-03-23 MED ORDER — SODIUM CHLORIDE 0.9 % IV BOLUS
1000.0000 mL | Freq: Once | INTRAVENOUS | Status: AC
  Administered 2022-03-23: 1000 mL via INTRAVENOUS

## 2022-03-23 MED ORDER — IOHEXOL 300 MG/ML  SOLN
100.0000 mL | Freq: Once | INTRAMUSCULAR | Status: AC | PRN
  Administered 2022-03-23: 100 mL via INTRAVENOUS

## 2022-03-23 MED ORDER — MORPHINE SULFATE (PF) 4 MG/ML IV SOLN
4.0000 mg | Freq: Once | INTRAVENOUS | Status: AC
Start: 2022-03-23 — End: 2022-03-23
  Administered 2022-03-23: 4 mg via INTRAVENOUS
  Filled 2022-03-23: qty 1

## 2022-03-23 MED ORDER — ONDANSETRON 4 MG PO TBDP: 4.0000 mg | ORAL_TABLET | Freq: Three times a day (TID) | ORAL | 0 refills | Status: DC | PRN

## 2022-03-23 MED ORDER — OXYCODONE-ACETAMINOPHEN 5-325 MG PO TABS
1.0000 | ORAL_TABLET | Freq: Once | ORAL | Status: DC
  Filled 2022-03-23: qty 1

## 2022-03-23 MED ORDER — OXYCODONE-ACETAMINOPHEN 5-325 MG PO TABS: 1.0000 | ORAL_TABLET | Freq: Four times a day (QID) | ORAL | 0 refills | Status: DC | PRN

## 2022-03-23 NOTE — ED Provider Notes (Signed)
?[facility] EMERGENCY DEPARTMENT ?Provider Note ? ? ?CSN: [id number] ?Arrival date & time: 03/23/22  1525 ? ?  ? ?History ? ?Chief Complaint  ?Patient presents with  ? Abdominal Pain  ? ? ?Jill Barnes is a 61 y.o. adult with PMHx HTN< HLD, Diabetes, CHF, CVA, COPD, who presents to the ED today with complaint of gradual onset, constant, sharp, RUQ abdominal pain that began 2 days ago. Pt also complains of pain radiating into back, nausea, and dry heaving. She mentions that her sugars have been running high the past 2 days as well. She last took 100 units of insulin prior to arrival. She tells me she is supposed to be on 160 units however only had 100 units left. Per chart review telephone conversation with endocrinology on 04/03 - pt prescribed 130 units each morning and 35 units in the evening. Pt denies any recent sick contacts. Denies any suspicious food intake. Denies fevers, chills, vomiting, diarrhea, urinary symptoms. PSHx includes hysterectomy and bladder tack surgery.  ? ?The history is provided by the patient, medical records and a relative.  ? ?  ? ?Home Medications ?Prior to Admission medications   ?Medication Sig Start Date End Date Taking? Authorizing Provider  ?ondansetron (ZOFRAN-ODT) 4 MG disintegrating tablet Take 1 tablet (4 mg total) by mouth every 8 (eight) hours as needed for nausea or vomiting. 03/23/22  Yes Trenda Corliss, PA-C  ?oxyCODONE-acetaminophen (PERCOCET/ROXICET) 5-325 MG tablet Take 1 tablet by mouth every 6 (six) hours as needed for severe pain. 03/23/22  Yes Tamilyn Lupien, PA-C  ?aspirin EC 81 MG tablet Take 81 mg by mouth daily. Swallow whole.    [provider]  ?atorvastatin (LIPITOR) 20 MG tablet Take 1 tablet (20 mg total) by mouth daily. 07/02/20   Elgergawy, Leana Roe, MD  ?Continuous Blood Gluc Sensor (FREESTYLE LIBRE 14 DAY SENSOR) MISC Change every 14 days 10/08/21   Romero Belling, MD  ?ibuprofen (ADVIL) 600 MG tablet Take 1 tablet (600 mg total) by  mouth every 8 (eight) hours as needed. 08/27/21   Caccavale, Sophia, PA-C  ?insulin NPH Human (NOVOLIN N) 100 UNIT/ML injection 130 units each morning, and 35 units in the evening. 02/09/22   Romero Belling, MD  ?lisinopril (ZESTRIL) 5 MG tablet Take 1 tablet (5 mg total) by mouth daily. 10/13/20 09/24/21  Lorin Glass, MD  ?metoCLOPramide (REGLAN) 10 MG tablet Take 1 tablet (10 mg total) by mouth every 8 (eight) hours as needed for nausea. 08/27/21   Caccavale, Sophia, PA-C  ?pantoprazole (PROTONIX) 40 MG tablet Take 1 tablet (40 mg total) by mouth daily. 07/03/20   Elgergawy, Leana Roe, MD  ?   ? ?Allergies    ?Sulfa antibiotics and Penicillins   ? ?Review of Systems   ?Review of Systems  ?Constitutional:  Negative for chills and fever.  ?Respiratory:  Negative for shortness of breath.   ?Cardiovascular:  Negative for chest pain.  ?Gastrointestinal:  Positive for abdominal pain and nausea. Negative for constipation, diarrhea and vomiting.  ?Genitourinary:  Negative for dysuria and frequency.  ?Musculoskeletal:  Positive for back pain.  ?All other systems reviewed and are negative. ? ?Physical Exam ?Updated Vital Signs ?BP 139/88 (BP Location: Left Arm)   Pulse 88   Temp 97.6 ?F (36.4 ?C) (Oral)   Resp 20   Ht 5\' 6"  (1.676 m)   Wt 111.1 kg   SpO2 95%   BMI 39.54 kg/m?  ? ?Physical Exam ?Vitals and nursing note reviewed.  ?Constitutional:   ?  Appearance: She is not ill-appearing.  ?HENT:  ?   Head: Normocephalic and atraumatic.  ?Eyes:  ?   Conjunctiva/sclera: Conjunctivae normal.  ?Cardiovascular:  ?   Rate and Rhythm: Normal rate and regular rhythm.  ?   Pulses: Normal pulses.  ?Pulmonary:  ?   Effort: Pulmonary effort is normal.  ?   Breath sounds: Normal breath sounds. No wheezing, rhonchi or rales.  ?Abdominal:  ?   Palpations: Abdomen is soft.  ?   Tenderness: There is abdominal tenderness. There is no guarding or rebound.  ?   Comments: Soft, + RUQ TTP with positive Murphy's sign, +BS throughout, no r/g/r,  neg mcburney's  ?Musculoskeletal:  ?   Cervical back: Neck supple.  ?Skin: ?   General: Skin is warm and dry.  ?Neurological:  ?   Mental Status: She is alert.  ? ? ?ED Results / Procedures / Treatments   ?Labs ?(all labs ordered are listed, but only abnormal results are displayed) ?Labs Reviewed  ?CBC WITH DIFFERENTIAL/PLATELET - Abnormal; Notable for the following components:  ?    Result Value  ? WBC 10.9 (*)   ? Hemoglobin 15.4 (*)   ? All other components within normal limits  ?LIPASE, BLOOD - Abnormal; Notable for the following components:  ? Lipase 918 (*)   ? All other components within normal limits  ?COMPREHENSIVE METABOLIC PANEL - Abnormal; Notable for the following components:  ? Glucose, Bld 324 (*)   ? Calcium 8.7 (*)   ? Albumin 3.3 (*)   ? All other components within normal limits  ?URINALYSIS, ROUTINE W REFLEX MICROSCOPIC - Abnormal; Notable for the following components:  ? Glucose, UA >=500 (*)   ? All other components within normal limits  ?URINALYSIS, MICROSCOPIC (REFLEX) - Abnormal; Notable for the following components:  ? Bacteria, UA FEW (*)   ? All other components within normal limits  ?CBG MONITORING, ED - Abnormal; Notable for the following components:  ? Glucose-Capillary 366 (*)   ? All other components within normal limits  ?I-STAT VENOUS BLOOD GAS, ED - Abnormal; Notable for the following components:  ? pCO2, Ven 38.3 (*)   ? pO2, Ven 48 (*)   ? Calcium, Ion 1.07 (*)   ? Hemoglobin 15.3 (*)   ? All other components within normal limits  ?CBG MONITORING, ED - Abnormal; Notable for the following components:  ? Glucose-Capillary 204 (*)   ? All other components within normal limits  ? ? ?EKG ?None ? ?Radiology ?CT Abdomen Pelvis W Contrast ? ?Result Date: 03/23/2022 ?CLINICAL DATA:  Right upper quadrant pain. Elevated lipase. Concern for choledocholithiasis. EXAM: CT ABDOMEN AND PELVIS WITH CONTRAST TECHNIQUE: Multidetector CT imaging of the abdomen and pelvis was performed using the  standard protocol following bolus administration of intravenous contrast. RADIATION DOSE REDUCTION: This exam was performed according to the departmental dose-optimization program which includes automated exposure control, adjustment of the mA and/or kV according to patient size and/or use of iterative reconstruction technique. CONTRAST:  OMNIPAQUE IOHEXOL 300 MG/ML  SOLN COMPARISON:  Right upper quadrant abdominal ultrasound 03/23/2022. CT abdomen and pelvis 02/25/2017. FINDINGS: Lower chest: Clear lung bases. Hepatobiliary: Hepatic steatosis. Unremarkable gallbladder. No biliary dilatation or visible common bile duct stone. Pancreas: Unremarkable. Spleen: Unremarkable. Adrenals/Urinary Tract: Unremarkable adrenal glands. No suspicious renal mass, calculi, or hydronephrosis. Unremarkable bladder. Stomach/Bowel: The stomach is unremarkable. There is no evidence of bowel obstruction or inflammation. The appendix is not clearly identified, however no inflammatory changes are  seen in the right upper quadrant. Vascular/Lymphatic: Abdominal aortic atherosclerosis without aneurysm. No enlarged lymph nodes. Reproductive: Hysterectomy. Similar overall size of a cystic pelvic mass compared to the prior CT, currently measuring 12.5 x 8.1 cm (previously 11.8 x 8.8 cm). Other: No ascites or pneumoperitoneum. Mild nonspecific subcutaneous fat stranding in the anterior abdominal wall on the left, query subcutaneous medication administration. Musculoskeletal: No acute osseous abnormality or suspicious osseous lesion. IMPRESSION: 1. No acute abnormality identified in the abdomen or pelvis. 2. Hepatic steatosis. 3. 12 cm cystic pelvic mass. No significant enlargement since 2018 when it was also evaluated by ultrasound with recommendation made for gynecology referral at that time. 4. Aortic Atherosclerosis (ICD10-I70.0). Electronically Signed   By: Sebastian AcheAllen  Grady M.D.   On: 03/23/2022 18:34  ? ?US Abdomen Limited RUQ  (LIVER/GB) ? ?Result Date: 03/23/2022 ?CLINICAL DATA:  Right upper quadrant pain. EXAM: ULTRASOUND ABDOMEN LIMITED RIGHT UPPER QUADRANT COMPARISON:  CT abdomen and pelvis 02/26/2017 FINDINGS: Gallbladder: No gallstones or wall th

## 2022-03-23 NOTE — ED Notes (Signed)
Checked CBG 204, RN Ranette informed ?

## 2022-03-23 NOTE — ED Notes (Signed)
Dc instructions and scripts reviewed with pt no questions or concerns at this time. Pt wheeled out to vehicle and transported home by daughter.  ?

## 2022-03-23 NOTE — Discharge Instructions (Addendum)
We are having issues sending electronic prescriptions at this time. Please take your printed prescriptions to pharmacy of your choosing to have them filled.  ? ?Drink plenty of fluids to stay hydrated and take pain medication as needed.  ? ?Follow up with your PCP for further evaluation.  ? ?Return to the ED IMMEDIATELY for any new/worsening symptoms.  ?

## 2022-03-23 NOTE — ED Triage Notes (Signed)
RUQ pain x 2 days ?Nausea noted but no vomiting  ?No diarrhea  ?Denies fever  ? ?H/o DM, BS 375 on way here  ?

## 2022-03-23 NOTE — ED Notes (Signed)
US at bedside

## 2022-03-29 ENCOUNTER — Ambulatory Visit (INDEPENDENT_AMBULATORY_CARE_PROVIDER_SITE_OTHER): Payer: Self-pay | Admitting: Endocrinology

## 2022-03-29 ENCOUNTER — Encounter: Payer: Self-pay | Admitting: Endocrinology

## 2022-03-29 VITALS — BP 138/82 | Ht 66.0 in | Wt 233.0 lb

## 2022-03-29 DIAGNOSIS — Z794 Long term (current) use of insulin: Secondary | ICD-10-CM

## 2022-03-29 DIAGNOSIS — E119 Type 2 diabetes mellitus without complications: Secondary | ICD-10-CM

## 2022-03-29 LAB — POCT GLYCOSYLATED HEMOGLOBIN (HGB A1C): Hemoglobin A1C: 10 % — AB (ref 4.0–5.6)

## 2022-03-29 MED ORDER — FREESTYLE LIBRE 14 DAY SENSOR MISC: 3 refills | Status: DC

## 2022-03-29 MED ORDER — INSULIN NPH (HUMAN) (ISOPHANE) 100 UNIT/ML ~~LOC~~ SUSP: 180.0000 [IU] | SUBCUTANEOUS | 1 refills | Status: DC

## 2022-03-29 NOTE — Progress Notes (Signed)
? ?Subjective:  ? ? Patient ID: Jill Barnes, adult    DOB: 1961-10-23, 61 y.o.   MRN: [id number] ? ?HPI ?Pt returns for f/u of diabetes mellitus:  ?DM type: Insulin-requiring type 2 ?Dx'ed: 2017  ?Complications: CVA and PN.  ?Therapy: insulin since 2019.   ?GDM: 1988 ?DKA: never ?Severe hypoglycemia: never ?Pancreatitis: once (2023).   ?Pancreatic imaging: normal on 2023 CT ?SDOH: she cannot afford health insurance, but she also does not qualify for financial assist from [facility].    ?Other: she did not tolerate pioglitazone (edema) or metformin (weakness); Pt says she has no probs drawing and injecting insulin.    ?Interval history: continuous glucose monitor data are reviewed.  glucose varies from 130-400. It is in general highest at 11PM, and lowest at 4AM.  It decreases overnight, and increases throughout the day.  pt states she never misses meds.  She takes 160 units qam, and none in the evening.  She was seen in ER last week with pancreatitis.  Nausea is much better.   ?Past Medical History:  ?Diagnosis Date  ? COPD (chronic obstructive pulmonary disease) (HCC)   ? Diabetes mellitus without complication (HCC)   ? Kidney stone   ? ? ?Past Surgical History:  ?Procedure Laterality Date  ? ABDOMINAL HYSTERECTOMY    ? BLADDER SURGERY    ? ? ?Social History  ? ?Socioeconomic History  ? Marital status: Married  ?  Spouse name: Not on file  ? Number of children: Not on file  ? Years of education: Not on file  ? Highest education level: Not on file  ?Occupational History  ? Occupation: unemployed  ?Tobacco Use  ? Smoking status: Every Day  ?  Packs/day: 1.00  ?  Types: Cigarettes  ? Smokeless tobacco: Never  ?Vaping Use  ? Vaping Use: Never used  ?Substance and Sexual Activity  ? Alcohol use: No  ? Drug use: No  ? Sexual activity: Not on file  ?Other Topics Concern  ? Not on file  ?Social History Narrative  ? Lives with husband  ? Right handed  ? Drinks 3-5 cups of caffeine daily  ? ?Social Determinants of Health   ? ?Financial Resource Strain: Not on file  ?Food Insecurity: Not on file  ?Transportation Needs: Not on file  ?Physical Activity: Not on file  ?Stress: Not on file  ?Social Connections: Not on file  ?Intimate Partner Violence: Not on file  ? ? ?Current Outpatient Medications on File Prior to Visit  ?Medication Sig Dispense Refill  ? aspirin EC 81 MG tablet Take 81 mg by mouth daily. Swallow whole.    ? atorvastatin (LIPITOR) 20 MG tablet Take 1 tablet (20 mg total) by mouth daily. 30 tablet 0  ? ibuprofen (ADVIL) 600 MG tablet Take 1 tablet (600 mg total) by mouth every 8 (eight) hours as needed. 15 tablet 0  ? metoCLOPramide (REGLAN) 10 MG tablet Take 1 tablet (10 mg total) by mouth every 8 (eight) hours as needed for nausea. 15 tablet 0  ? ondansetron (ZOFRAN-ODT) 4 MG disintegrating tablet Take 1 tablet (4 mg total) by mouth every 8 (eight) hours as needed for nausea or vomiting. 20 tablet 0  ? oxyCODONE-acetaminophen (PERCOCET/ROXICET) 5-325 MG tablet Take 1 tablet by mouth every 6 (six) hours as needed for severe pain. 15 tablet 0  ? pantoprazole (PROTONIX) 40 MG tablet Take 1 tablet (40 mg total) by mouth daily. 30 tablet 0  ? lisinopril (ZESTRIL) 5 MG tablet  Take 1 tablet (5 mg total) by mouth daily. 90 tablet 0  ? ?No current facility-administered medications on file prior to visit.  ? ? ?Allergies  ?Allergen Reactions  ? Sulfa Antibiotics Shortness Of Breath and Swelling  ?  All-over swelling  ? Penicillins Rash  ? ? ?Family History  ?Problem Relation Age of Onset  ? Diabetes Mother   ? Heart failure Father   ? Diabetes Father   ? ? ?BP 138/82   Ht 5\' 6"  (1.676 m)   Wt 233 lb (105.7 kg)   BMI 37.61 kg/m?  ? ? ?Review of Systems ?She denies hypoglycemia ?   ?Objective:  ? Physical Exam ?VITAL SIGNS:  See vs page ?GENERAL: no distress ? ? ? ?A1c=10.0% ?   ?Assessment & Plan:  ?Insulin-requiring type 2 DM: uncontrolled ? ?Patient Instructions  ?Please increase the insulin to 180 units each morning, and  none in the evening.   ?Please call or message in a few days, to tell us how the blood sugar is doing.  ?You should have an endocrinology follow-up appointment in 2 months.   ? ? ?

## 2022-03-29 NOTE — Patient Instructions (Addendum)
Please increase the insulin to 180 units each morning, and none in the evening.   ?Please call or message [place] in a few days, to tell us how the blood sugar is doing.  ?You should have an endocrinology follow-up appointment in 2 months.   ?

## 2022-06-07 ENCOUNTER — Ambulatory Visit: Payer: Self-pay | Admitting: Internal Medicine

## 2022-08-05 ENCOUNTER — Ambulatory Visit: Payer: Self-pay | Admitting: Podiatry

## 2022-10-21 ENCOUNTER — Ambulatory Visit: Payer: Self-pay | Admitting: Internal Medicine

## 2022-10-22 ENCOUNTER — Encounter (HOSPITAL_BASED_OUTPATIENT_CLINIC_OR_DEPARTMENT_OTHER): Payer: Self-pay | Admitting: Emergency Medicine

## 2022-10-22 ENCOUNTER — Emergency Department (HOSPITAL_BASED_OUTPATIENT_CLINIC_OR_DEPARTMENT_OTHER): Payer: Self-pay

## 2022-10-22 ENCOUNTER — Emergency Department (HOSPITAL_BASED_OUTPATIENT_CLINIC_OR_DEPARTMENT_OTHER)
Admission: EM | Admit: 2022-10-22 | Discharge: 2022-10-22 | Disposition: A | Discharge status: Home / Self Care | Payer: Self-pay | Attending: Emergency Medicine | Admitting: Emergency Medicine

## 2022-10-22 ENCOUNTER — Other Ambulatory Visit: Payer: Self-pay

## 2022-10-22 DIAGNOSIS — M79604 Pain in right leg: Secondary | ICD-10-CM | POA: Insufficient documentation

## 2022-10-22 NOTE — ED Triage Notes (Signed)
Right leg pain nad tingling , swelling per pt . Here for DVT study . Hx diabetes

## 2022-10-22 NOTE — Discharge Instructions (Addendum)
I have given you the information for a vascular doctor.  Please follow-up with them to have your legs evaluated.  Continue to follow-up with your primary care and endocrinologist.  Return to the ER for any new or concerning symptoms.  Your DVT ultrasound was negative for a blood clot  Please return immediately to emergency room for any new or concerning symptoms specially any worsening pain.  Continue taking her aspirin as prescribed

## 2022-10-22 NOTE — ED Provider Notes (Signed)
[facility] EMERGENCY DEPARTMENT Provider Note   CSN: [id number] Arrival date & time: 10/22/22  1736     History  Chief Complaint  Patient presents with   Leg Pain    Jill Barnes is a 61 y.o. adult.   Leg Pain Patient is a 61 year old female present emergency room today with complaints of right leg pain she is a smoker nondiabetic  She states that it has been hurting for approximately 1 week.  She denies any chest pain or difficulty breathing.  No lightheadedness or dizziness.  No nausea vomiting no other associated symptoms.  The pain seems to be somewhat worse occasionally when she is walking.  Sometimes feels somewhat better when it is lying dependently.  No trauma or falls.     Home Medications Prior to Admission medications   Medication Sig Start Date End Date Taking? Authorizing Provider  aspirin EC 81 MG tablet Take 81 mg by mouth daily. Swallow whole.    [provider]  atorvastatin (LIPITOR) 20 MG tablet Take 1 tablet (20 mg total) by mouth daily. 07/02/20   Elgergawy, Leana Roe, MD  Continuous Blood Gluc Sensor (FREESTYLE LIBRE 14 DAY SENSOR) MISC Change every 14 days 03/29/22   Romero Belling, MD  ibuprofen (ADVIL) 600 MG tablet Take 1 tablet (600 mg total) by mouth every 8 (eight) hours as needed. 08/27/21   Caccavale, Sophia, PA-C  insulin NPH Human (NOVOLIN N) 100 UNIT/ML injection Inject 1.8 mLs (180 Units total) into the skin every morning. 130 units each morning, and 35 units in the evening. 03/29/22   Romero Belling, MD  lisinopril (ZESTRIL) 5 MG tablet Take 1 tablet (5 mg total) by mouth daily. 10/13/20 09/24/21  Lorin Glass, MD  metoCLOPramide (REGLAN) 10 MG tablet Take 1 tablet (10 mg total) by mouth every 8 (eight) hours as needed for nausea. 08/27/21   Caccavale, Sophia, PA-C  ondansetron (ZOFRAN-ODT) 4 MG disintegrating tablet Take 1 tablet (4 mg total) by mouth every 8 (eight) hours as needed for nausea or vomiting. 03/23/22   Tanda Rockers, PA-C  oxyCODONE-acetaminophen (PERCOCET/ROXICET) 5-325 MG tablet Take 1 tablet by mouth every 6 (six) hours as needed for severe pain. 03/23/22   Hyman Hopes, Margaux, PA-C  pantoprazole (PROTONIX) 40 MG tablet Take 1 tablet (40 mg total) by mouth daily. 07/03/20   Elgergawy, Leana Roe, MD      Allergies    Sulfa antibiotics and Penicillins    Review of Systems   Review of Systems  Physical Exam Updated Vital Signs BP (!) 150/93   Pulse 88   Temp 97.8 F (36.6 C) (Oral)   Resp 18   SpO2 96%  Physical Exam Vitals and nursing note reviewed.  Constitutional:      General: She is not in acute distress.    Comments: Chronically unwell appearing 61 year old female, pleasant  HENT:     Head: Normocephalic and atraumatic.     Nose: Nose normal.  Eyes:     General: No scleral icterus. Cardiovascular:     Rate and Rhythm: Normal rate and regular rhythm.     Pulses: Normal pulses.     Heart sounds: Normal heart sounds.  Pulmonary:     Effort: Pulmonary effort is normal. No respiratory distress.     Breath sounds: No wheezing.  Abdominal:     Palpations: Abdomen is soft.     Tenderness: There is no abdominal tenderness.  Musculoskeletal:     Cervical back: Normal range of  motion.     Right lower leg: No edema.     Left lower leg: No edema.     Comments: Bilateral lower extremities with cap refill less than 2 seconds in all toes  DP pulses palpable in bilateral lower extremities somewhat diminished in right lower extremity 2+  Sensation intact in all aspects of foot  Skin:    General: Skin is warm and dry.     Capillary Refill: Capillary refill takes less than 2 seconds.  Neurological:     Mental Status: She is alert. Mental status is at baseline.  Psychiatric:        Mood and Affect: Mood normal.        Behavior: Behavior normal.     ED Results / Procedures / Treatments   Labs (all labs ordered are listed, but only abnormal results are displayed) Labs Reviewed - No  data to display  EKG None  Radiology US Venous Img Lower Unilateral Right  Result Date: 10/22/2022 CLINICAL DATA:  Right lower extremity swelling. EXAM: Right LOWER EXTREMITY VENOUS DOPPLER ULTRASOUND TECHNIQUE: Gray-scale sonography with compression, as well as color and duplex ultrasound, were performed to evaluate the deep venous system(s) from the level of the common femoral vein through the popliteal and proximal calf veins. COMPARISON:  None Available. FINDINGS: VENOUS Normal compressibility of the common femoral, superficial femoral, and popliteal veins, as well as the visualized calf veins. Visualized portions of profunda femoral vein and great saphenous vein unremarkable. No filling defects to suggest DVT on grayscale or color Doppler imaging. Doppler waveforms show normal direction of venous flow, normal respiratory plasticity and response to augmentation. Limited views of the contralateral common femoral vein are unremarkable. OTHER None. Limitations: none IMPRESSION: Negative. Electronically Signed   By: Elgie Collard M.D.   On: 10/22/2022 18:55    Procedures Procedures    Medications Ordered in ED Medications - No data to display  ED Course/ Medical Decision Making/ A&P                           Medical Decision Making  Patient is a 62 year old female present emergency room today with complaints of right leg pain she is a smoker nondiabetic  She states that it has been hurting for approximately 1 week.  She denies any chest pain or difficulty breathing.  No lightheadedness or dizziness.  No nausea vomiting no other associated symptoms.  The pain seems to be somewhat worse occasionally when she is walking.  Sometimes feels somewhat better when it is lying dependently.  No trauma or falls.  Patient has palpable pulses in bilateral lower extremities and sensation is intact with movement.  Cap refills less than 2 seconds  She is already on daily aspirin.  She does not have  evidence of critical limb ischemia.  I briefly discussed this case with my attending physician who agrees with my plan.  Will have patient follow-up with vascular surgery.  Return precautions discussed she would need to return to emergency room for worsening pain or any new or concerning symptoms.  Final Clinical Impression(s) / ED Diagnoses Final diagnoses:  Right leg pain    Rx / DC Orders ED Discharge Orders     None         Gailen Shelter, [place] 10/22/22 2034    Maia Plan, MD 10/28/22 1521

## 2022-10-26 ENCOUNTER — Other Ambulatory Visit: Payer: Self-pay

## 2022-10-26 ENCOUNTER — Emergency Department (HOSPITAL_BASED_OUTPATIENT_CLINIC_OR_DEPARTMENT_OTHER)
Admission: EM | Admit: 2022-10-26 | Discharge: 2022-10-26 | Disposition: A | Discharge status: Home / Self Care | Payer: Self-pay | Attending: Emergency Medicine | Admitting: Emergency Medicine

## 2022-10-26 ENCOUNTER — Emergency Department (HOSPITAL_BASED_OUTPATIENT_CLINIC_OR_DEPARTMENT_OTHER): Payer: Self-pay

## 2022-10-26 ENCOUNTER — Encounter (HOSPITAL_BASED_OUTPATIENT_CLINIC_OR_DEPARTMENT_OTHER): Payer: Self-pay | Admitting: Emergency Medicine

## 2022-10-26 DIAGNOSIS — R109 Unspecified abdominal pain: Secondary | ICD-10-CM | POA: Insufficient documentation

## 2022-10-26 DIAGNOSIS — J449 Chronic obstructive pulmonary disease, unspecified: Secondary | ICD-10-CM | POA: Insufficient documentation

## 2022-10-26 DIAGNOSIS — Z794 Long term (current) use of insulin: Secondary | ICD-10-CM | POA: Insufficient documentation

## 2022-10-26 DIAGNOSIS — D72829 Elevated white blood cell count, unspecified: Secondary | ICD-10-CM | POA: Insufficient documentation

## 2022-10-26 DIAGNOSIS — Z7982 Long term (current) use of aspirin: Secondary | ICD-10-CM | POA: Insufficient documentation

## 2022-10-26 DIAGNOSIS — Z8673 Personal history of transient ischemic attack (TIA), and cerebral infarction without residual deficits: Secondary | ICD-10-CM | POA: Insufficient documentation

## 2022-10-26 DIAGNOSIS — E1165 Type 2 diabetes mellitus with hyperglycemia: Secondary | ICD-10-CM | POA: Insufficient documentation

## 2022-10-26 DIAGNOSIS — E875 Hyperkalemia: Secondary | ICD-10-CM | POA: Insufficient documentation

## 2022-10-26 DIAGNOSIS — E871 Hypo-osmolality and hyponatremia: Secondary | ICD-10-CM | POA: Insufficient documentation

## 2022-10-26 LAB — BASIC METABOLIC PANEL
Anion gap: 8 (ref 5–15)
BUN: 12 mg/dL (ref 6–20)
CO2: 27 mmol/L (ref 22–32)
Calcium: 8.7 mg/dL — ABNORMAL LOW (ref 8.9–10.3)
Chloride: 95 mmol/L — ABNORMAL LOW (ref 98–111)
Creatinine, Ser: 1.09 mg/dL — ABNORMAL HIGH (ref 0.44–1.00)
GFR, Estimated: 58 mL/min — ABNORMAL LOW (ref >60)
Glucose, Bld: 313 mg/dL — ABNORMAL HIGH (ref 70–99)
Potassium: 5.8 mmol/L — ABNORMAL HIGH (ref 3.5–5.1)
Sodium: 130 mmol/L — ABNORMAL LOW (ref 135–145)

## 2022-10-26 LAB — CBC
HCT: 49.4 % — ABNORMAL HIGH (ref 36.0–46.0)
Hemoglobin: 16.3 g/dL — ABNORMAL HIGH (ref 12.0–15.0)
MCH: 30.6 pg (ref 26.0–34.0)
MCHC: 33 g/dL (ref 30.0–36.0)
MCV: 92.7 fL (ref 80.0–100.0)
Platelets: 271 10*3/uL (ref 150–400)
RBC: 5.33 MIL/uL — ABNORMAL HIGH (ref 3.87–5.11)
RDW: 13.5 % (ref 11.5–15.5)
WBC: 11 10*3/uL — ABNORMAL HIGH (ref 4.0–10.5)
nRBC: 0 % (ref 0.0–0.2)

## 2022-10-26 LAB — URINALYSIS, ROUTINE W REFLEX MICROSCOPIC
Bilirubin Urine: NEGATIVE
Glucose, UA: >=500 mg/dL — AB
Hgb urine dipstick: NEGATIVE
Ketones, ur: NEGATIVE mg/dL
Leukocytes,Ua: NEGATIVE
Nitrite: NEGATIVE
Protein, ur: 30 mg/dL — AB
Specific Gravity, Urine: >=1.03 (ref 1.005–1.030)
pH: 5.5 (ref 5.0–8.0)

## 2022-10-26 LAB — URINALYSIS, MICROSCOPIC (REFLEX): WBC, UA: NONE SEEN WBC/hpf (ref 0–5)

## 2022-10-26 LAB — TROPONIN I (HIGH SENSITIVITY): Troponin I (High Sensitivity): 7 ng/L (ref <18)

## 2022-10-26 MED ORDER — HYDROMORPHONE HCL 1 MG/ML IJ SOLN
0.5000 mg | Freq: Once | INTRAMUSCULAR | Status: AC
  Administered 2022-10-26: 0.5 mg via INTRAVENOUS
  Filled 2022-10-26: qty 1

## 2022-10-26 MED ORDER — SODIUM ZIRCONIUM CYCLOSILICATE 10 G PO PACK
10.0000 g | PACK | Freq: Every day | ORAL | Status: DC
  Administered 2022-10-26: 10 g via ORAL
  Filled 2022-10-26: qty 1

## 2022-10-26 MED ORDER — SODIUM CHLORIDE 0.9 % IV BOLUS
1000.0000 mL | Freq: Once | INTRAVENOUS | Status: AC
  Administered 2022-10-26: 1000 mL via INTRAVENOUS

## 2022-10-26 MED ORDER — KETOROLAC TROMETHAMINE 30 MG/ML IJ SOLN
30.0000 mg | Freq: Once | INTRAMUSCULAR | Status: AC
  Administered 2022-10-26: 30 mg via INTRAVENOUS
  Filled 2022-10-26: qty 1

## 2022-10-26 MED ORDER — ONDANSETRON HCL 4 MG/2ML IJ SOLN
4.0000 mg | Freq: Once | INTRAMUSCULAR | Status: AC
  Administered 2022-10-26: 4 mg via INTRAVENOUS
  Filled 2022-10-26: qty 2

## 2022-10-26 NOTE — ED Triage Notes (Signed)
BIB EMS c/o R. Flank pain since last night. C/o increased pain after urination. Urinary frequency

## 2022-10-26 NOTE — ED Provider Notes (Signed)
[facility] EMERGENCY DEPARTMENT Provider Note   CSN: [id number] Arrival date & time: 10/26/22  1443     History  Chief Complaint  Patient presents with   Flank Pain    Jill Barnes is a 61 y.o. adult with medical history of COPD, diabetes, kidney stones, recent CVA.  Patient presents to ED for evaluation of right-sided flank pain.  Patient reports that beginning last night she developed right-sided flank pain and discomfort that is steadily worsened since that time.  Patient states that the pain wraps from her groin down to the right side of her back.  Patient also endorsing urinary frequency, pain after urination.  Patient denies any nausea, vomiting, fevers. Patient denies chest pain, shortness of breath.    Flank Pain       Home Medications Prior to Admission medications   Medication Sig Start Date End Date Taking? Authorizing Provider  aspirin EC 81 MG tablet Take 81 mg by mouth daily. Swallow whole.    [provider]  atorvastatin (LIPITOR) 20 MG tablet Take 1 tablet (20 mg total) by mouth daily. 07/02/20   Elgergawy, Leana Roe, MD  Continuous Blood Gluc Sensor (FREESTYLE LIBRE 14 DAY SENSOR) MISC Change every 14 days 03/29/22   Romero Belling, MD  ibuprofen (ADVIL) 600 MG tablet Take 1 tablet (600 mg total) by mouth every 8 (eight) hours as needed. 08/27/21   Caccavale, Sophia, PA-C  insulin NPH Human (NOVOLIN N) 100 UNIT/ML injection Inject 1.8 mLs (180 Units total) into the skin every morning. 130 units each morning, and 35 units in the evening. 03/29/22   Romero Belling, MD  lisinopril (ZESTRIL) 5 MG tablet Take 1 tablet (5 mg total) by mouth daily. 10/13/20 09/24/21  Lorin Glass, MD  metoCLOPramide (REGLAN) 10 MG tablet Take 1 tablet (10 mg total) by mouth every 8 (eight) hours as needed for nausea. 08/27/21   Caccavale, Sophia, PA-C  ondansetron (ZOFRAN-ODT) 4 MG disintegrating tablet Take 1 tablet (4 mg total) by mouth every 8 (eight) hours as needed for  nausea or vomiting. 03/23/22   Tanda Rockers, PA-C  oxyCODONE-acetaminophen (PERCOCET/ROXICET) 5-325 MG tablet Take 1 tablet by mouth every 6 (six) hours as needed for severe pain. 03/23/22   Hyman Hopes, Margaux, PA-C  pantoprazole (PROTONIX) 40 MG tablet Take 1 tablet (40 mg total) by mouth daily. 07/03/20   Elgergawy, Leana Roe, MD      Allergies    Sulfa antibiotics and Penicillins    Review of Systems   Review of Systems  Constitutional:  Negative for fever.  Gastrointestinal:  Negative for nausea and vomiting.  Genitourinary:  Positive for dysuria, flank pain and frequency.  All other systems reviewed and are negative.   Physical Exam Updated Vital Signs BP (!) 115/57   Pulse 83   Temp 98.2 F (36.8 C) (Oral)   Resp 17   Ht 5\' 6"  (1.676 m)   Wt 104.3 kg   SpO2 93%   BMI 37.12 kg/m  Physical Exam Vitals and nursing note reviewed.  Constitutional:      General: She is not in acute distress.    Appearance: Normal appearance. She is not ill-appearing, toxic-appearing or diaphoretic.  HENT:     Head: Normocephalic and atraumatic.     Nose: Nose normal. No congestion.     Mouth/Throat:     Mouth: Mucous membranes are moist.     Pharynx: Oropharynx is clear.  Eyes:     Extraocular Movements: Extraocular movements intact.  Conjunctiva/sclera: Conjunctivae normal.     Pupils: Pupils are equal, round, and reactive to light.  Cardiovascular:     Rate and Rhythm: Normal rate and regular rhythm.  Pulmonary:     Effort: Pulmonary effort is normal.     Breath sounds: Normal breath sounds. No wheezing.  Abdominal:     General: Abdomen is flat. Bowel sounds are normal.     Palpations: Abdomen is soft.     Tenderness: There is no abdominal tenderness. There is right CVA tenderness.  Musculoskeletal:     Cervical back: Normal range of motion and neck supple. No tenderness.  Skin:    General: Skin is warm and dry.     Capillary Refill: Capillary refill takes less than 2 seconds.   Neurological:     Mental Status: She is alert and oriented to person, place, and time.     ED Results / Procedures / Treatments   Labs (all labs ordered are listed, but only abnormal results are displayed) Labs Reviewed  URINALYSIS, ROUTINE W REFLEX MICROSCOPIC - Abnormal; Notable for the following components:      Result Value   Glucose, UA >=500 (*)    Protein, ur 30 (*)    All other components within normal limits  BASIC METABOLIC PANEL - Abnormal; Notable for the following components:   Sodium 130 (*)    Potassium 5.8 (*)    Chloride 95 (*)    Glucose, Bld 313 (*)    Creatinine, Ser 1.09 (*)    Calcium 8.7 (*)    GFR, Estimated 58 (*)    All other components within normal limits  CBC - Abnormal; Notable for the following components:   WBC 11.0 (*)    RBC 5.33 (*)    Hemoglobin 16.3 (*)    HCT 49.4 (*)    All other components within normal limits  URINALYSIS, MICROSCOPIC (REFLEX) - Abnormal; Notable for the following components:   Bacteria, UA RARE (*)    All other components within normal limits  URINE CULTURE  TROPONIN I (HIGH SENSITIVITY)    EKG None  Radiology CT Renal Stone Study  Result Date: 10/26/2022 CLINICAL DATA:  Abdominal/flank pain.  Stone suspected. EXAM: CT ABDOMEN AND PELVIS WITHOUT CONTRAST TECHNIQUE: Multidetector CT imaging of the abdomen and pelvis was performed following the standard protocol without IV contrast. RADIATION DOSE REDUCTION: This exam was performed according to the departmental dose-optimization program which includes automated exposure control, adjustment of the mA and/or kV according to patient size and/or use of iterative reconstruction technique. COMPARISON:  03/23/2022 FINDINGS: Lower chest: No acute abnormality. Hepatobiliary: No focal liver abnormality is seen. No gallstones, gallbladder wall thickening, or biliary dilatation. Pancreas: Unremarkable. No pancreatic ductal dilatation or surrounding inflammatory changes. Spleen:  Normal in size without focal abnormality. Adrenals/Urinary Tract: Normal adrenal glands. No nephrolithiasis, hydronephrosis or mass identified bilaterally. No hydroureter or ureteral calculi. Urinary bladder is unremarkable. Stomach/Bowel: Stomach is within normal limits. The appendix is not clearly visualized. No secondary signs of acute appendicitis. No evidence of bowel wall thickening, distention, or inflammatory changes. Vascular/Lymphatic: Aortic atherosclerosis. No aneurysm. No signs of abdominal or pelvic adenopathy. Reproductive: Status post hysterectomy. Low-attenuation cystic mass is again noted within the left adnexa. This measures 10.1 by 7.7 cm, image 79/2. Previously 12.5 x 8.1 cm. Other: No free fluid or fluid collections identified. No signs of pneumoperitoneum. Subcutaneous soft tissue stranding noted within the left lower quadrant ventral abdominal wall without associated fluid collection or mass, image  71/2. Similar finding on previous exam may be related to injection site. Musculoskeletal: No acute or significant osseous findings. IMPRESSION: 1. No acute findings within the abdomen or pelvis. 2. No nephrolithiasis or hydronephrosis. 3. Mild decrease in size of low-attenuation cystic mass within the left adnexa. This has been present since at least 2018, at which time gynecologic consultation was advised. Given the relative stability of this lesion findings are favored to represent a benign indolent process. Recommend referral to gynecology for further management. 4.  Aortic Atherosclerosis (ICD10-I70.0). Electronically Signed   By: Signa Kell M.D.   On: 10/26/2022 17:49    Procedures Procedures   Medications Ordered in ED Medications  sodium zirconium cyclosilicate (LOKELMA) packet 10 g (10 g Oral Given 10/26/22 1726)  ondansetron (ZOFRAN) injection 4 mg (4 mg Intravenous Given 10/26/22 1727)  sodium chloride 0.9 % bolus 1,000 mL (1,000 mLs Intravenous New Bag/Given 10/26/22 1726)   ketorolac (TORADOL) 30 MG/ML injection 30 mg (30 mg Intravenous Given 10/26/22 1727)  HYDROmorphone (DILAUDID) injection 0.5 mg (0.5 mg Intravenous Given 10/26/22 1832)    ED Course/ Medical Decision Making/ A&P                           Medical Decision Making Amount and/or Complexity of Data Reviewed Labs: ordered. Radiology: ordered.  Risk Prescription drug management.   61 year old female presents to the ED for evaluation of right-sided flank pain.  Please see HPI for further details.  On examination patient is afebrile and nontachycardic.  The patient sounds clear bilaterally, she is not hypoxic.  Patient abdomen is soft and compressible throughout.  Patient does have right-sided CVA tenderness.  Patient neurological examination at baseline.  Patient work-up will include CBC, BMP, urinalysis, CT renal stone study.  Patient initially treated with 1 L IV fluid, 4 mg Zofran for nausea, 30 mg Toradol for pain.  Patient CBC with a slight leukocytosis to 11 however no anemia.  The patient BMP shows decreased sodium to 130, elevated potassium of 5.8, elevated glucose of 313, elevated creatinine of 1.09.  There is no anion gap.  Patient reports compliance on diabetic medication, insulin.  Patient denies any chest pain or shortness of breath.  EKG ordered at this time.  10 g Lokelma ordered at this time.  CT renal stone study does not show any evidence of nephrolithiasis or ureterolithiasis. I have sent the patient urine for culture.  On reevaluation, patient reports that she feels better.  Patient still complaining of slight pain so 0.5 mg of Dilaudid was administered.  After discussion with my attending Dr. Criss Alvine, we feel that the patient should have 1 troponin drawn just to rule out any kind of cardiac cause of her flank pain.  The patient is amenable to this.    At the end of my shift, the patient troponin did not resulted.  Patient signed out to Dr. Criss Alvine pending  troponin.  Final Clinical Impression(s) / ED Diagnoses Final diagnoses:  Right flank pain    Rx / DC Orders ED Discharge Orders     None         Al Decant, PA-C 10/26/22 Carlis Stable    Pricilla Loveless, MD 10/30/22 [phone]

## 2022-10-26 NOTE — ED Provider Notes (Signed)
Care transferred to me.  Patient's troponin is normal.  Doubt this is atypical ACS.  We discussed her elevated potassium and needing to stop lisinopril for now and follow-up with her PCP to get this rechecked.   Pricilla Loveless, MD 10/26/22 [phone]

## 2022-10-26 NOTE — Discharge Instructions (Signed)
Potassium today was elevated at 5.8.  For now, STOP taking the LISINOPRIL.  This can elevate your potassium.  You need to get your potassium rechecked, ideally later this week such as on Friday.  If you develop worsening, recurrent, or continued back pain, numbness or weakness in the legs, incontinence of your bowels or bladders, numbness of your buttocks, fever, abdominal pain, or any other new/concerning symptoms then return to the ER for evaluation.

## 2022-10-28 LAB — URINE CULTURE

## 2023-07-05 ENCOUNTER — Other Ambulatory Visit: Payer: Self-pay

## 2023-07-05 ENCOUNTER — Emergency Department (HOSPITAL_BASED_OUTPATIENT_CLINIC_OR_DEPARTMENT_OTHER): Payer: Self-pay

## 2023-07-05 ENCOUNTER — Emergency Department (HOSPITAL_BASED_OUTPATIENT_CLINIC_OR_DEPARTMENT_OTHER)
Admission: EM | Admit: 2023-07-05 | Discharge: 2023-07-05 | Disposition: A | Discharge status: Home / Self Care | Payer: Self-pay | Attending: Emergency Medicine | Admitting: Emergency Medicine

## 2023-07-05 ENCOUNTER — Encounter (HOSPITAL_BASED_OUTPATIENT_CLINIC_OR_DEPARTMENT_OTHER): Payer: Self-pay | Admitting: Emergency Medicine

## 2023-07-05 DIAGNOSIS — I509 Heart failure, unspecified: Secondary | ICD-10-CM | POA: Insufficient documentation

## 2023-07-05 DIAGNOSIS — Z8673 Personal history of transient ischemic attack (TIA), and cerebral infarction without residual deficits: Secondary | ICD-10-CM | POA: Insufficient documentation

## 2023-07-05 DIAGNOSIS — R7989 Other specified abnormal findings of blood chemistry: Secondary | ICD-10-CM

## 2023-07-05 DIAGNOSIS — Z79899 Other long term (current) drug therapy: Secondary | ICD-10-CM | POA: Insufficient documentation

## 2023-07-05 DIAGNOSIS — R0789 Other chest pain: Secondary | ICD-10-CM | POA: Insufficient documentation

## 2023-07-05 DIAGNOSIS — R778 Other specified abnormalities of plasma proteins: Secondary | ICD-10-CM | POA: Insufficient documentation

## 2023-07-05 DIAGNOSIS — Z7982 Long term (current) use of aspirin: Secondary | ICD-10-CM | POA: Insufficient documentation

## 2023-07-05 DIAGNOSIS — Z7951 Long term (current) use of inhaled steroids: Secondary | ICD-10-CM | POA: Insufficient documentation

## 2023-07-05 DIAGNOSIS — Z794 Long term (current) use of insulin: Secondary | ICD-10-CM | POA: Insufficient documentation

## 2023-07-05 DIAGNOSIS — E119 Type 2 diabetes mellitus without complications: Secondary | ICD-10-CM | POA: Insufficient documentation

## 2023-07-05 DIAGNOSIS — R0602 Shortness of breath: Secondary | ICD-10-CM | POA: Insufficient documentation

## 2023-07-05 DIAGNOSIS — I110 Hypertensive heart disease with heart failure: Secondary | ICD-10-CM | POA: Insufficient documentation

## 2023-07-05 DIAGNOSIS — Z7901 Long term (current) use of anticoagulants: Secondary | ICD-10-CM | POA: Insufficient documentation

## 2023-07-05 DIAGNOSIS — R944 Abnormal results of kidney function studies: Secondary | ICD-10-CM | POA: Insufficient documentation

## 2023-07-05 DIAGNOSIS — R7982 Elevated C-reactive protein (CRP): Secondary | ICD-10-CM | POA: Insufficient documentation

## 2023-07-05 DIAGNOSIS — J449 Chronic obstructive pulmonary disease, unspecified: Secondary | ICD-10-CM | POA: Insufficient documentation

## 2023-07-05 LAB — BASIC METABOLIC PANEL
Anion gap: 9 (ref 5–15)
BUN: 10 mg/dL (ref 8–23)
CO2: 24 mmol/L (ref 22–32)
Calcium: 8.7 mg/dL — ABNORMAL LOW (ref 8.9–10.3)
Chloride: 101 mmol/L (ref 98–111)
Creatinine, Ser: 1.07 mg/dL — ABNORMAL HIGH (ref 0.44–1.00)
GFR, Estimated: 59 mL/min — ABNORMAL LOW (ref >60)
Glucose, Bld: 217 mg/dL — ABNORMAL HIGH (ref 70–99)
Potassium: 4.1 mmol/L (ref 3.5–5.1)
Sodium: 134 mmol/L — ABNORMAL LOW (ref 135–145)

## 2023-07-05 LAB — CBC
HCT: 42.9 % (ref 36.0–46.0)
Hemoglobin: 14.2 g/dL (ref 12.0–15.0)
MCH: 31.8 pg (ref 26.0–34.0)
MCHC: 33.1 g/dL (ref 30.0–36.0)
MCV: 96 fL (ref 80.0–100.0)
Platelets: 214 10*3/uL (ref 150–400)
RBC: 4.47 MIL/uL (ref 3.87–5.11)
RDW: 13.8 % (ref 11.5–15.5)
WBC: 8.3 10*3/uL (ref 4.0–10.5)
nRBC: 0 % (ref 0.0–0.2)

## 2023-07-05 LAB — TROPONIN I (HIGH SENSITIVITY)
Troponin I (High Sensitivity): 33 ng/L — ABNORMAL HIGH (ref <18)
Troponin I (High Sensitivity): 28 ng/L — ABNORMAL HIGH (ref <18)

## 2023-07-05 LAB — BRAIN NATRIURETIC PEPTIDE: B Natriuretic Peptide: 140.2 pg/mL — ABNORMAL HIGH (ref 0.0–100.0)

## 2023-07-05 MED ORDER — FUROSEMIDE 20 MG PO TABS: 20.0000 mg | ORAL_TABLET | Freq: Every day | ORAL | 0 refills | Status: DC

## 2023-07-05 MED ORDER — ALBUTEROL SULFATE HFA 108 (90 BASE) MCG/ACT IN AERS: 2.0000 | INHALATION_SPRAY | RESPIRATORY_TRACT | Status: DC | PRN

## 2023-07-05 MED ORDER — IOHEXOL 350 MG/ML SOLN
100.0000 mL | Freq: Once | INTRAVENOUS | Status: AC | PRN
  Administered 2023-07-05: 100 mL via INTRAVENOUS

## 2023-07-05 MED ORDER — ALBUTEROL SULFATE HFA 108 (90 BASE) MCG/ACT IN AERS: 1.0000 | INHALATION_SPRAY | Freq: Four times a day (QID) | RESPIRATORY_TRACT | 0 refills | Status: DC | PRN

## 2023-07-05 NOTE — ED Triage Notes (Signed)
Shortness of breath started yesterday with chest pressure . Unable to speak full sentence , Hx COPD.

## 2023-07-05 NOTE — ED Provider Notes (Signed)
[facility] Provider Note   CSN: [id number] Arrival date & time: 07/05/23  0908     History  Chief Complaint  Patient presents with   Shortness of Breath    Jill Barnes is a 62 y.o. adult with past medical history significant for COPD, tobacco abuse, diabetes, previous DVT, PE who is not currently taking any anticoagulation, CHF who presents concern for shortness of breath that started yesterday associated with chest pressure.  Patient reports worse with taking deep breath.  She denies exertional component of the chest pressure.  She denies any recent travel.  No previous history of cancer.  When asked about her previous PE reports that she was only on a blood thinner for about a month previous blood clot was around 1 year ago.  She denies any chest pain at rest.  She denies any nausea, vomiting, radiation of chest tightness to arms, neck.  She does endorse some cold sensation of her right lower extremity compared to the left denies overt swelling.   Shortness of Breath      Home Medications Prior to Admission medications   Medication Sig Start Date End Date Taking? Authorizing Provider  albuterol (VENTOLIN HFA) 108 (90 Base) MCG/ACT inhaler Inhale 1-2 puffs into the lungs every 6 (six) hours as needed for wheezing or shortness of breath. 07/05/23  Yes ,  H, PA-C  furosemide (LASIX) 20 MG tablet Take 1 tablet (20 mg total) by mouth daily. 07/05/23  Yes ,  H, PA-C  aspirin EC 81 MG tablet Take 81 mg by mouth daily. Swallow whole.    [provider]  atorvastatin (LIPITOR) 20 MG tablet Take 1 tablet (20 mg total) by mouth daily. 07/02/20   Elgergawy, Leana Roe, MD  Continuous Blood Gluc Sensor (FREESTYLE LIBRE 14 DAY SENSOR) MISC Change every 14 days 03/29/22   Romero Belling, MD  ibuprofen (ADVIL) 600 MG tablet Take 1 tablet (600 mg total) by mouth every 8 (eight) hours as needed. 08/27/21   Caccavale,  Sophia, PA-C  insulin NPH Human (NOVOLIN N) 100 UNIT/ML injection Inject 1.8 mLs (180 Units total) into the skin every morning. 130 units each morning, and 35 units in the evening. 03/29/22   Romero Belling, MD  lisinopril (ZESTRIL) 5 MG tablet Take 1 tablet (5 mg total) by mouth daily. 10/13/20 09/24/21  Lorin Glass, MD  metoCLOPramide (REGLAN) 10 MG tablet Take 1 tablet (10 mg total) by mouth every 8 (eight) hours as needed for nausea. 08/27/21   Caccavale, Sophia, PA-C  ondansetron (ZOFRAN-ODT) 4 MG disintegrating tablet Take 1 tablet (4 mg total) by mouth every 8 (eight) hours as needed for nausea or vomiting. 03/23/22   Tanda Rockers, PA-C  oxyCODONE-acetaminophen (PERCOCET/ROXICET) 5-325 MG tablet Take 1 tablet by mouth every 6 (six) hours as needed for severe pain. 03/23/22   Hyman Hopes, Margaux, PA-C  pantoprazole (PROTONIX) 40 MG tablet Take 1 tablet (40 mg total) by mouth daily. 07/03/20   Elgergawy, Leana Roe, MD      Allergies    Sulfa antibiotics and Penicillins    Review of Systems   Review of Systems  Respiratory:  Positive for shortness of breath.   All other systems reviewed and are negative.   Physical Exam Updated Vital Signs BP (!) 154/76   Pulse 80   Temp 98 F (36.7 C)   Resp (!) 26   Wt 110.7 kg   SpO2 93%   BMI 39.38 kg/m  Physical Exam Vitals and nursing note reviewed.  Constitutional:      General: She is not in acute distress.    Appearance: Normal appearance.  HENT:     Head: Normocephalic and atraumatic.  Eyes:     General:        Right eye: No discharge.        Left eye: No discharge.  Cardiovascular:     Rate and Rhythm: Normal rate and regular rhythm.     Heart sounds: No murmur heard.    No friction rub. No gallop.  Pulmonary:     Effort: Pulmonary effort is normal.     Breath sounds: Normal breath sounds.     Comments: Patient with some apparent discomfort on deep breathing, occasional hacking cough, no significant wheezing, rhonchi, stridor on  auscultation of bilateral lung fields. Abdominal:     General: Bowel sounds are normal.     Palpations: Abdomen is soft.  Musculoskeletal:     Comments: Patient with some tenderness to palpation of the right calf, no significant discrepancy in leg  Skin:    General: Skin is warm and dry.     Capillary Refill: Capillary refill takes less than 2 seconds.  Neurological:     Mental Status: She is alert and oriented to person, place, and time.  Psychiatric:        Mood and Affect: Mood normal.        Behavior: Behavior normal.     ED Results / Procedures / Treatments   Labs (all labs ordered are listed, but only abnormal results are displayed) Labs Reviewed  BASIC METABOLIC PANEL - Abnormal; Notable for the following components:      Result Value   Sodium 134 (*)    Glucose, Bld 217 (*)    Creatinine, Ser 1.07 (*)    Calcium 8.7 (*)    GFR, Estimated 59 (*)    All other components within normal limits  BRAIN NATRIURETIC PEPTIDE - Abnormal; Notable for the following components:   B Natriuretic Peptide 140.2 (*)    All other components within normal limits  TROPONIN I (HIGH SENSITIVITY) - Abnormal; Notable for the following components:   Troponin I (High Sensitivity) 33 (*)    All other components within normal limits  TROPONIN I (HIGH SENSITIVITY) - Abnormal; Notable for the following components:   Troponin I (High Sensitivity) 28 (*)    All other components within normal limits  CBC    EKG EKG Interpretation Date/Time:  Tuesday July 05 2023 09:23:18 EDT Ventricular Rate:  85 PR Interval:  138 QRS Duration:  95 QT Interval:  376 QTC Calculation: 448 R Axis:   71  Text Interpretation: Sinus rhythm Consider left atrial enlargement Minimal ST depression, inferior leads No significant change since last tracing Confirmed by Benjiman Core [phone]) on 07/05/2023 11:02:19 AM  Radiology CT Angio Chest PE W and/or Wo Contrast  Result Date: 07/05/2023 CLINICAL DATA:   Pulmonary embolism (PE) suspected, high prob EXAM: CT ANGIOGRAPHY CHEST WITH CONTRAST TECHNIQUE: Multidetector CT imaging of the chest was performed using the standard protocol during bolus administration of intravenous contrast. Multiplanar CT image reconstructions and MIPs were obtained to evaluate the vascular anatomy. RADIATION DOSE REDUCTION: This exam was performed according to the departmental dose-optimization program which includes automated exposure control, adjustment of the mA and/or kV according to patient size and/or use of iterative reconstruction technique. CONTRAST:  OMNIPAQUE IOHEXOL 350 MG/ML SOLN COMPARISON:  10/11/2020 FINDINGS: Cardiovascular: Satisfactory  opacification of pulmonary arteries noted, and there is no evidence of pulmonary emboli. Scattered coronary calcifications. Scattered plaque in the aortic arch and descending thoracic aorta without dissection or aneurysm. Mediastinum/Nodes: Negative Lungs/Pleura: Negative, with some motion degradation. Upper Abdomen: Negative Musculoskeletal: Negative Review of the MIP images confirms the above findings. IMPRESSION: 1. Negative for acute PE or aortic dissection. 2. Coronary and aortic Atherosclerosis (ICD10-I70.0). Electronically Signed   By: Corlis Leak M.D.   On: 07/05/2023 12:23   DG Chest Port 1 View  Result Date: 07/05/2023 CLINICAL DATA:  Shortness of breath and chest pressure. EXAM: PORTABLE CHEST 1 VIEW COMPARISON:  08/27/2021 FINDINGS: Cardiopericardial silhouette is at upper limits of normal for size. The lungs are clear without focal pneumonia, edema, pneumothorax or pleural effusion. Similar chronic atelectasis or linear scarring at the left base. No acute bony abnormality. Telemetry leads overlie the chest. IMPRESSION: No active disease. Electronically Signed   By: Kennith Center M.D.   On: 07/05/2023 10:45   US Venous Img Lower Right (DVT Study)  Result Date: 07/05/2023 CLINICAL DATA:  Right lower extremity swelling  for weeks Shortness of breath EXAM: RIGHT LOWER EXTREMITY VENOUS DOPPLER ULTRASOUND TECHNIQUE: Gray-scale sonography with compression, as well as color and duplex ultrasound, were performed to evaluate the deep venous system(s) from the level of the common femoral vein through the popliteal and proximal calf veins. COMPARISON:  10/22/2022 FINDINGS: VENOUS Normal compressibility of the common femoral, superficial femoral, and popliteal veins, as well as the visualized calf veins. Visualized portions of profunda femoral vein and great saphenous vein unremarkable. No filling defects to suggest DVT on grayscale or color Doppler imaging. Doppler waveforms show normal direction of venous flow, normal respiratory plasticity and response to augmentation. Limited views of the contralateral common femoral vein are unremarkable. OTHER None. Limitations: none IMPRESSION: No right lower extremity DVT. Electronically Signed   By: Acquanetta Belling M.D.   On: 07/05/2023 10:28    Procedures Procedures    Medications Ordered in ED Medications  albuterol (VENTOLIN HFA) 108 (90 Base) MCG/ACT inhaler 2 puff (has no administration in time range)  iohexol (OMNIPAQUE) 350 MG/ML injection 100 mL (100 mLs Intravenous Contrast Given 07/05/23 1105)    ED Course/ Medical Decision Making/ A&P                                 Medical Decision Making Amount and/or Complexity of Data Reviewed Labs: ordered. Radiology: ordered.  Risk Prescription drug management.   This patient is a 62 y.o. adult  who presents to the ED for concern of shortness of breath.   Differential diagnoses prior to evaluation: The emergent differential diagnosis includes, but is not limited to,  asthma exacerbation, COPD exacerbation, acute upper respiratory infection, acute bronchitis, chronic bronchitis, interstitial lung disease, ARDS, PE, pneumonia, atypical ACS, carbon monoxide poisoning, spontaneous pneumothorax, new CHF vs CHF exacerbation, versus  other . This is not an exhaustive differential.   Past Medical History / Co-morbidities / Social History: COPD, diabetes, pulmonary embolism, DVT, CHF, previous stroke, not currently on blood thinner  Additional history: Chart reviewed. Pertinent results include: Reviewed remote echo from 2021, 60-65% ventricular ejection fraction at this time.  Physical Exam: Physical exam performed. The pertinent findings include: Patient was some intermittent tachypnea, she had mild the saturation of oxygen while resting, suspicious for possible obstructive sleep apnea.  She has no wheezing, rhonchi, stridor, rales.  When she is  awake, alert she does not have persistent tachypnea.  She is otherwise well-appearing, she does have some moderate hypertension, blood pressure 154/76 at time of discharge.  Lab Tests/Imaging studies: I personally interpreted labs/imaging and the pertinent results include: CBC overall unremarkable, notably with no leukocytosis.  BMP with mild pseudohyponatremia, glucose 217, with a sodium of 134.  Mildly elevated creatinine at 1.07.  BNP is elevated at 140.2, and troponin elevated at 33 with delta at 28.  No evidence of acute ACS, suspicious for mild troponin leak secondary to fluid exacerbation.  I independently interpreted plain film chest x-ray, CT PE study, and ultrasound of right lower extremity.  Patient with no acute DVT, PE or other acute intrathoracic abnormality.. I agree with the radiologist interpretation.  Cardiac monitoring: EKG obtained and interpreted by myself and attending physician which shows: nsr, minimal st depression in inferior leads, but no significant change from last tracing   Medications: Will discharge patient with 5 days of Lasix, and inhaler, as well as will order cardiology follow-up for this patient.  Discussed extensive return precautions.   Disposition: After consideration of the diagnostic results and the patients response to treatment, I feel that  patient with mild CHF exacerbation, and shortness of breath may be slightly due to COPD although no overt wheezing on my exam today.  Encourage close follow-up and discussed extensive return precautions.Marland Kitchen   emergency department workup does not suggest an emergent condition requiring admission or immediate intervention beyond what has been performed at this time. The plan is: as above. The patient is safe for discharge and has been instructed to return immediately for worsening symptoms, change in symptoms or any other concerns.  Final Clinical Impression(s) / ED Diagnoses Final diagnoses:  Chest tightness  SOB (shortness of breath)  Elevated brain natriuretic peptide (BNP) level    Rx / DC Orders ED Discharge Orders          Ordered    Ambulatory referral to Cardiology        07/05/23 1351    albuterol (VENTOLIN HFA) 108 (90 Base) MCG/ACT inhaler  Every 6 hours PRN        07/05/23 1351    furosemide (LASIX) 20 MG tablet  Daily        07/05/23 1351              , [place], PA-C 07/05/23 1408    Benjiman Core, MD 07/05/23 1423

## 2023-07-05 NOTE — Discharge Instructions (Signed)
As we discussed I have placed a referral to cardiology, recommend that you follow-up with them, please take the fluid pill that I prescribed for the next 5 days, and use the inhaler as needed, please return if you have worsening chest pain, shortness of breath despite the above

## 2023-07-05 NOTE — ED Notes (Signed)
Urine specimen in lab 

## 2023-07-05 NOTE — ED Notes (Signed)
 Fall risk armband Fall risk sign on door

## 2023-07-29 NOTE — Progress Notes (Signed)
This encounter was created in error - please disregard.

## 2023-08-19 ENCOUNTER — Emergency Department (HOSPITAL_COMMUNITY): Payer: Medicare Other | Admitting: Certified Registered Nurse Anesthetist

## 2023-08-19 ENCOUNTER — Inpatient Hospital Stay (HOSPITAL_COMMUNITY): Payer: Medicare Other

## 2023-08-19 ENCOUNTER — Inpatient Hospital Stay (HOSPITAL_COMMUNITY)
Admission: EM | Admit: 2023-08-19 | Discharge: 2023-09-23 | DRG: 023 | Disposition: E | Payer: Medicare Other | Attending: Neurology | Admitting: Neurology

## 2023-08-19 ENCOUNTER — Emergency Department (HOSPITAL_COMMUNITY): Payer: Medicare Other

## 2023-08-19 ENCOUNTER — Encounter (HOSPITAL_COMMUNITY): Payer: Self-pay

## 2023-08-19 ENCOUNTER — Encounter (HOSPITAL_COMMUNITY): Admission: EM | Disposition: E | Payer: Self-pay | Source: Home / Self Care | Attending: Neurology

## 2023-08-19 DIAGNOSIS — N170 Acute kidney failure with tubular necrosis: Secondary | ICD-10-CM | POA: Diagnosis not present

## 2023-08-19 DIAGNOSIS — I69354 Hemiplegia and hemiparesis following cerebral infarction affecting left non-dominant side: Secondary | ICD-10-CM | POA: Diagnosis not present

## 2023-08-19 DIAGNOSIS — Z881 Allergy status to other antibiotic agents status: Secondary | ICD-10-CM

## 2023-08-19 DIAGNOSIS — Z7982 Long term (current) use of aspirin: Secondary | ICD-10-CM

## 2023-08-19 DIAGNOSIS — Z86711 Personal history of pulmonary embolism: Secondary | ICD-10-CM | POA: Diagnosis not present

## 2023-08-19 DIAGNOSIS — Z880 Allergy status to penicillin: Secondary | ICD-10-CM

## 2023-08-19 DIAGNOSIS — J9602 Acute respiratory failure with hypercapnia: Secondary | ICD-10-CM | POA: Diagnosis present

## 2023-08-19 DIAGNOSIS — I63411 Cerebral infarction due to embolism of right middle cerebral artery: Principal | ICD-10-CM | POA: Diagnosis present

## 2023-08-19 DIAGNOSIS — I5032 Chronic diastolic (congestive) heart failure: Secondary | ICD-10-CM | POA: Diagnosis present

## 2023-08-19 DIAGNOSIS — Z882 Allergy status to sulfonamides status: Secondary | ICD-10-CM

## 2023-08-19 DIAGNOSIS — G43909 Migraine, unspecified, not intractable, without status migrainosus: Secondary | ICD-10-CM | POA: Diagnosis present

## 2023-08-19 DIAGNOSIS — E871 Hypo-osmolality and hyponatremia: Secondary | ICD-10-CM | POA: Diagnosis present

## 2023-08-19 DIAGNOSIS — I63412 Cerebral infarction due to embolism of left middle cerebral artery: Principal | ICD-10-CM

## 2023-08-19 DIAGNOSIS — R414 Neurologic neglect syndrome: Secondary | ICD-10-CM | POA: Diagnosis present

## 2023-08-19 DIAGNOSIS — I609 Nontraumatic subarachnoid hemorrhage, unspecified: Secondary | ICD-10-CM | POA: Diagnosis not present

## 2023-08-19 DIAGNOSIS — D6489 Other specified anemias: Secondary | ICD-10-CM | POA: Diagnosis not present

## 2023-08-19 DIAGNOSIS — Z66 Do not resuscitate: Secondary | ICD-10-CM | POA: Diagnosis not present

## 2023-08-19 DIAGNOSIS — E8721 Acute metabolic acidosis: Secondary | ICD-10-CM | POA: Diagnosis present

## 2023-08-19 DIAGNOSIS — E1165 Type 2 diabetes mellitus with hyperglycemia: Secondary | ICD-10-CM | POA: Diagnosis present

## 2023-08-19 DIAGNOSIS — I130 Hypertensive heart and chronic kidney disease with heart failure and stage 1 through stage 4 chronic kidney disease, or unspecified chronic kidney disease: Secondary | ICD-10-CM | POA: Diagnosis present

## 2023-08-19 DIAGNOSIS — Z86718 Personal history of other venous thrombosis and embolism: Secondary | ICD-10-CM

## 2023-08-19 DIAGNOSIS — N1831 Chronic kidney disease, stage 3a: Secondary | ICD-10-CM | POA: Diagnosis present

## 2023-08-19 DIAGNOSIS — J449 Chronic obstructive pulmonary disease, unspecified: Secondary | ICD-10-CM | POA: Diagnosis present

## 2023-08-19 DIAGNOSIS — Z515 Encounter for palliative care: Secondary | ICD-10-CM | POA: Diagnosis not present

## 2023-08-19 DIAGNOSIS — H53462 Homonymous bilateral field defects, left side: Secondary | ICD-10-CM | POA: Diagnosis present

## 2023-08-19 DIAGNOSIS — I4719 Other supraventricular tachycardia: Secondary | ICD-10-CM | POA: Diagnosis not present

## 2023-08-19 DIAGNOSIS — K118 Other diseases of salivary glands: Secondary | ICD-10-CM | POA: Diagnosis present

## 2023-08-19 DIAGNOSIS — E1122 Type 2 diabetes mellitus with diabetic chronic kidney disease: Secondary | ICD-10-CM | POA: Diagnosis present

## 2023-08-19 DIAGNOSIS — E785 Hyperlipidemia, unspecified: Secondary | ICD-10-CM | POA: Diagnosis present

## 2023-08-19 DIAGNOSIS — I6601 Occlusion and stenosis of right middle cerebral artery: Secondary | ICD-10-CM | POA: Diagnosis not present

## 2023-08-19 DIAGNOSIS — Z6841 Body Mass Index (BMI) 40.0 and over, adult: Secondary | ICD-10-CM

## 2023-08-19 DIAGNOSIS — Z79899 Other long term (current) drug therapy: Secondary | ICD-10-CM

## 2023-08-19 DIAGNOSIS — J690 Pneumonitis due to inhalation of food and vomit: Secondary | ICD-10-CM | POA: Diagnosis not present

## 2023-08-19 DIAGNOSIS — Z8616 Personal history of COVID-19: Secondary | ICD-10-CM | POA: Diagnosis not present

## 2023-08-19 DIAGNOSIS — I611 Nontraumatic intracerebral hemorrhage in hemisphere, cortical: Secondary | ICD-10-CM | POA: Diagnosis not present

## 2023-08-19 DIAGNOSIS — Z833 Family history of diabetes mellitus: Secondary | ICD-10-CM

## 2023-08-19 DIAGNOSIS — Z560 Unemployment, unspecified: Secondary | ICD-10-CM

## 2023-08-19 DIAGNOSIS — E875 Hyperkalemia: Secondary | ICD-10-CM | POA: Diagnosis present

## 2023-08-19 DIAGNOSIS — Z7951 Long term (current) use of inhaled steroids: Secondary | ICD-10-CM

## 2023-08-19 DIAGNOSIS — R29718 NIHSS score 18: Secondary | ICD-10-CM | POA: Diagnosis present

## 2023-08-19 DIAGNOSIS — J9601 Acute respiratory failure with hypoxia: Secondary | ICD-10-CM | POA: Diagnosis present

## 2023-08-19 DIAGNOSIS — R1310 Dysphagia, unspecified: Secondary | ICD-10-CM | POA: Diagnosis present

## 2023-08-19 DIAGNOSIS — R0609 Other forms of dyspnea: Secondary | ICD-10-CM | POA: Diagnosis not present

## 2023-08-19 DIAGNOSIS — N179 Acute kidney failure, unspecified: Secondary | ICD-10-CM | POA: Diagnosis not present

## 2023-08-19 DIAGNOSIS — Z8249 Family history of ischemic heart disease and other diseases of the circulatory system: Secondary | ICD-10-CM

## 2023-08-19 DIAGNOSIS — D539 Nutritional anemia, unspecified: Secondary | ICD-10-CM | POA: Diagnosis present

## 2023-08-19 DIAGNOSIS — I63511 Cerebral infarction due to unspecified occlusion or stenosis of right middle cerebral artery: Secondary | ICD-10-CM

## 2023-08-19 DIAGNOSIS — Z87442 Personal history of urinary calculi: Secondary | ICD-10-CM

## 2023-08-19 DIAGNOSIS — R29810 Facial weakness: Secondary | ICD-10-CM | POA: Diagnosis present

## 2023-08-19 DIAGNOSIS — Z794 Long term (current) use of insulin: Secondary | ICD-10-CM

## 2023-08-19 DIAGNOSIS — J9621 Acute and chronic respiratory failure with hypoxia: Secondary | ICD-10-CM | POA: Diagnosis not present

## 2023-08-19 DIAGNOSIS — R34 Anuria and oliguria: Secondary | ICD-10-CM | POA: Diagnosis not present

## 2023-08-19 DIAGNOSIS — K219 Gastro-esophageal reflux disease without esophagitis: Secondary | ICD-10-CM | POA: Diagnosis present

## 2023-08-19 DIAGNOSIS — T17908A Unspecified foreign body in respiratory tract, part unspecified causing other injury, initial encounter: Secondary | ICD-10-CM | POA: Diagnosis not present

## 2023-08-19 DIAGNOSIS — I959 Hypotension, unspecified: Secondary | ICD-10-CM | POA: Diagnosis not present

## 2023-08-19 DIAGNOSIS — F17210 Nicotine dependence, cigarettes, uncomplicated: Secondary | ICD-10-CM | POA: Diagnosis present

## 2023-08-19 DIAGNOSIS — Z90710 Acquired absence of both cervix and uterus: Secondary | ICD-10-CM

## 2023-08-19 HISTORY — PX: IR PERCUTANEOUS ART THROMBECTOMY/INFUSION INTRACRANIAL INC DIAG ANGIO: IMG6087

## 2023-08-19 HISTORY — PX: IR CT HEAD LTD: IMG2386

## 2023-08-19 HISTORY — PX: RADIOLOGY WITH ANESTHESIA: SHX6223

## 2023-08-19 LAB — URINALYSIS, ROUTINE W REFLEX MICROSCOPIC
Bacteria, UA: NONE SEEN
Bilirubin Urine: NEGATIVE
Glucose, UA: NEGATIVE mg/dL
Ketones, ur: NEGATIVE mg/dL
Leukocytes,Ua: NEGATIVE
Nitrite: NEGATIVE
Protein, ur: 30 mg/dL — AB
Specific Gravity, Urine: 1.034 — ABNORMAL HIGH (ref 1.005–1.030)
pH: 5 (ref 5.0–8.0)

## 2023-08-19 LAB — CBC
HCT: 39.2 % (ref 36.0–46.0)
Hemoglobin: 12.6 g/dL (ref 12.0–15.0)
MCH: 32.8 pg (ref 26.0–34.0)
MCHC: 32.1 g/dL (ref 30.0–36.0)
MCV: 102.1 fL — ABNORMAL HIGH (ref 80.0–100.0)
Platelets: 203 10*3/uL (ref 150–400)
RBC: 3.84 MIL/uL — ABNORMAL LOW (ref 3.87–5.11)
RDW: 14.6 % (ref 11.5–15.5)
WBC: 9.2 10*3/uL (ref 4.0–10.5)
nRBC: 0 % (ref 0.0–0.2)

## 2023-08-19 LAB — I-STAT CHEM 8, ED
BUN: 12 mg/dL (ref 8–23)
Calcium, Ion: 1.16 mmol/L (ref 1.15–1.40)
Chloride: 106 mmol/L (ref 98–111)
Creatinine, Ser: 1.1 mg/dL — ABNORMAL HIGH (ref 0.44–1.00)
Glucose, Bld: 82 mg/dL (ref 70–99)
HCT: 43 % (ref 36.0–46.0)
Hemoglobin: 14.6 g/dL (ref 12.0–15.0)
Potassium: 4.1 mmol/L (ref 3.5–5.1)
Sodium: 141 mmol/L (ref 135–145)
TCO2: 24 mmol/L (ref 22–32)

## 2023-08-19 LAB — POCT I-STAT 7, (LYTES, BLD GAS, ICA,H+H)
Acid-base deficit: 5 mmol/L — ABNORMAL HIGH (ref 0.0–2.0)
Acid-base deficit: 6 mmol/L — ABNORMAL HIGH (ref 0.0–2.0)
Acid-base deficit: 5 mmol/L — ABNORMAL HIGH (ref 0.0–2.0)
Bicarbonate: 24.5 mmol/L (ref 20.0–28.0)
Bicarbonate: 22.4 mmol/L (ref 20.0–28.0)
Bicarbonate: 22.3 mmol/L (ref 20.0–28.0)
Calcium, Ion: 1.16 mmol/L (ref 1.15–1.40)
Calcium, Ion: 1.11 mmol/L — ABNORMAL LOW (ref 1.15–1.40)
Calcium, Ion: 1.06 mmol/L — ABNORMAL LOW (ref 1.15–1.40)
HCT: 40 % (ref 36.0–46.0)
HCT: 38 % (ref 36.0–46.0)
HCT: 38 % (ref 36.0–46.0)
Hemoglobin: 13.6 g/dL (ref 12.0–15.0)
Hemoglobin: 12.9 g/dL (ref 12.0–15.0)
Hemoglobin: 12.9 g/dL (ref 12.0–15.0)
O2 Saturation: 84 %
O2 Saturation: 89 %
O2 Saturation: 96 %
Patient temperature: 98.7
Patient temperature: 34.3
Patient temperature: 36.4
Potassium: 3.8 mmol/L (ref 3.5–5.1)
Potassium: 3.8 mmol/L (ref 3.5–5.1)
Potassium: 4.6 mmol/L (ref 3.5–5.1)
Sodium: 139 mmol/L (ref 135–145)
Sodium: 140 mmol/L (ref 135–145)
Sodium: 136 mmol/L (ref 135–145)
TCO2: 27 mmol/L (ref 22–32)
TCO2: 24 mmol/L (ref 22–32)
TCO2: 24 mmol/L (ref 22–32)
pCO2 arterial: 67.6 mm[Hg] (ref 32–48)
pCO2 arterial: 50.5 mm[Hg] — ABNORMAL HIGH (ref 32–48)
pCO2 arterial: 50.4 mm[Hg] — ABNORMAL HIGH (ref 32–48)
pH, Arterial: 7.167 — CL (ref 7.35–7.45)
pH, Arterial: 7.239 — ABNORMAL LOW (ref 7.35–7.45)
pH, Arterial: 7.25 — ABNORMAL LOW (ref 7.35–7.45)
pO2, Arterial: 63 mm[Hg] — ABNORMAL LOW (ref 83–108)
pO2, Arterial: 59 mm[Hg] — ABNORMAL LOW (ref 83–108)
pO2, Arterial: 90 mm[Hg] (ref 83–108)

## 2023-08-19 LAB — RAPID URINE DRUG SCREEN, HOSP PERFORMED
Amphetamines: NOT DETECTED
Barbiturates: NOT DETECTED
Benzodiazepines: NOT DETECTED
Cocaine: NOT DETECTED
Opiates: NOT DETECTED
Tetrahydrocannabinol: NOT DETECTED

## 2023-08-19 LAB — LIPID PANEL
Cholesterol: 128 mg/dL (ref 0–200)
HDL: 34 mg/dL — ABNORMAL LOW (ref >40)
LDL Cholesterol: UNDETERMINED mg/dL (ref 0–99)
Total CHOL/HDL Ratio: 3.8 {ratio}
Triglycerides: 416 mg/dL — ABNORMAL HIGH (ref <150)
VLDL: UNDETERMINED mg/dL (ref 0–40)

## 2023-08-19 LAB — ECHOCARDIOGRAM COMPLETE
AR max vel: 1.8 cm2
AV Area VTI: 1.96 cm2
AV Area mean vel: 1.8 cm2
AV Mean grad: 9 mm[Hg]
AV Peak grad: 17.8 mm[Hg]
Ao pk vel: 2.11 m/s
Area-P 1/2: 2.37 cm2
Height: 66 in
MV VTI: 1.37 cm2
S' Lateral: 3 cm
Weight: 4014.14 [oz_av]

## 2023-08-19 LAB — DIFFERENTIAL
Abs Immature Granulocytes: 0.1 10*3/uL — ABNORMAL HIGH (ref 0.00–0.07)
Basophils Absolute: 0 10*3/uL (ref 0.0–0.1)
Basophils Relative: 0 %
Eosinophils Absolute: 0.1 10*3/uL (ref 0.0–0.5)
Eosinophils Relative: 1 %
Immature Granulocytes: 1 %
Lymphocytes Relative: 16 %
Lymphs Abs: 1.4 10*3/uL (ref 0.7–4.0)
Monocytes Absolute: 0.6 10*3/uL (ref 0.1–1.0)
Monocytes Relative: 7 %
Neutro Abs: 6.9 10*3/uL (ref 1.7–7.7)
Neutrophils Relative %: 75 %

## 2023-08-19 LAB — HEMOGLOBIN A1C
Hgb A1c MFr Bld: 7.5 % — ABNORMAL HIGH (ref 4.8–5.6)
Mean Plasma Glucose: 168.55 mg/dL

## 2023-08-19 LAB — COMPREHENSIVE METABOLIC PANEL
ALT: 24 U/L (ref 0–44)
AST: 33 U/L (ref 15–41)
Albumin: 2.8 g/dL — ABNORMAL LOW (ref 3.5–5.0)
Alkaline Phosphatase: 85 U/L (ref 38–126)
Anion gap: 10 (ref 5–15)
BUN: 9 mg/dL (ref 8–23)
CO2: 22 mmol/L (ref 22–32)
Calcium: 8.1 mg/dL — ABNORMAL LOW (ref 8.9–10.3)
Chloride: 104 mmol/L (ref 98–111)
Creatinine, Ser: 1.17 mg/dL — ABNORMAL HIGH (ref 0.44–1.00)
GFR, Estimated: 53 mL/min — ABNORMAL LOW (ref >60)
Glucose, Bld: 104 mg/dL — ABNORMAL HIGH (ref 70–99)
Potassium: 3.6 mmol/L (ref 3.5–5.1)
Sodium: 136 mmol/L (ref 135–145)
Total Bilirubin: 0.7 mg/dL (ref 0.3–1.2)
Total Protein: 8.5 g/dL — ABNORMAL HIGH (ref 6.5–8.1)

## 2023-08-19 LAB — PROTIME-INR
INR: 1.1 (ref 0.8–1.2)
Prothrombin Time: 14.1 s (ref 11.4–15.2)

## 2023-08-19 LAB — MAGNESIUM: Magnesium: 1.6 mg/dL — ABNORMAL LOW (ref 1.7–2.4)

## 2023-08-19 LAB — HIV ANTIBODY (ROUTINE TESTING W REFLEX): HIV Screen 4th Generation wRfx: NONREACTIVE

## 2023-08-19 LAB — GLUCOSE, CAPILLARY
Glucose-Capillary: 159 mg/dL — ABNORMAL HIGH (ref 70–99)
Glucose-Capillary: 215 mg/dL — ABNORMAL HIGH (ref 70–99)
Glucose-Capillary: 235 mg/dL — ABNORMAL HIGH (ref 70–99)
Glucose-Capillary: 281 mg/dL — ABNORMAL HIGH (ref 70–99)
Glucose-Capillary: 357 mg/dL — ABNORMAL HIGH (ref 70–99)

## 2023-08-19 LAB — LDL CHOLESTEROL, DIRECT: Direct LDL: 59 mg/dL (ref 0–99)

## 2023-08-19 LAB — SARS CORONAVIRUS 2 BY RT PCR: SARS Coronavirus 2 by RT PCR: NEGATIVE

## 2023-08-19 LAB — APTT: aPTT: 26 s (ref 24–36)

## 2023-08-19 LAB — ETHANOL: Alcohol, Ethyl (B): <10 mg/dL (ref <10)

## 2023-08-19 LAB — PHOSPHORUS: Phosphorus: 3.5 mg/dL (ref 2.5–4.6)

## 2023-08-19 LAB — MRSA NEXT GEN BY PCR, NASAL: MRSA by PCR Next Gen: NOT DETECTED

## 2023-08-19 SURGERY — IR WITH ANESTHESIA
Anesthesia: General

## 2023-08-19 MED ORDER — METHYLPREDNISOLONE SODIUM SUCC 125 MG IJ SOLR
125.0000 mg | Freq: Once | INTRAMUSCULAR | Status: AC
  Administered 2023-08-19: 125 mg via INTRAVENOUS
  Filled 2023-08-19: qty 2

## 2023-08-19 MED ORDER — ORAL CARE MOUTH RINSE
15.0000 mL | OROMUCOSAL | Status: DC
  Administered 2023-08-19 – 2023-08-24 (×60): 15 mL via OROMUCOSAL

## 2023-08-19 MED ORDER — PROPOFOL 1000 MG/100ML IV EMUL
0.0000 ug/kg/min | INTRAVENOUS | Status: DC
  Administered 2023-08-19: 30 ug/kg/min via INTRAVENOUS
  Administered 2023-08-19: 10 ug/kg/min via INTRAVENOUS
  Administered 2023-08-19: 20 ug/kg/min via INTRAVENOUS
  Administered 2023-08-20: 40 ug/kg/min via INTRAVENOUS
  Administered 2023-08-20: 50 ug/kg/min via INTRAVENOUS
  Administered 2023-08-20: 40 ug/kg/min via INTRAVENOUS
  Administered 2023-08-20 (×2): 50 ug/kg/min via INTRAVENOUS
  Administered 2023-08-21 (×3): 40 ug/kg/min via INTRAVENOUS
  Administered 2023-08-21 (×2): 50 ug/kg/min via INTRAVENOUS
  Administered 2023-08-22: 10 ug/kg/min via INTRAVENOUS
  Administered 2023-08-22 (×2): 30 ug/kg/min via INTRAVENOUS
  Administered 2023-08-22 – 2023-08-23 (×2): 20 ug/kg/min via INTRAVENOUS
  Administered 2023-08-23: 30 ug/kg/min via INTRAVENOUS
  Filled 2023-08-19 (×21): qty 100

## 2023-08-19 MED ORDER — IOHEXOL 350 MG/ML SOLN
75.0000 mL | Freq: Once | INTRAVENOUS | Status: AC | PRN
  Administered 2023-08-19: 75 mL via INTRAVENOUS

## 2023-08-19 MED ORDER — CLEVIDIPINE BUTYRATE 0.5 MG/ML IV EMUL
0.0000 mg/h | INTRAVENOUS | Status: AC
  Administered 2023-08-19: 2 mg/h via INTRAVENOUS
  Filled 2023-08-19: qty 50

## 2023-08-19 MED ORDER — SODIUM CHLORIDE 0.9 % IV SOLN
3.0000 g | Freq: Four times a day (QID) | INTRAVENOUS | Status: DC
  Administered 2023-08-19 – 2023-08-22 (×11): 3 g via INTRAVENOUS
  Filled 2023-08-19 (×12): qty 8

## 2023-08-19 MED ORDER — NITROGLYCERIN 1 MG/10 ML FOR IR/CATH LAB
INTRA_ARTERIAL | Status: AC
  Filled 2023-08-19: qty 10

## 2023-08-19 MED ORDER — LACTATED RINGERS IV BOLUS
1000.0000 mL | Freq: Once | INTRAVENOUS | Status: AC
  Administered 2023-08-19: 1000 mL via INTRAVENOUS

## 2023-08-19 MED ORDER — SODIUM CHLORIDE 0.9 % IV SOLN: INTRAVENOUS | Status: DC

## 2023-08-19 MED ORDER — SUCCINYLCHOLINE CHLORIDE 200 MG/10ML IV SOSY
PREFILLED_SYRINGE | INTRAVENOUS | Status: AC
  Filled 2023-08-19: qty 10

## 2023-08-19 MED ORDER — POLYETHYLENE GLYCOL 3350 17 G PO PACK
17.0000 g | PACK | Freq: Every day | ORAL | Status: DC
  Administered 2023-08-20: 17 g
  Filled 2023-08-19 (×2): qty 1

## 2023-08-19 MED ORDER — IOHEXOL 300 MG/ML  SOLN
150.0000 mL | Freq: Once | INTRAMUSCULAR | Status: AC | PRN
  Administered 2023-08-19: 50 mL via INTRA_ARTERIAL

## 2023-08-19 MED ORDER — FENTANYL CITRATE PF 50 MCG/ML IJ SOSY
50.0000 ug | PREFILLED_SYRINGE | INTRAMUSCULAR | Status: DC | PRN
  Administered 2023-08-19 (×2): 50 ug via INTRAVENOUS
  Filled 2023-08-19 (×3): qty 1

## 2023-08-19 MED ORDER — ATORVASTATIN CALCIUM 10 MG PO TABS
20.0000 mg | ORAL_TABLET | Freq: Every day | ORAL | Status: DC
  Filled 2023-08-19: qty 2

## 2023-08-19 MED ORDER — ROCURONIUM BROMIDE 100 MG/10ML IV SOLN
INTRAVENOUS | Status: DC | PRN
  Administered 2023-08-19: 50 mg via INTRAVENOUS
  Administered 2023-08-19: 100 mg via INTRAVENOUS

## 2023-08-19 MED ORDER — ACETAMINOPHEN 325 MG PO TABS: 650.0000 mg | ORAL_TABLET | ORAL | Status: DC | PRN

## 2023-08-19 MED ORDER — DOCUSATE SODIUM 50 MG/5ML PO LIQD
100.0000 mg | Freq: Two times a day (BID) | ORAL | Status: DC
  Administered 2023-08-20 (×2): 100 mg
  Filled 2023-08-19 (×4): qty 10

## 2023-08-19 MED ORDER — PROSOURCE TF20 ENFIT COMPATIBL EN LIQD
60.0000 mL | Freq: Every day | ENTERAL | Status: DC
  Administered 2023-08-19 – 2023-08-24 (×6): 60 mL
  Filled 2023-08-19 (×6): qty 60

## 2023-08-19 MED ORDER — ACETAMINOPHEN 160 MG/5ML PO SOLN
650.0000 mg | ORAL | Status: DC | PRN
  Administered 2023-08-23 (×2): 650 mg
  Filled 2023-08-19 (×2): qty 20.3

## 2023-08-19 MED ORDER — ETOMIDATE 2 MG/ML IV SOLN
20.0000 mg | Freq: Once | INTRAVENOUS | Status: AC
  Administered 2023-08-19: 20 mg via INTRAVENOUS

## 2023-08-19 MED ORDER — PHENYLEPHRINE HCL-NACL 20-0.9 MG/250ML-% IV SOLN
INTRAVENOUS | Status: DC | PRN
Start: 2023-08-19 — End: 2023-08-19
  Administered 2023-08-19: 40 ug/min via INTRAVENOUS

## 2023-08-19 MED ORDER — ENOXAPARIN SODIUM 40 MG/0.4ML IJ SOSY: 40.0000 mg | PREFILLED_SYRINGE | INTRAMUSCULAR | Status: DC

## 2023-08-19 MED ORDER — ASPIRIN 325 MG PO TABS
ORAL_TABLET | ORAL | Status: AC
  Filled 2023-08-19: qty 1

## 2023-08-19 MED ORDER — ROCURONIUM BROMIDE 10 MG/ML (PF) SYRINGE
100.0000 mg | PREFILLED_SYRINGE | Freq: Once | INTRAVENOUS | Status: AC
  Administered 2023-08-19: 100 mg via INTRAVENOUS

## 2023-08-19 MED ORDER — PHENYLEPHRINE HCL (PRESSORS) 10 MG/ML IV SOLN
INTRAVENOUS | Status: DC | PRN
  Administered 2023-08-19 (×2): 160 ug via INTRAVENOUS

## 2023-08-19 MED ORDER — NOREPINEPHRINE 4 MG/250ML-% IV SOLN
2.0000 ug/min | INTRAVENOUS | Status: DC
  Administered 2023-08-19: 2 ug/min via INTRAVENOUS
  Administered 2023-08-19: 9 ug/min via INTRAVENOUS
  Filled 2023-08-19 (×2): qty 250

## 2023-08-19 MED ORDER — ALBUMIN HUMAN 25 % IV SOLN
25.0000 g | Freq: Four times a day (QID) | INTRAVENOUS | Status: AC
  Administered 2023-08-19 – 2023-08-20 (×4): 25 g via INTRAVENOUS
  Filled 2023-08-19 (×4): qty 100

## 2023-08-19 MED ORDER — IPRATROPIUM-ALBUTEROL 0.5-2.5 (3) MG/3ML IN SOLN
3.0000 mL | Freq: Four times a day (QID) | RESPIRATORY_TRACT | Status: DC | PRN
  Administered 2023-08-19 – 2023-08-22 (×3): 3 mL via RESPIRATORY_TRACT
  Filled 2023-08-19 (×3): qty 3

## 2023-08-19 MED ORDER — INSULIN GLARGINE-YFGN 100 UNIT/ML ~~LOC~~ SOLN
10.0000 [IU] | Freq: Every day | SUBCUTANEOUS | Status: DC
  Administered 2023-08-19 – 2023-08-22 (×4): 10 [IU] via SUBCUTANEOUS
  Filled 2023-08-19 (×4): qty 0.1

## 2023-08-19 MED ORDER — INSULIN ASPART 100 UNIT/ML IJ SOLN
0.0000 [IU] | INTRAMUSCULAR | Status: DC
  Administered 2023-08-19: 4 [IU] via SUBCUTANEOUS
  Administered 2023-08-19: 11 [IU] via SUBCUTANEOUS
  Administered 2023-08-19: 20 [IU] via SUBCUTANEOUS
  Administered 2023-08-19 – 2023-08-20 (×2): 7 [IU] via SUBCUTANEOUS
  Administered 2023-08-20 (×2): 4 [IU] via SUBCUTANEOUS
  Administered 2023-08-20: 7 [IU] via SUBCUTANEOUS
  Administered 2023-08-20: 4 [IU] via SUBCUTANEOUS
  Administered 2023-08-20 – 2023-08-21 (×2): 7 [IU] via SUBCUTANEOUS
  Administered 2023-08-21: 4 [IU] via SUBCUTANEOUS
  Administered 2023-08-21 (×2): 7 [IU] via SUBCUTANEOUS
  Administered 2023-08-21 (×2): 11 [IU] via SUBCUTANEOUS
  Administered 2023-08-22: 7 [IU] via SUBCUTANEOUS
  Administered 2023-08-22: 11 [IU] via SUBCUTANEOUS
  Administered 2023-08-22 (×3): 7 [IU] via SUBCUTANEOUS
  Administered 2023-08-22 – 2023-08-23 (×2): 11 [IU] via SUBCUTANEOUS
  Administered 2023-08-23: 7 [IU] via SUBCUTANEOUS
  Administered 2023-08-23: 11 [IU] via SUBCUTANEOUS
  Administered 2023-08-23: 7 [IU] via SUBCUTANEOUS
  Administered 2023-08-23: 11 [IU] via SUBCUTANEOUS
  Administered 2023-08-23: 7 [IU] via SUBCUTANEOUS
  Administered 2023-08-24: 4 [IU] via SUBCUTANEOUS
  Administered 2023-08-24 (×2): 11 [IU] via SUBCUTANEOUS
  Administered 2023-08-24: 4 [IU] via SUBCUTANEOUS

## 2023-08-19 MED ORDER — MIDAZOLAM HCL 2 MG/2ML IJ SOLN
INTRAMUSCULAR | Status: AC
  Filled 2023-08-19: qty 2

## 2023-08-19 MED ORDER — DEXMEDETOMIDINE HCL IN NACL 400 MCG/100ML IV SOLN
0.0000 ug/kg/h | INTRAVENOUS | Status: DC
  Administered 2023-08-19: 0.5 ug/kg/h via INTRAVENOUS
  Filled 2023-08-19: qty 100

## 2023-08-19 MED ORDER — ATORVASTATIN CALCIUM 10 MG PO TABS
20.0000 mg | ORAL_TABLET | Freq: Every day | ORAL | Status: DC
  Administered 2023-08-19 – 2023-08-24 (×6): 20 mg
  Filled 2023-08-19 (×5): qty 2

## 2023-08-19 MED ORDER — FAMOTIDINE 20 MG PO TABS
20.0000 mg | ORAL_TABLET | Freq: Two times a day (BID) | ORAL | Status: DC
  Filled 2023-08-19: qty 1

## 2023-08-19 MED ORDER — PANTOPRAZOLE SODIUM 40 MG PO TBEC
40.0000 mg | DELAYED_RELEASE_TABLET | Freq: Every day | ORAL | Status: DC
  Filled 2023-08-19: qty 1

## 2023-08-19 MED ORDER — ACETAMINOPHEN 650 MG RE SUPP: 650.0000 mg | RECTAL | Status: DC | PRN

## 2023-08-19 MED ORDER — IOHEXOL 300 MG/ML  SOLN
50.0000 mL | Freq: Once | INTRAMUSCULAR | Status: AC | PRN
  Administered 2023-08-19: 35 mL via INTRA_ARTERIAL

## 2023-08-19 MED ORDER — LACTATED RINGERS IV SOLN: INTRAVENOUS | Status: DC | PRN

## 2023-08-19 MED ORDER — SODIUM CHLORIDE (PF) 0.9 % IJ SOLN
INTRAVENOUS | Status: AC | PRN
  Administered 2023-08-19: 100 ug via INTRA_ARTERIAL

## 2023-08-19 MED ORDER — PANTOPRAZOLE SODIUM 40 MG IV SOLR
40.0000 mg | Freq: Every day | INTRAVENOUS | Status: DC
  Administered 2023-08-19 – 2023-08-24 (×6): 40 mg via INTRAVENOUS
  Filled 2023-08-19 (×6): qty 10

## 2023-08-19 MED ORDER — ACETAMINOPHEN 160 MG/5ML PO SOLN: 650.0000 mg | ORAL | Status: DC | PRN

## 2023-08-19 MED ORDER — FENTANYL CITRATE PF 50 MCG/ML IJ SOSY
50.0000 ug | PREFILLED_SYRINGE | INTRAMUSCULAR | Status: DC | PRN
  Administered 2023-08-19 – 2023-08-20 (×8): 50 ug via INTRAVENOUS
  Filled 2023-08-19 (×9): qty 1

## 2023-08-19 MED ORDER — ETOMIDATE 2 MG/ML IV SOLN
INTRAVENOUS | Status: AC
  Filled 2023-08-19: qty 20

## 2023-08-19 MED ORDER — SENNOSIDES-DOCUSATE SODIUM 8.6-50 MG PO TABS: 1.0000 | ORAL_TABLET | Freq: Every evening | ORAL | Status: DC | PRN

## 2023-08-19 MED ORDER — STROKE: EARLY STAGES OF RECOVERY BOOK
Freq: Once | Status: AC
  Filled 2023-08-19: qty 1

## 2023-08-19 MED ORDER — TICAGRELOR 90 MG PO TABS
ORAL_TABLET | ORAL | Status: AC
  Filled 2023-08-19: qty 1

## 2023-08-19 MED ORDER — ORAL CARE MOUTH RINSE: 15.0000 mL | OROMUCOSAL | Status: DC | PRN

## 2023-08-19 MED ORDER — ROCURONIUM BROMIDE 10 MG/ML (PF) SYRINGE
PREFILLED_SYRINGE | INTRAVENOUS | Status: AC
  Filled 2023-08-19: qty 10

## 2023-08-19 MED ORDER — PROPOFOL 500 MG/50ML IV EMUL
INTRAVENOUS | Status: DC | PRN
Start: 2023-08-19 — End: 2023-08-19
  Administered 2023-08-19: 60 ug/kg/min via INTRAVENOUS

## 2023-08-19 MED ORDER — IPRATROPIUM-ALBUTEROL 0.5-2.5 (3) MG/3ML IN SOLN
3.0000 mL | Freq: Once | RESPIRATORY_TRACT | Status: AC
  Administered 2023-08-19: 3 mL via RESPIRATORY_TRACT
  Filled 2023-08-19: qty 3

## 2023-08-19 MED ORDER — FENTANYL CITRATE (PF) 100 MCG/2ML IJ SOLN
INTRAMUSCULAR | Status: AC
  Filled 2023-08-19: qty 2

## 2023-08-19 MED ORDER — CHLORHEXIDINE GLUCONATE CLOTH 2 % EX PADS
6.0000 | MEDICATED_PAD | Freq: Every day | CUTANEOUS | Status: DC
  Administered 2023-08-19 – 2023-08-24 (×6): 6 via TOPICAL

## 2023-08-19 MED ORDER — INSULIN ASPART 100 UNIT/ML IJ SOLN
5.0000 [IU] | INTRAMUSCULAR | Status: DC
  Administered 2023-08-19 – 2023-08-22 (×17): 5 [IU] via SUBCUTANEOUS

## 2023-08-19 MED ORDER — SODIUM CHLORIDE 0.9 % IV SOLN: 250.0000 mL | INTRAVENOUS | Status: DC

## 2023-08-19 MED ORDER — OSMOLITE 1.2 CAL PO LIQD
1000.0000 mL | ORAL | Status: DC
  Administered 2023-08-19 – 2023-08-23 (×4): 1000 mL

## 2023-08-19 MED ORDER — FENTANYL CITRATE PF 50 MCG/ML IJ SOSY
PREFILLED_SYRINGE | INTRAMUSCULAR | Status: AC
  Filled 2023-08-19: qty 2

## 2023-08-19 MED ORDER — SODIUM CHLORIDE 0.9 % IV SOLN
1.5000 g | Freq: Four times a day (QID) | INTRAVENOUS | Status: DC
  Administered 2023-08-19: 1.5 g via INTRAVENOUS
  Filled 2023-08-19 (×5): qty 4

## 2023-08-19 MED ORDER — FENTANYL CITRATE (PF) 100 MCG/2ML IJ SOLN
INTRAMUSCULAR | Status: DC | PRN
Start: 2023-08-19 — End: 2023-08-19
  Administered 2023-08-19: 100 ug via INTRAVENOUS

## 2023-08-19 NOTE — Progress Notes (Signed)
Pt was transported to MRI via ventilator and back with no apparent complications at this time.RT will monitor.

## 2023-08-19 NOTE — Sedation Documentation (Signed)
100 mg succinylcholine in

## 2023-08-19 NOTE — Progress Notes (Addendum)
Pharmacy Antibiotic Note  Jill Barnes is a 62 y.o. adult admitted on 09/10/2023 with  stroke .  Pharmacy has been consulted for ampicillin/sulbactam dosing for aspiration pneumonia.  Plan: Ampicillin-sulbactam 1.5g q6h for 5 days Monitor renal function Discussed allergy with team, will trial unasyn due to low severity of allergy and patient is intubated  Height: 5\' 6"  (167.6 cm) Weight: 113.8 kg (250 lb 14.1 oz) IBW/kg (Calculated) : 59.3  Temp (24hrs), Avg:93.9 F (34.4 C), Min:93.7 F (34.3 C), Max:94.1 F (34.5 C)  Recent Labs  Lab August 24, 2023 0328 09/01/2023 0357  WBC 9.2  --   CREATININE 1.17* 1.10*    Estimated Creatinine Clearance (by C-G formula based on SCr of 1.1 mg/dL (H)) Female: 84.6 mL/min (A) Female: 83.6 mL/min (A)    Allergies  Allergen Reactions   Sulfa Antibiotics Shortness Of Breath and Swelling    All-over swelling   Penicillins Rash   Microbiology results: 08/25/2023 MRSA PCR: negative  Thank you for allowing pharmacy to be a part of this patient's care.  Rutherford Nail, PharmD PGY2 Critical Care Pharmacy Resident Aug 24, 2023 12:04 PM  Addendum: will adj to 3g q6h for body habitus

## 2023-08-19 NOTE — ED Provider Notes (Signed)
gaze. Small left maxillary sinus retention cyst. Paranasal sinuses are otherwise largely clear. No mastoid effusion. Other: None. ASPECTS [facility] Stroke Program Early CT Score) - Ganglionic level infarction (caudate, lentiform nuclei, internal capsule, insula, M1-M3 cortex): 7 - Supraganglionic infarction (M4-M6 cortex): 2 Total score (0-10 with 10 being normal): 9 CTA NECK FINDINGS Aortic arch: Visualized arch within normal limits for caliber standard branch pattern. Mild atheromatous disease without hemodynamically significant stenosis. Right carotid system: Right common and internal carotid arteries are patent without dissection. Bulky calcified plaque about the right carotid bulb with approximate 30-40% stenosis by NASCET criteria. Left carotid system: Left common and internal carotid arteries are patent without dissection. Calcified plaque about the left carotid bulb with associated stenosis of up to 65-70% by NASCET criteria. Vertebral arteries: Both vertebral arteries arise from subclavian arteries. Vertebral arteries are  patent without stenosis or dissection. Skeleton: No discrete or worrisome osseous lesions. Other neck: No other acute finding. 1.1 cm nodular lesion present within the left parotid gland (series 7, image 97), indeterminate. Upper chest: Layering bilateral pleural effusions with pulmonary interstitial edema, consistent with CHF. Scattered mediastinal adenopathy measuring up to 1.7 cm, nonspecific, but could be reactive. Review of the MIP images confirms the above findings CTA HEAD FINDINGS Anterior circulation: Atheromatous change about the carotid siphons with moderate to severe narrowing on the left and more moderate narrowing on the right. A1 segments patent bilaterally. Left A1 hypoplastic. Normal anterior communicating artery complex. Anterior cerebral arteries patent. Left M1 segment and distal left MCA branches are patent and perfused. On the right, there is occlusion of the distal right M1 segment, new from prior, likely acute (series 5, image 102). Scant collateral flow seen distally within the right MCA distribution. Posterior circulation: Both V4 segments patent without significant stenosis. Right vertebral artery dominant. Both PICA patent. Basilar patent without stenosis. Superior cerebellar arteries patent bilaterally. Both PCAs supplied via the basilar. Atheromatous irregularity about the PCAs bilaterally without high-grade stenosis. Venous sinuses: Patent allowing for timing the contrast bolus. Anatomic variants: As above.  No aneurysm. Review of the MIP images confirms the above findings IMPRESSION: CT HEAD IMPRESSION: 1. Subtly decreased attenuation at the level of the right frontal operculum, suspicious for an evolving acute right MCA territory infarct. No intracranial hemorrhage. 2. Aspects is 9. 3. Underlying atrophy with chronic ischemic changes as above. CTA HEAD AND NECK: 1. Positive CTA for emergent large vessel occlusion, with occlusion of the distal right M1 segment. Scant collateral flow  distally within the right MCA distribution. 2. Atheromatous change about the carotid bifurcations with associated stenoses of up to 65-70% on the left and 30-40% on the right. 3. Atheromatous change about the carotid siphons with associated moderate to severe narrowing on the left and moderate narrowing on the right. 4. Layering bilateral pleural effusions with pulmonary interstitial edema, consistent with CHF. Scattered mediastinal adenopathy measuring up to 1.7 cm, nonspecific, but could be reactive. 5. 1.1 cm nodular lesion within the left parotid gland, indeterminate. Nonemergent outpatient ENT referral for further evaluation suggested. Critical Value/emergent results were discussed by telephone at the time of interpretation on 08/23/2023 at 3:41 am to provider Dr. Derry Lory, who verbally acknowledged these results. Electronically Signed   By: Rise Mu M.D.   On: 08/23/2023 04:22   CT ANGIO HEAD NECK W WO CM (CODE STROKE)  Result Date: 08/23/23 CLINICAL DATA:  Code stroke. Initial evaluation for neuro deficit, stroke suspected. EXAM: CT ANGIOGRAPHY HEAD AND NECK TECHNIQUE: Multidetector CT imaging of  gaze. Small left maxillary sinus retention cyst. Paranasal sinuses are otherwise largely clear. No mastoid effusion. Other: None. ASPECTS [facility] Stroke Program Early CT Score) - Ganglionic level infarction (caudate, lentiform nuclei, internal capsule, insula, M1-M3 cortex): 7 - Supraganglionic infarction (M4-M6 cortex): 2 Total score (0-10 with 10 being normal): 9 CTA NECK FINDINGS Aortic arch: Visualized arch within normal limits for caliber standard branch pattern. Mild atheromatous disease without hemodynamically significant stenosis. Right carotid system: Right common and internal carotid arteries are patent without dissection. Bulky calcified plaque about the right carotid bulb with approximate 30-40% stenosis by NASCET criteria. Left carotid system: Left common and internal carotid arteries are patent without dissection. Calcified plaque about the left carotid bulb with associated stenosis of up to 65-70% by NASCET criteria. Vertebral arteries: Both vertebral arteries arise from subclavian arteries. Vertebral arteries are  patent without stenosis or dissection. Skeleton: No discrete or worrisome osseous lesions. Other neck: No other acute finding. 1.1 cm nodular lesion present within the left parotid gland (series 7, image 97), indeterminate. Upper chest: Layering bilateral pleural effusions with pulmonary interstitial edema, consistent with CHF. Scattered mediastinal adenopathy measuring up to 1.7 cm, nonspecific, but could be reactive. Review of the MIP images confirms the above findings CTA HEAD FINDINGS Anterior circulation: Atheromatous change about the carotid siphons with moderate to severe narrowing on the left and more moderate narrowing on the right. A1 segments patent bilaterally. Left A1 hypoplastic. Normal anterior communicating artery complex. Anterior cerebral arteries patent. Left M1 segment and distal left MCA branches are patent and perfused. On the right, there is occlusion of the distal right M1 segment, new from prior, likely acute (series 5, image 102). Scant collateral flow seen distally within the right MCA distribution. Posterior circulation: Both V4 segments patent without significant stenosis. Right vertebral artery dominant. Both PICA patent. Basilar patent without stenosis. Superior cerebellar arteries patent bilaterally. Both PCAs supplied via the basilar. Atheromatous irregularity about the PCAs bilaterally without high-grade stenosis. Venous sinuses: Patent allowing for timing the contrast bolus. Anatomic variants: As above.  No aneurysm. Review of the MIP images confirms the above findings IMPRESSION: CT HEAD IMPRESSION: 1. Subtly decreased attenuation at the level of the right frontal operculum, suspicious for an evolving acute right MCA territory infarct. No intracranial hemorrhage. 2. Aspects is 9. 3. Underlying atrophy with chronic ischemic changes as above. CTA HEAD AND NECK: 1. Positive CTA for emergent large vessel occlusion, with occlusion of the distal right M1 segment. Scant collateral flow  distally within the right MCA distribution. 2. Atheromatous change about the carotid bifurcations with associated stenoses of up to 65-70% on the left and 30-40% on the right. 3. Atheromatous change about the carotid siphons with associated moderate to severe narrowing on the left and moderate narrowing on the right. 4. Layering bilateral pleural effusions with pulmonary interstitial edema, consistent with CHF. Scattered mediastinal adenopathy measuring up to 1.7 cm, nonspecific, but could be reactive. 5. 1.1 cm nodular lesion within the left parotid gland, indeterminate. Nonemergent outpatient ENT referral for further evaluation suggested. Critical Value/emergent results were discussed by telephone at the time of interpretation on 08/23/2023 at 3:41 am to provider Dr. Derry Lory, who verbally acknowledged these results. Electronically Signed   By: Rise Mu M.D.   On: 08/23/2023 04:22   CT ANGIO HEAD NECK W WO CM (CODE STROKE)  Result Date: 08/23/23 CLINICAL DATA:  Code stroke. Initial evaluation for neuro deficit, stroke suspected. EXAM: CT ANGIOGRAPHY HEAD AND NECK TECHNIQUE: Multidetector CT imaging of  gaze. Small left maxillary sinus retention cyst. Paranasal sinuses are otherwise largely clear. No mastoid effusion. Other: None. ASPECTS [facility] Stroke Program Early CT Score) - Ganglionic level infarction (caudate, lentiform nuclei, internal capsule, insula, M1-M3 cortex): 7 - Supraganglionic infarction (M4-M6 cortex): 2 Total score (0-10 with 10 being normal): 9 CTA NECK FINDINGS Aortic arch: Visualized arch within normal limits for caliber standard branch pattern. Mild atheromatous disease without hemodynamically significant stenosis. Right carotid system: Right common and internal carotid arteries are patent without dissection. Bulky calcified plaque about the right carotid bulb with approximate 30-40% stenosis by NASCET criteria. Left carotid system: Left common and internal carotid arteries are patent without dissection. Calcified plaque about the left carotid bulb with associated stenosis of up to 65-70% by NASCET criteria. Vertebral arteries: Both vertebral arteries arise from subclavian arteries. Vertebral arteries are  patent without stenosis or dissection. Skeleton: No discrete or worrisome osseous lesions. Other neck: No other acute finding. 1.1 cm nodular lesion present within the left parotid gland (series 7, image 97), indeterminate. Upper chest: Layering bilateral pleural effusions with pulmonary interstitial edema, consistent with CHF. Scattered mediastinal adenopathy measuring up to 1.7 cm, nonspecific, but could be reactive. Review of the MIP images confirms the above findings CTA HEAD FINDINGS Anterior circulation: Atheromatous change about the carotid siphons with moderate to severe narrowing on the left and more moderate narrowing on the right. A1 segments patent bilaterally. Left A1 hypoplastic. Normal anterior communicating artery complex. Anterior cerebral arteries patent. Left M1 segment and distal left MCA branches are patent and perfused. On the right, there is occlusion of the distal right M1 segment, new from prior, likely acute (series 5, image 102). Scant collateral flow seen distally within the right MCA distribution. Posterior circulation: Both V4 segments patent without significant stenosis. Right vertebral artery dominant. Both PICA patent. Basilar patent without stenosis. Superior cerebellar arteries patent bilaterally. Both PCAs supplied via the basilar. Atheromatous irregularity about the PCAs bilaterally without high-grade stenosis. Venous sinuses: Patent allowing for timing the contrast bolus. Anatomic variants: As above.  No aneurysm. Review of the MIP images confirms the above findings IMPRESSION: CT HEAD IMPRESSION: 1. Subtly decreased attenuation at the level of the right frontal operculum, suspicious for an evolving acute right MCA territory infarct. No intracranial hemorrhage. 2. Aspects is 9. 3. Underlying atrophy with chronic ischemic changes as above. CTA HEAD AND NECK: 1. Positive CTA for emergent large vessel occlusion, with occlusion of the distal right M1 segment. Scant collateral flow  distally within the right MCA distribution. 2. Atheromatous change about the carotid bifurcations with associated stenoses of up to 65-70% on the left and 30-40% on the right. 3. Atheromatous change about the carotid siphons with associated moderate to severe narrowing on the left and moderate narrowing on the right. 4. Layering bilateral pleural effusions with pulmonary interstitial edema, consistent with CHF. Scattered mediastinal adenopathy measuring up to 1.7 cm, nonspecific, but could be reactive. 5. 1.1 cm nodular lesion within the left parotid gland, indeterminate. Nonemergent outpatient ENT referral for further evaluation suggested. Critical Value/emergent results were discussed by telephone at the time of interpretation on 08/23/2023 at 3:41 am to provider Dr. Derry Lory, who verbally acknowledged these results. Electronically Signed   By: Rise Mu M.D.   On: 08/23/2023 04:22   CT ANGIO HEAD NECK W WO CM (CODE STROKE)  Result Date: 08/23/23 CLINICAL DATA:  Code stroke. Initial evaluation for neuro deficit, stroke suspected. EXAM: CT ANGIOGRAPHY HEAD AND NECK TECHNIQUE: Multidetector CT imaging of  gaze. Small left maxillary sinus retention cyst. Paranasal sinuses are otherwise largely clear. No mastoid effusion. Other: None. ASPECTS [facility] Stroke Program Early CT Score) - Ganglionic level infarction (caudate, lentiform nuclei, internal capsule, insula, M1-M3 cortex): 7 - Supraganglionic infarction (M4-M6 cortex): 2 Total score (0-10 with 10 being normal): 9 CTA NECK FINDINGS Aortic arch: Visualized arch within normal limits for caliber standard branch pattern. Mild atheromatous disease without hemodynamically significant stenosis. Right carotid system: Right common and internal carotid arteries are patent without dissection. Bulky calcified plaque about the right carotid bulb with approximate 30-40% stenosis by NASCET criteria. Left carotid system: Left common and internal carotid arteries are patent without dissection. Calcified plaque about the left carotid bulb with associated stenosis of up to 65-70% by NASCET criteria. Vertebral arteries: Both vertebral arteries arise from subclavian arteries. Vertebral arteries are  patent without stenosis or dissection. Skeleton: No discrete or worrisome osseous lesions. Other neck: No other acute finding. 1.1 cm nodular lesion present within the left parotid gland (series 7, image 97), indeterminate. Upper chest: Layering bilateral pleural effusions with pulmonary interstitial edema, consistent with CHF. Scattered mediastinal adenopathy measuring up to 1.7 cm, nonspecific, but could be reactive. Review of the MIP images confirms the above findings CTA HEAD FINDINGS Anterior circulation: Atheromatous change about the carotid siphons with moderate to severe narrowing on the left and more moderate narrowing on the right. A1 segments patent bilaterally. Left A1 hypoplastic. Normal anterior communicating artery complex. Anterior cerebral arteries patent. Left M1 segment and distal left MCA branches are patent and perfused. On the right, there is occlusion of the distal right M1 segment, new from prior, likely acute (series 5, image 102). Scant collateral flow seen distally within the right MCA distribution. Posterior circulation: Both V4 segments patent without significant stenosis. Right vertebral artery dominant. Both PICA patent. Basilar patent without stenosis. Superior cerebellar arteries patent bilaterally. Both PCAs supplied via the basilar. Atheromatous irregularity about the PCAs bilaterally without high-grade stenosis. Venous sinuses: Patent allowing for timing the contrast bolus. Anatomic variants: As above.  No aneurysm. Review of the MIP images confirms the above findings IMPRESSION: CT HEAD IMPRESSION: 1. Subtly decreased attenuation at the level of the right frontal operculum, suspicious for an evolving acute right MCA territory infarct. No intracranial hemorrhage. 2. Aspects is 9. 3. Underlying atrophy with chronic ischemic changes as above. CTA HEAD AND NECK: 1. Positive CTA for emergent large vessel occlusion, with occlusion of the distal right M1 segment. Scant collateral flow  distally within the right MCA distribution. 2. Atheromatous change about the carotid bifurcations with associated stenoses of up to 65-70% on the left and 30-40% on the right. 3. Atheromatous change about the carotid siphons with associated moderate to severe narrowing on the left and moderate narrowing on the right. 4. Layering bilateral pleural effusions with pulmonary interstitial edema, consistent with CHF. Scattered mediastinal adenopathy measuring up to 1.7 cm, nonspecific, but could be reactive. 5. 1.1 cm nodular lesion within the left parotid gland, indeterminate. Nonemergent outpatient ENT referral for further evaluation suggested. Critical Value/emergent results were discussed by telephone at the time of interpretation on 08/23/2023 at 3:41 am to provider Dr. Derry Lory, who verbally acknowledged these results. Electronically Signed   By: Rise Mu M.D.   On: 08/23/2023 04:22   CT ANGIO HEAD NECK W WO CM (CODE STROKE)  Result Date: 08/23/23 CLINICAL DATA:  Code stroke. Initial evaluation for neuro deficit, stroke suspected. EXAM: CT ANGIOGRAPHY HEAD AND NECK TECHNIQUE: Multidetector CT imaging of  chloride (PF) 0.9 % 59.72 mL (25 mcg/mL) syringe (100 mcg Intra-arterial Given 09/09/2023 0500)  iohexol (OMNIPAQUE) 350 MG/ML injection 75 mL (75 mLs Intravenous Contrast Given 09/06/2023 0341)  methylPREDNISolone sodium succinate (SOLU-MEDROL) 125 mg/2 mL injection 125 mg (125  mg Intravenous Given Aug 24, 2023 0354)  ipratropium-albuterol (DUONEB) 0.5-2.5 (3) MG/3ML nebulizer solution 3 mL (3 mLs Nebulization Given 09/07/2023 0444)  etomidate (AMIDATE) injection 20 mg (20 mg Intravenous Given 09/22/2023 0400)  rocuronium bromide 10 mg/mL (PF) syringe (100 mg Intravenous Given 08/26/2023 0400)  iohexol (OMNIPAQUE) 350 MG/ML injection 75 mL (75 mLs Intravenous Contrast Given 09/18/2023 0449)    ED Course/ Medical Decision Making/ A&P                                 Medical Decision Making Amount and/or Complexity of Data Reviewed Labs: ordered. Radiology: ordered and independent interpretation performed. ECG/medicine tests: ordered and independent interpretation performed.  Risk Prescription drug management. Decision regarding hospitalization.   62 year old female presenting to the ED as a code stroke.  LKW 2300 when she went to bed, woke up with dense left sided deficits, facial droop.  She is awake and alert on arrival but has total left-sided paralysis, right gaze preference.  She emergently went to CT, however when brought back to treatment room she was rapidly decompensating with sats in the 70s, dusky appearance, respiratory distress.  She did have some hypoxia with EMS but was still in the 90s on a couple liters, now on NRB 15L and only improving into the 80s.  CTA is positive for M1 occlusion.  Unfortunately she is out of the window for TNK, she is a candidate for IR.  Given her worsening respiratory status, elected to intubate for airway protection and for procedural management.  This was done without complication, tolerated well.  Post intubation still having some issues oxygenating, sats only up to around 91 to 92% on 100% O2, 10 PEEP.  Postintubation chest x-ray with what appears to be significant left-sided opacities.  Neurology at bedside post intubation.  We have discussed her current respiratory status despite intubation and adequate sedation.  Query potential risk  for PE causing thrombotic event today.  She does have a history of bilateral PEs as well as DVT but is no longer on anticoagulation.  She was sent for CTA chest with PE protocol prior to be taken to IR.  These results are pending by time patient taken to IR.  Neurology admitting for further management.  Final Clinical Impression(s) / ED Diagnoses Final diagnoses:  Cerebrovascular accident (CVA) due to embolism of left middle cerebral artery [facility])    Rx / DC Orders ED Discharge Orders     None         Garlon Hatchet, PA-C 09/22/2023 0554    Sabas Sous, MD Aug 24, 2023 [phone]  gaze. Small left maxillary sinus retention cyst. Paranasal sinuses are otherwise largely clear. No mastoid effusion. Other: None. ASPECTS [facility] Stroke Program Early CT Score) - Ganglionic level infarction (caudate, lentiform nuclei, internal capsule, insula, M1-M3 cortex): 7 - Supraganglionic infarction (M4-M6 cortex): 2 Total score (0-10 with 10 being normal): 9 CTA NECK FINDINGS Aortic arch: Visualized arch within normal limits for caliber standard branch pattern. Mild atheromatous disease without hemodynamically significant stenosis. Right carotid system: Right common and internal carotid arteries are patent without dissection. Bulky calcified plaque about the right carotid bulb with approximate 30-40% stenosis by NASCET criteria. Left carotid system: Left common and internal carotid arteries are patent without dissection. Calcified plaque about the left carotid bulb with associated stenosis of up to 65-70% by NASCET criteria. Vertebral arteries: Both vertebral arteries arise from subclavian arteries. Vertebral arteries are  patent without stenosis or dissection. Skeleton: No discrete or worrisome osseous lesions. Other neck: No other acute finding. 1.1 cm nodular lesion present within the left parotid gland (series 7, image 97), indeterminate. Upper chest: Layering bilateral pleural effusions with pulmonary interstitial edema, consistent with CHF. Scattered mediastinal adenopathy measuring up to 1.7 cm, nonspecific, but could be reactive. Review of the MIP images confirms the above findings CTA HEAD FINDINGS Anterior circulation: Atheromatous change about the carotid siphons with moderate to severe narrowing on the left and more moderate narrowing on the right. A1 segments patent bilaterally. Left A1 hypoplastic. Normal anterior communicating artery complex. Anterior cerebral arteries patent. Left M1 segment and distal left MCA branches are patent and perfused. On the right, there is occlusion of the distal right M1 segment, new from prior, likely acute (series 5, image 102). Scant collateral flow seen distally within the right MCA distribution. Posterior circulation: Both V4 segments patent without significant stenosis. Right vertebral artery dominant. Both PICA patent. Basilar patent without stenosis. Superior cerebellar arteries patent bilaterally. Both PCAs supplied via the basilar. Atheromatous irregularity about the PCAs bilaterally without high-grade stenosis. Venous sinuses: Patent allowing for timing the contrast bolus. Anatomic variants: As above.  No aneurysm. Review of the MIP images confirms the above findings IMPRESSION: CT HEAD IMPRESSION: 1. Subtly decreased attenuation at the level of the right frontal operculum, suspicious for an evolving acute right MCA territory infarct. No intracranial hemorrhage. 2. Aspects is 9. 3. Underlying atrophy with chronic ischemic changes as above. CTA HEAD AND NECK: 1. Positive CTA for emergent large vessel occlusion, with occlusion of the distal right M1 segment. Scant collateral flow  distally within the right MCA distribution. 2. Atheromatous change about the carotid bifurcations with associated stenoses of up to 65-70% on the left and 30-40% on the right. 3. Atheromatous change about the carotid siphons with associated moderate to severe narrowing on the left and moderate narrowing on the right. 4. Layering bilateral pleural effusions with pulmonary interstitial edema, consistent with CHF. Scattered mediastinal adenopathy measuring up to 1.7 cm, nonspecific, but could be reactive. 5. 1.1 cm nodular lesion within the left parotid gland, indeterminate. Nonemergent outpatient ENT referral for further evaluation suggested. Critical Value/emergent results were discussed by telephone at the time of interpretation on 08/23/2023 at 3:41 am to provider Dr. Derry Lory, who verbally acknowledged these results. Electronically Signed   By: Rise Mu M.D.   On: 08/23/2023 04:22   CT ANGIO HEAD NECK W WO CM (CODE STROKE)  Result Date: 08/23/23 CLINICAL DATA:  Code stroke. Initial evaluation for neuro deficit, stroke suspected. EXAM: CT ANGIOGRAPHY HEAD AND NECK TECHNIQUE: Multidetector CT imaging of

## 2023-08-19 NOTE — Progress Notes (Signed)
Echocardiogram 2D Echocardiogram has been performed.  Jill Barnes 08/22/2023, 3:25 PM

## 2023-08-19 NOTE — Progress Notes (Addendum)
presented with right M1 occlusion with left hemiplegia and right gaze deviation and underwent emergent successful mechanical thrombectomy but there was mild contrast extravasation in the sylvian fissure and MRI scan shows moderate size right MCA infarct with subarachnoid hemorrhage along with an old right MCA branch infarct as well.  Patient has a history of COPD and CHF and has high ventilatory needs at present.  Continue ongoing antibiotic support for respiratory failure and maintain hemodynamic support with strict control of blood pressure as per post thrombectomy protocol.  Consider weaning off ventilatory support in the next few days as tolerated.  Long discussion with the patient's son at the bedside and answered questions.  Prognosis is guarded.  Family needs to decide on goals of care including one-way extubation versus reintubation with trach and PEG and nursing home placement if she does not do well postextubation.  Discussed with critical care team.This patient is critically ill and at significant risk of neurological worsening, death and care requires constant monitoring of vital signs, hemodynamics,respiratory and cardiac monitoring, extensive review of multiple databases, frequent neurological assessment, discussion with family, other specialists and medical decision making of high complexity.I have made any additions or clarifications directly to the above note.This critical care time does not reflect procedure time, or teaching time or supervisory time of PA/NP/Med Resident etc but could involve care  discussion time.  I spent 30 minutes of neurocritical care time  in the care of  this patient.      Delia Heady, MD Medical Director [facility] Stroke Center Pager: [phone] 08/17/2023 4:38 PM  To contact Stroke Continuity provider, please refer to [email or web address]. After hours, contact General Neurology  iterative reconstruction technique. CONTRAST:  75mL OMNIPAQUE IOHEXOL 350 MG/ML SOLN COMPARISON:  CTA neck 0336 hours today. FINDINGS: Cardiovascular: Adequate contrast bolus timing in the pulmonary arterial tree. No central or saddle embolus. No convincing pulmonary artery filling defect, and extensive bilateral pulmonary airspace opacity is  partially enhancing throughout. Calcified coronary artery atherosclerosis. Negative thoracic aorta. No pericardial effusion. Heart size within normal limits. Mediastinum/Nodes: No mediastinal mass. Reactive appearing superior mediastinal lymph nodes similar to the earlier neck CTA. Lungs/Pleura: Intubated now, endotracheal tube tip in good position above the carina. Extensive new confluent bilateral upper lung airspace opacity since 0336 hours, left greater than right upper lobe consolidation. Left greater than right lower lobe consolidation. Mild bilateral middle lobe involvement. Trace superimposed layering pleural effusions. No pneumothorax. Upper Abdomen: Negative visible liver, spleen, stomach, adrenal glands. Musculoskeletal: No acute osseous abnormality identified. Review of the MIP images confirms the above findings. IMPRESSION: 1.  Negative for acute pulmonary embolus. 2. Positive for extensive bilateral pulmonary consolidation, greater in the left lung and new in the upper lungs compared to Neck CTA 0336 hours today. Favor Aspiration in this clinical setting. Trace superimposed pleural effusions. 3. Satisfactory endotracheal tube tip. 4. Reactive appearing mediastinal lymph nodes. 5. Calcified coronary artery atherosclerosis. Electronically Signed   By: Odessa Fleming M.D.   On: 07/26/2023 06:38   CT HEAD CODE STROKE WO CONTRAST  Result Date: 07/27/2023 CLINICAL DATA:  Code stroke. Initial evaluation for neuro deficit, stroke suspected. EXAM: CT ANGIOGRAPHY HEAD AND NECK TECHNIQUE: Multidetector CT imaging of the head and neck was performed using the standard protocol during bolus administration of intravenous contrast. Multiplanar CT image reconstructions and MIPs were obtained to evaluate the vascular anatomy. Carotid stenosis measurements (when applicable) are obtained utilizing NASCET criteria, using the distal internal carotid diameter as the denominator. RADIATION DOSE REDUCTION: This exam was performed  according to the departmental dose-optimization program which includes automated exposure control, adjustment of the mA and/or kV according to patient size and/or use of iterative reconstruction technique. CONTRAST:  Please see contrast active mentation. COMPARISON:  Prior study from 10/11/2020. FINDINGS: CT HEAD FINDINGS Brain: Atrophy with chronic microvascular ischemic disease. Encephalomalacia within the right cerebral hemisphere compatible with chronic right MCA distribution infarct. Chronic left PCA territory infarct. Multiple remote cerebellar infarcts. No acute intracranial hemorrhage. Subtly decreased attenuation at the level of the right frontal operculum suspicious for a superimposed evolving acute right MCA territory infarct (series 7, image 17). Basal ganglia maintained. No mass lesion or midline shift. No hydrocephalus or extra-axial fluid collection. Vascular: No visible hyperdense vessel. Skull: Scalp soft tissues and calvarium within normal limits. Sinuses/Orbits: Right gaze. Small left maxillary sinus retention cyst. Paranasal sinuses are otherwise largely clear. No mastoid effusion. Other: None. ASPECTS [facility] Stroke Program Early CT Score) - Ganglionic level infarction (caudate, lentiform nuclei, internal capsule, insula, M1-M3 cortex): 7 - Supraganglionic infarction (M4-M6 cortex): 2 Total score (0-10 with 10 being normal): 9 CTA NECK FINDINGS Aortic arch: Visualized arch within normal limits for caliber standard branch pattern. Mild atheromatous disease without hemodynamically significant stenosis. Right carotid system: Right common and internal carotid arteries are patent without dissection. Bulky calcified plaque about the right carotid bulb with approximate 30-40% stenosis by NASCET criteria. Left carotid system: Left common and internal carotid arteries are patent without dissection. Calcified plaque about the left carotid bulb with associated stenosis of up to 65-70% by NASCET criteria.  Vertebral arteries: Both vertebral arteries arise from subclavian arteries. Vertebral arteries are patent without stenosis  iterative reconstruction technique. CONTRAST:  75mL OMNIPAQUE IOHEXOL 350 MG/ML SOLN COMPARISON:  CTA neck 0336 hours today. FINDINGS: Cardiovascular: Adequate contrast bolus timing in the pulmonary arterial tree. No central or saddle embolus. No convincing pulmonary artery filling defect, and extensive bilateral pulmonary airspace opacity is  partially enhancing throughout. Calcified coronary artery atherosclerosis. Negative thoracic aorta. No pericardial effusion. Heart size within normal limits. Mediastinum/Nodes: No mediastinal mass. Reactive appearing superior mediastinal lymph nodes similar to the earlier neck CTA. Lungs/Pleura: Intubated now, endotracheal tube tip in good position above the carina. Extensive new confluent bilateral upper lung airspace opacity since 0336 hours, left greater than right upper lobe consolidation. Left greater than right lower lobe consolidation. Mild bilateral middle lobe involvement. Trace superimposed layering pleural effusions. No pneumothorax. Upper Abdomen: Negative visible liver, spleen, stomach, adrenal glands. Musculoskeletal: No acute osseous abnormality identified. Review of the MIP images confirms the above findings. IMPRESSION: 1.  Negative for acute pulmonary embolus. 2. Positive for extensive bilateral pulmonary consolidation, greater in the left lung and new in the upper lungs compared to Neck CTA 0336 hours today. Favor Aspiration in this clinical setting. Trace superimposed pleural effusions. 3. Satisfactory endotracheal tube tip. 4. Reactive appearing mediastinal lymph nodes. 5. Calcified coronary artery atherosclerosis. Electronically Signed   By: Odessa Fleming M.D.   On: 07/26/2023 06:38   CT HEAD CODE STROKE WO CONTRAST  Result Date: 07/27/2023 CLINICAL DATA:  Code stroke. Initial evaluation for neuro deficit, stroke suspected. EXAM: CT ANGIOGRAPHY HEAD AND NECK TECHNIQUE: Multidetector CT imaging of the head and neck was performed using the standard protocol during bolus administration of intravenous contrast. Multiplanar CT image reconstructions and MIPs were obtained to evaluate the vascular anatomy. Carotid stenosis measurements (when applicable) are obtained utilizing NASCET criteria, using the distal internal carotid diameter as the denominator. RADIATION DOSE REDUCTION: This exam was performed  according to the departmental dose-optimization program which includes automated exposure control, adjustment of the mA and/or kV according to patient size and/or use of iterative reconstruction technique. CONTRAST:  Please see contrast active mentation. COMPARISON:  Prior study from 10/11/2020. FINDINGS: CT HEAD FINDINGS Brain: Atrophy with chronic microvascular ischemic disease. Encephalomalacia within the right cerebral hemisphere compatible with chronic right MCA distribution infarct. Chronic left PCA territory infarct. Multiple remote cerebellar infarcts. No acute intracranial hemorrhage. Subtly decreased attenuation at the level of the right frontal operculum suspicious for a superimposed evolving acute right MCA territory infarct (series 7, image 17). Basal ganglia maintained. No mass lesion or midline shift. No hydrocephalus or extra-axial fluid collection. Vascular: No visible hyperdense vessel. Skull: Scalp soft tissues and calvarium within normal limits. Sinuses/Orbits: Right gaze. Small left maxillary sinus retention cyst. Paranasal sinuses are otherwise largely clear. No mastoid effusion. Other: None. ASPECTS [facility] Stroke Program Early CT Score) - Ganglionic level infarction (caudate, lentiform nuclei, internal capsule, insula, M1-M3 cortex): 7 - Supraganglionic infarction (M4-M6 cortex): 2 Total score (0-10 with 10 being normal): 9 CTA NECK FINDINGS Aortic arch: Visualized arch within normal limits for caliber standard branch pattern. Mild atheromatous disease without hemodynamically significant stenosis. Right carotid system: Right common and internal carotid arteries are patent without dissection. Bulky calcified plaque about the right carotid bulb with approximate 30-40% stenosis by NASCET criteria. Left carotid system: Left common and internal carotid arteries are patent without dissection. Calcified plaque about the left carotid bulb with associated stenosis of up to 65-70% by NASCET criteria.  Vertebral arteries: Both vertebral arteries arise from subclavian arteries. Vertebral arteries are patent without stenosis  iterative reconstruction technique. CONTRAST:  75mL OMNIPAQUE IOHEXOL 350 MG/ML SOLN COMPARISON:  CTA neck 0336 hours today. FINDINGS: Cardiovascular: Adequate contrast bolus timing in the pulmonary arterial tree. No central or saddle embolus. No convincing pulmonary artery filling defect, and extensive bilateral pulmonary airspace opacity is  partially enhancing throughout. Calcified coronary artery atherosclerosis. Negative thoracic aorta. No pericardial effusion. Heart size within normal limits. Mediastinum/Nodes: No mediastinal mass. Reactive appearing superior mediastinal lymph nodes similar to the earlier neck CTA. Lungs/Pleura: Intubated now, endotracheal tube tip in good position above the carina. Extensive new confluent bilateral upper lung airspace opacity since 0336 hours, left greater than right upper lobe consolidation. Left greater than right lower lobe consolidation. Mild bilateral middle lobe involvement. Trace superimposed layering pleural effusions. No pneumothorax. Upper Abdomen: Negative visible liver, spleen, stomach, adrenal glands. Musculoskeletal: No acute osseous abnormality identified. Review of the MIP images confirms the above findings. IMPRESSION: 1.  Negative for acute pulmonary embolus. 2. Positive for extensive bilateral pulmonary consolidation, greater in the left lung and new in the upper lungs compared to Neck CTA 0336 hours today. Favor Aspiration in this clinical setting. Trace superimposed pleural effusions. 3. Satisfactory endotracheal tube tip. 4. Reactive appearing mediastinal lymph nodes. 5. Calcified coronary artery atherosclerosis. Electronically Signed   By: Odessa Fleming M.D.   On: 07/26/2023 06:38   CT HEAD CODE STROKE WO CONTRAST  Result Date: 07/27/2023 CLINICAL DATA:  Code stroke. Initial evaluation for neuro deficit, stroke suspected. EXAM: CT ANGIOGRAPHY HEAD AND NECK TECHNIQUE: Multidetector CT imaging of the head and neck was performed using the standard protocol during bolus administration of intravenous contrast. Multiplanar CT image reconstructions and MIPs were obtained to evaluate the vascular anatomy. Carotid stenosis measurements (when applicable) are obtained utilizing NASCET criteria, using the distal internal carotid diameter as the denominator. RADIATION DOSE REDUCTION: This exam was performed  according to the departmental dose-optimization program which includes automated exposure control, adjustment of the mA and/or kV according to patient size and/or use of iterative reconstruction technique. CONTRAST:  Please see contrast active mentation. COMPARISON:  Prior study from 10/11/2020. FINDINGS: CT HEAD FINDINGS Brain: Atrophy with chronic microvascular ischemic disease. Encephalomalacia within the right cerebral hemisphere compatible with chronic right MCA distribution infarct. Chronic left PCA territory infarct. Multiple remote cerebellar infarcts. No acute intracranial hemorrhage. Subtly decreased attenuation at the level of the right frontal operculum suspicious for a superimposed evolving acute right MCA territory infarct (series 7, image 17). Basal ganglia maintained. No mass lesion or midline shift. No hydrocephalus or extra-axial fluid collection. Vascular: No visible hyperdense vessel. Skull: Scalp soft tissues and calvarium within normal limits. Sinuses/Orbits: Right gaze. Small left maxillary sinus retention cyst. Paranasal sinuses are otherwise largely clear. No mastoid effusion. Other: None. ASPECTS [facility] Stroke Program Early CT Score) - Ganglionic level infarction (caudate, lentiform nuclei, internal capsule, insula, M1-M3 cortex): 7 - Supraganglionic infarction (M4-M6 cortex): 2 Total score (0-10 with 10 being normal): 9 CTA NECK FINDINGS Aortic arch: Visualized arch within normal limits for caliber standard branch pattern. Mild atheromatous disease without hemodynamically significant stenosis. Right carotid system: Right common and internal carotid arteries are patent without dissection. Bulky calcified plaque about the right carotid bulb with approximate 30-40% stenosis by NASCET criteria. Left carotid system: Left common and internal carotid arteries are patent without dissection. Calcified plaque about the left carotid bulb with associated stenosis of up to 65-70% by NASCET criteria.  Vertebral arteries: Both vertebral arteries arise from subclavian arteries. Vertebral arteries are patent without stenosis  iterative reconstruction technique. CONTRAST:  75mL OMNIPAQUE IOHEXOL 350 MG/ML SOLN COMPARISON:  CTA neck 0336 hours today. FINDINGS: Cardiovascular: Adequate contrast bolus timing in the pulmonary arterial tree. No central or saddle embolus. No convincing pulmonary artery filling defect, and extensive bilateral pulmonary airspace opacity is  partially enhancing throughout. Calcified coronary artery atherosclerosis. Negative thoracic aorta. No pericardial effusion. Heart size within normal limits. Mediastinum/Nodes: No mediastinal mass. Reactive appearing superior mediastinal lymph nodes similar to the earlier neck CTA. Lungs/Pleura: Intubated now, endotracheal tube tip in good position above the carina. Extensive new confluent bilateral upper lung airspace opacity since 0336 hours, left greater than right upper lobe consolidation. Left greater than right lower lobe consolidation. Mild bilateral middle lobe involvement. Trace superimposed layering pleural effusions. No pneumothorax. Upper Abdomen: Negative visible liver, spleen, stomach, adrenal glands. Musculoskeletal: No acute osseous abnormality identified. Review of the MIP images confirms the above findings. IMPRESSION: 1.  Negative for acute pulmonary embolus. 2. Positive for extensive bilateral pulmonary consolidation, greater in the left lung and new in the upper lungs compared to Neck CTA 0336 hours today. Favor Aspiration in this clinical setting. Trace superimposed pleural effusions. 3. Satisfactory endotracheal tube tip. 4. Reactive appearing mediastinal lymph nodes. 5. Calcified coronary artery atherosclerosis. Electronically Signed   By: Odessa Fleming M.D.   On: 07/26/2023 06:38   CT HEAD CODE STROKE WO CONTRAST  Result Date: 07/27/2023 CLINICAL DATA:  Code stroke. Initial evaluation for neuro deficit, stroke suspected. EXAM: CT ANGIOGRAPHY HEAD AND NECK TECHNIQUE: Multidetector CT imaging of the head and neck was performed using the standard protocol during bolus administration of intravenous contrast. Multiplanar CT image reconstructions and MIPs were obtained to evaluate the vascular anatomy. Carotid stenosis measurements (when applicable) are obtained utilizing NASCET criteria, using the distal internal carotid diameter as the denominator. RADIATION DOSE REDUCTION: This exam was performed  according to the departmental dose-optimization program which includes automated exposure control, adjustment of the mA and/or kV according to patient size and/or use of iterative reconstruction technique. CONTRAST:  Please see contrast active mentation. COMPARISON:  Prior study from 10/11/2020. FINDINGS: CT HEAD FINDINGS Brain: Atrophy with chronic microvascular ischemic disease. Encephalomalacia within the right cerebral hemisphere compatible with chronic right MCA distribution infarct. Chronic left PCA territory infarct. Multiple remote cerebellar infarcts. No acute intracranial hemorrhage. Subtly decreased attenuation at the level of the right frontal operculum suspicious for a superimposed evolving acute right MCA territory infarct (series 7, image 17). Basal ganglia maintained. No mass lesion or midline shift. No hydrocephalus or extra-axial fluid collection. Vascular: No visible hyperdense vessel. Skull: Scalp soft tissues and calvarium within normal limits. Sinuses/Orbits: Right gaze. Small left maxillary sinus retention cyst. Paranasal sinuses are otherwise largely clear. No mastoid effusion. Other: None. ASPECTS [facility] Stroke Program Early CT Score) - Ganglionic level infarction (caudate, lentiform nuclei, internal capsule, insula, M1-M3 cortex): 7 - Supraganglionic infarction (M4-M6 cortex): 2 Total score (0-10 with 10 being normal): 9 CTA NECK FINDINGS Aortic arch: Visualized arch within normal limits for caliber standard branch pattern. Mild atheromatous disease without hemodynamically significant stenosis. Right carotid system: Right common and internal carotid arteries are patent without dissection. Bulky calcified plaque about the right carotid bulb with approximate 30-40% stenosis by NASCET criteria. Left carotid system: Left common and internal carotid arteries are patent without dissection. Calcified plaque about the left carotid bulb with associated stenosis of up to 65-70% by NASCET criteria.  Vertebral arteries: Both vertebral arteries arise from subclavian arteries. Vertebral arteries are patent without stenosis  presented with right M1 occlusion with left hemiplegia and right gaze deviation and underwent emergent successful mechanical thrombectomy but there was mild contrast extravasation in the sylvian fissure and MRI scan shows moderate size right MCA infarct with subarachnoid hemorrhage along with an old right MCA branch infarct as well.  Patient has a history of COPD and CHF and has high ventilatory needs at present.  Continue ongoing antibiotic support for respiratory failure and maintain hemodynamic support with strict control of blood pressure as per post thrombectomy protocol.  Consider weaning off ventilatory support in the next few days as tolerated.  Long discussion with the patient's son at the bedside and answered questions.  Prognosis is guarded.  Family needs to decide on goals of care including one-way extubation versus reintubation with trach and PEG and nursing home placement if she does not do well postextubation.  Discussed with critical care team.This patient is critically ill and at significant risk of neurological worsening, death and care requires constant monitoring of vital signs, hemodynamics,respiratory and cardiac monitoring, extensive review of multiple databases, frequent neurological assessment, discussion with family, other specialists and medical decision making of high complexity.I have made any additions or clarifications directly to the above note.This critical care time does not reflect procedure time, or teaching time or supervisory time of PA/NP/Med Resident etc but could involve care  discussion time.  I spent 30 minutes of neurocritical care time  in the care of  this patient.      Delia Heady, MD Medical Director [facility] Stroke Center Pager: [phone] 08/17/2023 4:38 PM  To contact Stroke Continuity provider, please refer to [email or web address]. After hours, contact General Neurology

## 2023-08-19 NOTE — Progress Notes (Addendum)
Patient hypotensive to the 70's via Aline. Precedex discontinued. Prop currently off. Albumin and LR bolus ordered and infusing. Will start peripheral Levo if no improvement.

## 2023-08-19 NOTE — Anesthesia Preprocedure Evaluation (Signed)
Anesthesia Evaluation  Patient identified by MRN, date of birth, ID band Patient unresponsive  General Assessment Comment:Patient intubatedPreop documentation limited or incomplete due to emergent nature of procedure.  Airway Mallampati: Intubated       Dental   Pulmonary COPD, Current Smoker   breath sounds clear to auscultation       Cardiovascular  Rhythm:regular Rate:Normal     Neuro/Psych    GI/Hepatic   Endo/Other  diabetes, Type 2    Renal/GU      Musculoskeletal   Abdominal   Peds  Hematology   Anesthesia Other Findings   Reproductive/Obstetrics                             Anesthesia Physical Anesthesia Plan  ASA: 4 and emergent  Anesthesia Plan: General   Post-op Pain Management:    Induction: Intravenous  PONV Risk Score and Plan: 2 and Ondansetron, Dexamethasone and Treatment may vary due to age or medical condition  Airway Management Planned: Oral ETT  Additional Equipment: Arterial line  Intra-op Plan:   Post-operative Plan: Post-operative intubation/ventilation  Informed Consent: I have reviewed the patients History and Physical, chart, labs and discussed the procedure including the risks, benefits and alternatives for the proposed anesthesia with the patient or authorized representative who has indicated his/her understanding and acceptance.       Plan Discussed with: CRNA, Anesthesiologist and Surgeon  Anesthesia Plan Comments:        Anesthesia Quick Evaluation

## 2023-08-19 NOTE — Consult Note (Signed)
NAME:  MONTY SEMAN, MRN:  [id number], DOB:  07-03-1961, LOS: 0 ADMISSION DATE:  11-Sep-2023, CONSULTATION DATE:  Sep 11, 2023 REFERRING MD:  Dr. Derry Lory - stroke, CHIEF COMPLAINT:  Vent management    History of Present Illness:  Jill Barnes is a 62 year old female with a past medical history significant for COPD, diabetes, smoker, remote history of DVT, and prior right MCA stroke with residual left-sided weakness who presented to the ED with left hemiplegia with neglect of left side and right gaze deviation.  Last known normal 2300 on 9/26, code stroke initiated.  CT head on admission concerning for right MCA territory infarct, CTA head and neck positive for large vessel occlusion resulting in emergent consult interventional IR for thrombectomy.  Patient decompensation post CT with hypoxia resulting in emergent intubation.  Post intubation carotid arteriogram was preformed for occluded right middle cerebral artery with complete revascularization but observed contrast extravasation on follow-up angio.  Concern for aspiration event prior to intubation given high vent requirements therefore postprocedure patient was left intubated and PCCM was consulted for further assistance in management.  Pertinent  Medical History  COPD, diabetes, smoker, remote history of DVT, and prior right MCA stroke with residual left-sided weakness   Significant Hospital Events: Including procedures, antibiotic start and stop dates in addition to other pertinent events   09-11-23 presented with left-sided hemiplegia and right gaze deviation in the setting of acute ischemic stroke.  Thrombectomy per IR, left intubated postprocedure  Interim History / Subjective:  As above  Objective   Blood pressure (!) 168/94, pulse (!) 118, resp. rate (!) 31, height 5\' 6"  (1.676 m), weight 113.8 kg, SpO2 93%.    Vent Mode: PRVC FiO2 (%):  [100 %] 100 % Set Rate:  [20 bmp] 20 bmp Vt Set:  [460 mL-470 mL] 460 mL PEEP:  [8 cmH20-10  cmH20] 10 cmH20   Intake/Output Summary (Last 24 hours) at 09/11/23 5784 Last data filed at 09-11-2023 6962 Gross per 24 hour  Intake 1500 ml  Output 350 ml  Net 1150 ml   Filed Weights   09-11-2023 0300  Weight: 113.8 kg    Examination: General: Acute on chronic ill-appearing middle-aged female lying in bed on mechanical ventilation in no acute distress HEENT: ETT, MM pink/moist, PERRL,  Neuro: Deeply sedated on ventilator CV: s1s2 regular rate and rhythm, no murmur, rubs, or gallops,  PULM: Bilateral rhonchi, no increased work of breathing, tolerating ventilator, currently remains on 100% FiO2 GI: soft, bowel sounds active in all 4 quadrants, non-tender, non-distended Extremities: warm/dry, no edema  Skin: no rashes or lesions   Resolved Hospital Problem list     Assessment & Plan:  Acute right MCA stroke with right MCA M1 occlusion -S/p thrombectomy with post intervention contrast extravasation seen History of prior right MCA stroke with residual left-sided weakness P: Management per neurology  Maintain neuro protective measures; goal for eurothermia, euglycemia, eunatermia, normoxia, and PCO2 goal of 35-40 Nutrition and bowel regiment  Seizure precautions  Aspirations precautions  Secondary stroke prevention Obtain echo SBP goal per neurology PT/OT/SLP when able  Acute Hypoxic Respiratory Failure  Concern for aspiration pneumonia History of COPD  Daily tobacco use  -Patient emergently intubated per EDP after decompensation post that head CT.  Per chart oxygen saturations dropped as low as 70 with dusky appearance. P: Continue ventilator support with lung protective strategies  Wean PEEP and FiO2 for sats greater than 90%. Head of bed elevated 30 degrees. Plateau pressures less than  30 cm H20.  Follow intermittent chest x-ray and ABG.   SAT/SBT as tolerated, mentation preclude extubation  Ensure adequate pulmonary hygiene  Follow cultures  VAP bundle in place   PAD protocol Empiric Unasyn   Cessation education when appropriate   History of diabetes -Home medications include NPH P: SSI CBG checks every 4 CBG goal 140-180   Best Practice (right click and "Reselect all SmartList Selections" daily)   Diet/type: NPO DVT prophylaxis: SCD GI prophylaxis: PPI Lines: N/A Foley:  Yes, and it is still needed Code Status:  full code Last date of multidisciplinary goals of care discussion: Pending   Labs   CBC: Recent Labs  Lab September 04, 2023 0328 09-04-23 0357  WBC 9.2  --   NEUTROABS 6.9  --   HGB 12.6 14.6  HCT 39.2 43.0  MCV 102.1*  --   PLT 203  --     Basic Metabolic Panel: Recent Labs  Lab Sep 04, 2023 0328 09-04-23 0357  NA 136 141  K 3.6 4.1  CL 104 106  CO2 22  --   GLUCOSE 104* 82  BUN 9 12  CREATININE 1.17* 1.10*  CALCIUM 8.1*  --    GFR: Estimated Creatinine Clearance (by C-G formula based on SCr of 1.1 mg/dL (H)) Female: 16.1 mL/min (A) Female: 83.6 mL/min (A) Recent Labs  Lab September 04, 2023 0328  WBC 9.2    Liver Function Tests: Recent Labs  Lab 09-04-2023 0328  AST 33  ALT 24  ALKPHOS 85  BILITOT 0.7  PROT 8.5*  ALBUMIN 2.8*   No results for input(s): "LIPASE", "AMYLASE" in the last 168 hours. No results for input(s): "AMMONIA" in the last 168 hours.  ABG    Component Value Date/Time   HCO3 23.5 03/23/2022 1655   TCO2 24 Sep 04, 2023 0357   ACIDBASEDEF 1.0 03/23/2022 1655   O2SAT 84 03/23/2022 1655     Coagulation Profile: Recent Labs  Lab 2023/09/04 0328  INR 1.1    Cardiac Enzymes: No results for input(s): "CKTOTAL", "CKMB", "CKMBINDEX", "TROPONINI" in the last 168 hours.  HbA1C: Hemoglobin A1C  Date/Time Value Ref Range Status  03/29/2022 02:07 PM 10.0 (A) 4.0 - 5.6 % Final  02/09/2022 02:40 PM 9.7 (A) 4.0 - 5.6 % Final   Hgb A1c MFr Bld  Date/Time Value Ref Range Status  10/12/2020 01:57 AM 9.1 (H) 4.8 - 5.6 % Final    Comment:    (NOTE) Pre diabetes:          5.7%-6.4%  Diabetes:               >6.4%  Glycemic control for   <7.0% adults with diabetes   06/20/2020 09:43 PM 7.3 (H) 4.8 - 5.6 % Final    Comment:    (NOTE) Pre diabetes:          5.7%-6.4%  Diabetes:              >6.4%  Glycemic control for   <7.0% adults with diabetes     CBG: No results for input(s): "GLUCAP" in the last 168 hours.  Review of Systems:   Unable to assess   Past Medical History:  She,  has a past medical history of COPD (chronic obstructive pulmonary disease) (HCC), Diabetes mellitus without complication (HCC), and Kidney stone.   Surgical History:   Past Surgical History:  Procedure Laterality Date   ABDOMINAL HYSTERECTOMY     BLADDER SURGERY       Social History:   reports  that she has been smoking cigarettes. She has never used smokeless tobacco. She reports that she does not drink alcohol and does not use drugs.   Family History:  Her family history includes Diabetes in her father and mother; Heart failure in her father.   Allergies Allergies  Allergen Reactions   Sulfa Antibiotics Shortness Of Breath and Swelling    All-over swelling   Penicillins Rash     Home Medications  Prior to Admission medications   Medication Sig Start Date End Date Taking? Authorizing Provider  albuterol (VENTOLIN HFA) 108 (90 Base) MCG/ACT inhaler Inhale 1-2 puffs into the lungs every 6 (six) hours as needed for wheezing or shortness of breath. 07/05/23   Prosperi, Christian H, PA-C  aspirin EC 81 MG tablet Take 81 mg by mouth daily. Swallow whole.    [provider]  atorvastatin (LIPITOR) 20 MG tablet Take 1 tablet (20 mg total) by mouth daily. 07/02/20   Elgergawy, Leana Roe, MD  Continuous Blood Gluc Sensor (FREESTYLE LIBRE 14 DAY SENSOR) MISC Change every 14 days 03/29/22   Romero Belling, MD  furosemide (LASIX) 20 MG tablet Take 1 tablet (20 mg total) by mouth daily. 07/05/23   Prosperi, Christian H, PA-C  ibuprofen (ADVIL) 600 MG tablet Take 1 tablet (600 mg total) by  mouth every 8 (eight) hours as needed. 08/27/21   Caccavale, Sophia, PA-C  insulin NPH Human (NOVOLIN N) 100 UNIT/ML injection Inject 1.8 mLs (180 Units total) into the skin every morning. 130 units each morning, and 35 units in the evening. 03/29/22   Romero Belling, MD  lisinopril (ZESTRIL) 5 MG tablet Take 1 tablet (5 mg total) by mouth daily. 10/13/20 09/24/21  Lorin Glass, MD  metoCLOPramide (REGLAN) 10 MG tablet Take 1 tablet (10 mg total) by mouth every 8 (eight) hours as needed for nausea. 08/27/21   Caccavale, Sophia, PA-C  ondansetron (ZOFRAN-ODT) 4 MG disintegrating tablet Take 1 tablet (4 mg total) by mouth every 8 (eight) hours as needed for nausea or vomiting. 03/23/22   Tanda Rockers, PA-C  oxyCODONE-acetaminophen (PERCOCET/ROXICET) 5-325 MG tablet Take 1 tablet by mouth every 6 (six) hours as needed for severe pain. 03/23/22   Hyman Hopes, Margaux, PA-C  pantoprazole (PROTONIX) 40 MG tablet Take 1 tablet (40 mg total) by mouth daily. 07/03/20   Elgergawy, Leana Roe, MD     Critical care time:   CRITICAL CARE Performed by: Daylee Delahoz D. Harris  Total critical care time: 42 minutes  Critical care time was exclusive of separately billable procedures and treating other patients.  Critical care was necessary to treat or prevent imminent or life-threatening deterioration.  Critical care was time spent personally by me on the following activities: development of treatment plan with patient and/or surrogate as well as nursing, discussions with consultants, evaluation of patient's response to treatment, examination of patient, obtaining history from patient or surrogate, ordering and performing treatments and interventions, ordering and review of laboratory studies, ordering and review of radiographic studies, pulse oximetry and re-evaluation of patient's condition.  Treyshon Buchanon D. Harris, NP-C [facility] Personal contact information can be found on Amion  If no contact or response  made please call [phone] 08/12/2023, 7:45 AM

## 2023-08-19 NOTE — Plan of Care (Signed)
  Problem: Metabolic: Goal: Ability to maintain appropriate glucose levels will improve Outcome: Not Progressing    Patient hyperglycemic. CCM aware, sliding scale adjusted.

## 2023-08-19 NOTE — Progress Notes (Signed)
Patient hypothermic post IR. Esophageal probe placed, Bair hugger ordered and placed.

## 2023-08-19 NOTE — Progress Notes (Signed)
PT Cancellation Note  Patient Details Name: Jill Barnes MRN: [id number] DOB: 04-03-1961   Cancelled Treatment:    Reason Eval/Treat Not Completed: (P) Active bedrest order;Patient not medically ready. Bedrest orders until 3:30 PM today and RN reporting pt is sedated and intubated, requesting hold on PT for the day. Will plan to follow-up another day as able.   Virgil Benedict, PT, DPT Acute Rehabilitation Services  Office: [phone]    Bettina Gavia 08/17/2023, 8:18 AM

## 2023-08-19 NOTE — Progress Notes (Signed)
2023/09/02 1500  Art Line  Arterial Line BP 90/52 (MD aware...switching sedatives)  Arterial Line MAP (mmHg) 66 mmHg

## 2023-08-19 NOTE — Sedation Documentation (Signed)
ED Provider at bedside. 

## 2023-08-19 NOTE — Anesthesia Postprocedure Evaluation (Signed)
Anesthesia Post Note  Patient: Jill Barnes  Procedure(s) Performed: IR WITH ANESTHESIA     Patient location during evaluation: SICU Anesthesia Type: General Level of consciousness: sedated Pain management: pain level controlled Vital Signs Assessment: post-procedure vital signs reviewed and stable Respiratory status: patient remains intubated per anesthesia plan Cardiovascular status: stable Postop Assessment: no apparent nausea or vomiting Anesthetic complications: no   No notable events documented.  Last Vitals:  Vitals:   07/28/2023 0359 08/15/2023 0656  BP:    Pulse: (!) 118   Resp: (!) 31   SpO2: 96% 93%    Last Pain: There were no vitals filed for this visit.               Vannia Pola S

## 2023-08-19 NOTE — Progress Notes (Signed)
Initial Nutrition Assessment  DOCUMENTATION CODES:   Obesity unspecified  INTERVENTION:  Initiate tube feeding via OGT: Start Osmolite 1.2 at 20ml and advance by 10ml q4h to a gaol rate of 50 ml/h (1200 ml per day) Prosource TF20 60 ml once daily  Goal tube feeding regimen provides: 1520 kcal, 87g protein, total free water  NUTRITION DIAGNOSIS:   Inadequate oral intake related to acute illness as evidenced by NPO status.  GOAL:   Patient will meet greater than or equal to 90% of their needs  MONITOR:   Vent status, Labs, TF tolerance, Weight trends  REASON FOR ASSESSMENT:   Ventilator, Consult Enteral/tube feeding initiation and management  ASSESSMENT:   Pt admitted d/t acute ischemic R MCA stroke. PMH significant for COPD, DM, smoker, remote history of DVT, prior R MCA stroke with residual L sided weakness.  September 04, 2023: admitted, intubated, s/p thrombectomy; OGT placed (side port gastric)  Discussed plan with CCM. Agree to TF initiation.   Patient is currently intubated on ventilator support MV: 11.2 L/min Temp (24hrs), Avg:95.5 F (35.3 C), Min:93.7 F (34.3 C), Max:97.5 F (36.4 C)  Propofol: 20.5 ml/hr (provides 447kcal per day)  Limited weight history on file to review over the last year. Pt's weight appears to have increased over the last 9.5 months.   Edema: moderate pitting BLE  Medications: colace, SSI 0-20 units q4h, IV Protonix, miralax Drips: NaCl @ 40ml/hr, abx  Labs: Cr 1.10, ionized Ca 1.06, GFR 53, CBG's 281, 159, HgbA1c 7.5%    NUTRITION - FOCUSED PHYSICAL EXAM: RD working remotely. Deferred to follow up.   Diet Order:   Diet Order             Diet NPO time specified  Diet effective now                   EDUCATION NEEDS:   No education needs have been identified at this time  Skin:  Skin Assessment: Reviewed RN Assessment  Last BM:  04-Sep-2023  Height:   Ht Readings from Last 1 Encounters:  2023/09/04 5\' 6"  (1.676 m)     Weight:   Wt Readings from Last 1 Encounters:  Sep 04, 2023 113.8 kg    Ideal Body Weight:  59.1 kg  BMI:  Body mass index is 40.49 kg/m.  Estimated Nutritional Needs:   Kcal:  1500-1700  Protein:  75-90g  Fluid:  >/=1.5L  Drusilla Kanner, RDN, LDN Clinical Nutrition

## 2023-08-19 NOTE — H&P (Addendum)
NEUROLOGY H&P NOTE   Date of service: 09/15/23 Patient Name: Jill Barnes MRN:  [id number] DOB:  1961/09/11 Chief Complaint: "L sided weakness"  History of Present Illness  Jill Barnes is a 62 y.o. female with hx of smoker, COPD, remote hx of DVT, DM2, nephrolithiasis, prior R MCA stroke with residual mild Left sided weakness and left inferior hemianopsia per prior neurology notes who presents with left hemiplegia, R gaze deviation, left sided neglect.  I was unable to get in touch with her family. Per EMS, went to bed at 2300 and woke up with left sided weakness. Family told EMS on scene that she is on blood thinners. EMS reviewed the medication list that famil gave them and only saw aspirin.  She was brought in as a code stroke.  Last known well: 2300 on 08/18/23. Modified rankin score: 1-No significant post stroke disability and can perform usual duties with stroke symptoms tNKASE: not offered, she was just outside the tnkase window by the time we got the non contrast CT head. Thrombectomy: unable to get in touch with her family on the listed phone number despite multiple attempts. Patient is awake, she does not recall her significant other's phone number. Given she has neglect, she unfortunately, is unable to grasp the true extent of her deficit. Given significantly worse exam compared to neuro exam from 2022, both me and De. Deveshwar agreed that the benefits of thrombectomy outweigh the risks. There was some delay to transporting her to the IR suite as she was satting in 80s on 14L oxygen and tachypneic and therefore intubated in the ED and then taken for a STAT CT to evaluate for CTPE given her prior remote hx of DVTs. This resulted in delay in getting her to the IR suite. NIHSS components Score: Comment  1a Level of Conscious 0[x]  1[]  2[]  3[]      1b LOC Questions 0[x]  1[]  2[]       1c LOC Commands 0[x]  1[]  2[]       2 Best Gaze 0[]  1[]  2[x]       3 Visual 0[]  1[]  2[x]  3[]       4 Facial Palsy 0[]  1[]  2[x]  3[]      5a Motor Arm - left 0[]  1[]  2[]  3[]  4[x]  UN[]    5b Motor Arm - Right 0[x]  1[]  2[]  3[]  4[]  UN[]    6a Motor Leg - Left 0[]  1[]  2[]  3[]  4[x]  UN[]    6b Motor Leg - Right 0[x]  1[]  2[]  3[]  4[]  UN[]    7 Limb Ataxia 0[x]  1[]  2[]  3[]  UN[]     8 Sensory 0[]  1[]  2[x]  UN[]      9 Best Language 0[x]  1[]  2[]  3[]      10 Dysarthria 0[]  1[x]  2[]  UN[]      11 Extinct. and Inattention 0[]  1[]  2[x]       TOTAL: 18      ROS   Unable to obtain review of system secondary to acuity of the situation and tachypnea  Past History   Past Medical History:  Diagnosis Date   COPD (chronic obstructive pulmonary disease) (HCC)    Diabetes mellitus without complication (HCC)    Kidney stone    Past Surgical History:  Procedure Laterality Date   ABDOMINAL HYSTERECTOMY     BLADDER SURGERY     Family History  Problem Relation Age of Onset   Diabetes Mother    Heart failure Father    Diabetes Father    Social History   Socioeconomic History  Marital status: Married    Spouse name: Not on file   Number of children: Not on file   Years of education: Not on file   Highest education level: Not on file  Occupational History   Occupation: unemployed  Tobacco Use   Smoking status: Every Day    Current packs/day: 1.00    Types: Cigarettes   Smokeless tobacco: Never  Vaping Use   Vaping status: Never Used  Substance and Sexual Activity   Alcohol use: No   Drug use: No   Sexual activity: Not on file  Other Topics Concern   Not on file  Social History Narrative   Lives with husband   Right handed   Drinks 3-5 cups of caffeine daily   Social Determinants of Health   Financial Resource Strain: Not on file  Food Insecurity: Not on file  Transportation Needs: Not on file  Physical Activity: Not on file  Stress: Not on file  Social Connections: Not on file   Allergies  Allergen Reactions   Sulfa Antibiotics Shortness Of Breath and Swelling    All-over  swelling   Penicillins Rash    Medications  (Not in a hospital admission)    Vitals   Vitals:   08/01/2023 0300 07/26/2023 0359  Pulse:  (!) 118  Resp:  (!) 31  SpO2:  96%  Weight: 113.8 kg   Height: 5\' 6"  (1.676 m)      Body mass index is 40.49 kg/m.  Physical Exam   Constitutional: Appears obese, slumped over on the left. Psych: Affect appropriate to situation.  Eyes: No scleral injection.  HENT: No OP obstrucion.  Head: Normocephalic.  Cardiovascular: Normal rate and regular rhythm.  Respiratory: Effort normal, tachypneic, on non rebreather per EMS as she was desatting to mid 80s. Slightly labored breathing. GI: Soft.  No distension. There is no tenderness. Skin: WDI.   Neurologic Examination  Mental status/Cognition: Alert, oriented to self, place, month and year, good attention. Speech/language: dysarthric speech, non fluent, comprehension intact, object naming intact. Cranial nerves:   CN II Pupils equal and reactive to light, L hemianopsia   CN III,IV,VI R gaze deviation, does not cross midline   CN V Corneals intact   CN VII L facial droop   CN VIII normal hearing to speech   CN IX & X Unable to assess.   CN XI 5/5 head turn and 5/5 shoulder shrug bilaterally   CN XII midline tongue protrusion   Motor:  Muscle bulk: poor, tone flaccid in LUE and LLE.  No movement noted in LUE and LLE.  Hold RUE off the bed for more than 10 secs without any drift.  Hold RLE off the bed for more than 10 secs without any drift.  Reflexes:  Right Left Comments  Pectoralis      Biceps (C5/6)     Brachioradialis (C5/6)      Triceps (C6/7)      Patellar (L3/4)      Achilles (S1)      Hoffman      Plantar     Jaw jerk    Sensation:  Light touch Absent to touch in LUE and LLE with left sided sensory neglect.   Pin prick    Temperature    Vibration   Proprioception    Coordination/Complex Motor:  - Finger to Nose intact on the right. - Heel to shin unable to  do - Rapid alternating movement unable to do with left. -  Gait: deferred for patient safety.   Labs   CBC:  Recent Labs  Lab 08/31/2023 0328 09/05/2023 0357  WBC 9.2  --   NEUTROABS 6.9  --   HGB 12.6 14.6  HCT 39.2 43.0  MCV 102.1*  --   PLT 203  --     Basic Metabolic Panel:  Lab Results  Component Value Date   NA 141 August 24, 2023   K 4.1 Aug 24, 2023   CO2 24 07/05/2023   GLUCOSE 82 09/19/2023   BUN 12 Aug 24, 2023   CREATININE 1.10 (H) 09/17/2023   CALCIUM 8.7 (L) 07/05/2023   GFRNONAA 59 (L) 07/05/2023   GFRAA >60 07/02/2020   Lipid Panel:  Lab Results  Component Value Date   LDLCALC 125 (H) 10/12/2020   HgbA1c:  Lab Results  Component Value Date   HGBA1C 10.0 (A) 03/29/2022   Urine Drug Screen: No results found for: "LABOPIA", "COCAINSCRNUR", "LABBENZ", "AMPHETMU", "THCU", "LABBARB"  Alcohol Level No results found for: "ETH" INR  Lab Results  Component Value Date   INR 1.1 August 24, 2023   APTT  Lab Results  Component Value Date   APTT 26 09/05/2023    CT Head without contrast(Personally reviewed): CTH was negative for a large hypodensity concerning for a large territory infarct or hyperdensity concerning for an ICH. R MCA encephalomalacia. ASPECTS of 9.  CT angio Head and Neck with contrast(Personally reviewed): Distal R MCA M1 occlusion.  MRI Brain: pending  Impression   Averey AVALINA LIECHTY is a 62 y.o. female with hx of smoker, COPD, remote hx of DVT, DM2, nephrolithiasis, prior R MCA stroke with residual mild Left sided weakness and left inferior hemianopsia per prior neurology notes who presents with left hemiplegia, R gaze deviation, left sided neglect. She was found to have distal R MCA M1 occlusion. Unable to administer tnkase in time as she was outside window by the time, we were able to get CT head. She was taken to IR for thrombectomy.  Primary Diagnosis:  Cerebral infarction due to embolism of  right middle cerebral artery.   Secondary  Diagnosis: Essential (primary) hypertension, Type 2 diabetes mellitus with hyperglycemia , and Morbid Obesity(BMI > 40)  Recommendations  Acute Right MCA stroke with R MCA M1 occlusion taken for thrombectomy: - Frequent Neuro checks per stroke unit protocol - Recommend brain imaging with MRI Brain without contrast - Recommend obtaining TTE - Recommend obtaining Lipid panel with LDL - Please start statin if LDL > 70 - Recommend HbA1c to evaluate for diabetes and how well it is controlled. - Antithrombotic - per neuro IR for the first 24 hours after intervention - Recommend DVT ppx - SBP goal - per Neuro IR for the first 24 hours after intervention. - Recommend Telemetry monitoring for arrythmia - Recommend bedside swallow screen prior to PO intake. - Stroke education booklet - Recommend PT/OT/SLP consult  COPD: - cont home inhalers  DM2: - sliding scale insulin with carb modified diet.  GERD: - cont home protonix.  HLD: - Lipid panel pending - cont home statin.  Code status: presumed full code. Unable to get in touch with family as of filing this note. Unable to verify allergies. ______________________________________________________________________  This patient is critically ill and at significant risk of neurological worsening, death and care requires constant monitoring of vital signs, hemodynamics,respiratory and cardiac monitoring, neurological assessment, discussion with family, other specialists and medical decision making of high complexity. I spent 70 minutes of neurocritical care time  in the care of  this patient. This was time spent independent of any time provided by nurse practitioner or PA.  Erick Blinks [facility] Sep 04, 2023  5:22 AM   Signed,  Erick Blinks

## 2023-08-19 NOTE — Progress Notes (Signed)
OT Cancellation Note  Patient Details Name: Jill Barnes MRN: [id number] DOB: Apr 10, 1961   Cancelled Treatment:    Reason Eval/Treat Not Completed: Patient not medically ready  Eric Morganti,[name] 07/29/2023, 6:56 AM Luisa Dago, OT/L   Acute OT Clinical Specialist Acute Rehabilitation Services Pager [phone] Office [phone]

## 2023-08-19 NOTE — Progress Notes (Signed)
SLP Cancellation Note  Patient Details Name: DALEENA ROTTER MRN: [id number] DOB: 25-Nov-1960   Cancelled treatment:       Reason Eval/Treat Not Completed: Medical issues which prohibited therapy. Pt intubated. Will continue to follow for readiness.    Gwynneth Aliment, M.A., CF-SLP Speech Language Pathology, Acute Rehabilitation Services  Secure Chat preferred [phone]  08/14/2023, 4:35 PM

## 2023-08-19 NOTE — Transfer of Care (Signed)
Immediate Anesthesia Transfer of Care Note  Patient: Jill Barnes  Procedure(s) Performed: IR WITH ANESTHESIA  Patient Location: ICU  Anesthesia Type:General  Level of Consciousness: Patient remains intubated per anesthesia plan  Airway & Oxygen Therapy: Patient remains intubated per anesthesia plan and Patient placed on Ventilator (see vital sign flow sheet for setting)  Post-op Assessment: Report given to RN and Post -op Vital signs reviewed and stable  Post vital signs: Reviewed and stable  Last Vitals:  Vitals Value Taken Time  BP 135/68 08/21/2023 0715  Temp    Pulse 88 08/20/2023 0718  Resp 20 08/12/2023 0718  SpO2 93 % 08/16/2023 0718  Vitals shown include unfiled device data.  Last Pain: There were no vitals filed for this visit.       Complications: No notable events documented.

## 2023-08-19 NOTE — Code Documentation (Signed)
Responded to Code Stroke called at 0303 for L sided paralysis, L facial droop, R gaze, and slurred speech, [id number]. Pt arrived at 0321, CBG-106, NIH-18, CT head  negative for acute changes. TNK not given-pt just outside window of treatment at the time of CT result and BP being under TNK parameters. CTA-R distal MCA M1 occlusion. IR paged out at 0348. Pt SpO2-82% on NRB and breathing labored after CTA done. Pt returned to ED room where she was given 20mg  etomidate, 100mg  Roc, and intubated. Pt then taken to CTA chest r/o PE study. This was done prior to IR per MD order. Pt arrived to IR suite at [phone]

## 2023-08-19 NOTE — Procedures (Signed)
INR.  Status post right common carotid arteriogram.  Right CFA approach.  Findings. 1.  Occluded right middle cerebral artery mid M1 segment.  Status post complete revascularization of occluded right middle cerebral artery M1 segment with 1 pass with 070 catheter aspiration achieving a TICI 3 revascularization.  Transient mild contrast extravasation noted in the sylvian fissure on  follow up angio..  CT confirmed this to be  a moderate amount of mixture of contrast and blood.  No mass effect noted.  Superselective angiograms through the microcatheter demonstrated no active extravasation. 8 French Angio-Seal closure device deployed  at the right groin puncture site.  Distal pulses  present unchanged from prior to procedure.  Patient left intubated due to her initial neurological presentation.  Fatima Sanger MD.

## 2023-08-19 NOTE — Sedation Documentation (Signed)
ETT 7.5 fr placed by [employer] PA. Positive color change noted. Breath sounds BL. O2 at 93% on the vent

## 2023-08-20 ENCOUNTER — Inpatient Hospital Stay (HOSPITAL_COMMUNITY): Payer: Medicare Other

## 2023-08-20 LAB — POCT I-STAT 7, (LYTES, BLD GAS, ICA,H+H)
Acid-base deficit: 3 mmol/L — ABNORMAL HIGH (ref 0.0–2.0)
Acid-base deficit: 3 mmol/L — ABNORMAL HIGH (ref 0.0–2.0)
Bicarbonate: 22.9 mmol/L (ref 20.0–28.0)
Bicarbonate: 23.8 mmol/L (ref 20.0–28.0)
Calcium, Ion: 1.1 mmol/L — ABNORMAL LOW (ref 1.15–1.40)
Calcium, Ion: 1.1 mmol/L — ABNORMAL LOW (ref 1.15–1.40)
HCT: 32 % — ABNORMAL LOW (ref 36.0–46.0)
HCT: 30 % — ABNORMAL LOW (ref 36.0–46.0)
Hemoglobin: 10.9 g/dL — ABNORMAL LOW (ref 12.0–15.0)
Hemoglobin: 10.2 g/dL — ABNORMAL LOW (ref 12.0–15.0)
O2 Saturation: 96 %
O2 Saturation: 98 %
Patient temperature: 98.4
Patient temperature: 36.6
Potassium: 4.1 mmol/L (ref 3.5–5.1)
Potassium: 4 mmol/L (ref 3.5–5.1)
Sodium: 141 mmol/L (ref 135–145)
Sodium: 142 mmol/L (ref 135–145)
TCO2: 24 mmol/L (ref 22–32)
TCO2: 25 mmol/L (ref 22–32)
pCO2 arterial: 43.6 mm[Hg] (ref 32–48)
pCO2 arterial: 50.3 mm[Hg] — ABNORMAL HIGH (ref 32–48)
pH, Arterial: 7.327 — ABNORMAL LOW (ref 7.35–7.45)
pH, Arterial: 7.282 — ABNORMAL LOW (ref 7.35–7.45)
pO2, Arterial: 91 mm[Hg] (ref 83–108)
pO2, Arterial: 111 mm[Hg] — ABNORMAL HIGH (ref 83–108)

## 2023-08-20 LAB — GLUCOSE, CAPILLARY
Glucose-Capillary: 152 mg/dL — ABNORMAL HIGH (ref 70–99)
Glucose-Capillary: 154 mg/dL — ABNORMAL HIGH (ref 70–99)
Glucose-Capillary: 178 mg/dL — ABNORMAL HIGH (ref 70–99)
Glucose-Capillary: 210 mg/dL — ABNORMAL HIGH (ref 70–99)
Glucose-Capillary: 221 mg/dL — ABNORMAL HIGH (ref 70–99)
Glucose-Capillary: 255 mg/dL — ABNORMAL HIGH (ref 70–99)

## 2023-08-20 LAB — CBC WITH DIFFERENTIAL/PLATELET
Abs Immature Granulocytes: 0.14 10*3/uL — ABNORMAL HIGH (ref 0.00–0.07)
Basophils Absolute: 0 10*3/uL (ref 0.0–0.1)
Basophils Relative: 0 %
Eosinophils Absolute: 0 10*3/uL (ref 0.0–0.5)
Eosinophils Relative: 0 %
HCT: 32.2 % — ABNORMAL LOW (ref 36.0–46.0)
Hemoglobin: 10.1 g/dL — ABNORMAL LOW (ref 12.0–15.0)
Immature Granulocytes: 1 %
Lymphocytes Relative: 10 %
Lymphs Abs: 1.3 10*3/uL (ref 0.7–4.0)
MCH: 32.8 pg (ref 26.0–34.0)
MCHC: 31.4 g/dL (ref 30.0–36.0)
MCV: 104.5 fL — ABNORMAL HIGH (ref 80.0–100.0)
Monocytes Absolute: 0.9 10*3/uL (ref 0.1–1.0)
Monocytes Relative: 7 %
Neutro Abs: 11 10*3/uL — ABNORMAL HIGH (ref 1.7–7.7)
Neutrophils Relative %: 82 %
Platelets: 188 10*3/uL (ref 150–400)
RBC: 3.08 MIL/uL — ABNORMAL LOW (ref 3.87–5.11)
RDW: 15.3 % (ref 11.5–15.5)
WBC: 13.4 10*3/uL — ABNORMAL HIGH (ref 4.0–10.5)
nRBC: 0.1 % (ref 0.0–0.2)

## 2023-08-20 LAB — BASIC METABOLIC PANEL
Anion gap: 10 (ref 5–15)
BUN: 13 mg/dL (ref 8–23)
CO2: 21 mmol/L — ABNORMAL LOW (ref 22–32)
Calcium: 7.6 mg/dL — ABNORMAL LOW (ref 8.9–10.3)
Chloride: 107 mmol/L (ref 98–111)
Creatinine, Ser: 1.19 mg/dL — ABNORMAL HIGH (ref 0.44–1.00)
GFR, Estimated: 52 mL/min — ABNORMAL LOW (ref >60)
Glucose, Bld: 148 mg/dL — ABNORMAL HIGH (ref 70–99)
Potassium: 3.9 mmol/L (ref 3.5–5.1)
Sodium: 138 mmol/L (ref 135–145)

## 2023-08-20 LAB — PHOSPHORUS
Phosphorus: 3 mg/dL (ref 2.5–4.6)
Phosphorus: 2.8 mg/dL (ref 2.5–4.6)

## 2023-08-20 LAB — MAGNESIUM
Magnesium: 1.7 mg/dL (ref 1.7–2.4)
Magnesium: 2.6 mg/dL — ABNORMAL HIGH (ref 1.7–2.4)

## 2023-08-20 LAB — TRIGLYCERIDES: Triglycerides: 278 mg/dL — ABNORMAL HIGH (ref <150)

## 2023-08-20 MED ORDER — MAGNESIUM SULFATE 2 GM/50ML IV SOLN
2.0000 g | Freq: Once | INTRAVENOUS | Status: AC
  Administered 2023-08-20: 2 g via INTRAVENOUS
  Filled 2023-08-20: qty 50

## 2023-08-20 MED ORDER — FENTANYL CITRATE PF 50 MCG/ML IJ SOSY: 50.0000 ug | PREFILLED_SYRINGE | Freq: Once | INTRAMUSCULAR | Status: DC

## 2023-08-20 MED ORDER — FENTANYL BOLUS VIA INFUSION
50.0000 ug | INTRAVENOUS | Status: DC | PRN
  Administered 2023-08-22 (×2): 50 ug via INTRAVENOUS
  Administered 2023-08-22 – 2023-08-23 (×4): 100 ug via INTRAVENOUS
  Administered 2023-08-23 (×3): 50 ug via INTRAVENOUS
  Administered 2023-08-24 (×6): 100 ug via INTRAVENOUS

## 2023-08-20 MED ORDER — FENTANYL 2500MCG IN NS 250ML (10MCG/ML) PREMIX INFUSION
0.0000 ug/h | INTRAVENOUS | Status: DC
  Administered 2023-08-20 – 2023-08-22 (×2): 50 ug/h via INTRAVENOUS
  Administered 2023-08-23: 100 ug/h via INTRAVENOUS
  Administered 2023-08-24 (×2): 150 ug/h via INTRAVENOUS
  Filled 2023-08-20 (×5): qty 250

## 2023-08-20 NOTE — Progress Notes (Signed)
NAME:  Jill Barnes, MRN:  [id number], DOB:  [birth year], LOS: 1 ADMISSION DATE:  07/27/2023, CONSULTATION DATE:  08/12/2023 REFERRING MD:  Dr. Derry Lory - stroke, CHIEF COMPLAINT:  Vent management    History of Present Illness:  Jill Barnes is a 62 year old female with a past medical history significant for COPD, diabetes, smoker, remote history of DVT, and prior right MCA stroke with residual left-sided weakness who presented to the ED with left hemiplegia with neglect of left side and right gaze deviation.  Last known normal 2300 on 9/26, code stroke initiated.  CT head on admission concerning for right MCA territory infarct, CTA head and neck positive for large vessel occlusion resulting in emergent consult interventional IR for thrombectomy.  Patient decompensation post CT with hypoxia resulting in emergent intubation.  Post intubation carotid arteriogram was preformed for occluded right middle cerebral artery with complete revascularization but observed contrast extravasation on follow-up angio.  Concern for aspiration event prior to intubation given high vent requirements therefore postprocedure patient was left intubated and PCCM was consulted for further assistance in management.  Pertinent  Medical History  COPD, diabetes, smoker, remote history of DVT, and prior right MCA stroke with residual left-sided weakness   Significant Hospital Events: Including procedures, antibiotic start and stop dates in addition to other pertinent events   9/27 presented with left-sided hemiplegia and right gaze deviation in the setting of acute ischemic stroke.  Thrombectomy per IR, left intubated postprocedure 9/28 intermittent episodes of hypotension correlated to increased sedation requirement night.  Interim History / Subjective:  Sedated on vent this a.m. Remains on 80% FiO2 10 of PEEP  Objective   Blood pressure (!) 120/49, pulse (!) 104, temperature 98.1 F (36.7 C), resp. rate (!) 30,  height 5' 5.98" (1.676 m), weight 113.8 kg, SpO2 94%.    Vent Mode: PRVC FiO2 (%):  [80 %-90 %] 80 % Set Rate:  [24 bmp] 24 bmp Vt Set:  [460 mL] 460 mL PEEP:  [10 cmH20] 10 cmH20 Plateau Pressure:  [27 cmH20-30 cmH20] 27 cmH20   Intake/Output Summary (Last 24 hours) at 08/20/2023 4098 Last data filed at 08/20/2023 0900 Gross per 24 hour  Intake 2861.65 ml  Output 1925 ml  Net 936.65 ml   Filed Weights   08/16/2023 0300  Weight: 113.8 kg    Examination: General: Acute on chronic ill-appearing middle-aged female lying in bed on mechanical ventilation in no acute distress HEENT: [place]/AT, MM pink/moist, PERRL,  Neuro: Sedated on vent CV: s1s2 regular rate and rhythm, no murmur, rubs, or gallops,  PULM: Rhonchi bilaterally, no increased work of breathing, tolerating ventilator, 80% FiO2 10 of PEEP GI: soft, bowel sounds active in all 4 quadrants, non-tender, non-distended, tolerating TF Extremities: warm/dry, no edema  Skin: no rashes or lesions  Resolved Hospital Problem list     Assessment & Plan:  Acute right MCA stroke with right MCA M1 occlusion -S/p thrombectomy with post intervention contrast extravasation seen History of prior right MCA stroke with residual left-sided weakness P: Management per neurology Neuroprotective measures Nutrition and bowel regiment Secondary stroke prevention Aspiration precautions PT/OT/SLP as able  Acute Hypoxic Respiratory Failure  Concern for aspiration pneumonia History of COPD  Daily tobacco use  -Patient emergently intubated per EDP after decompensation post that head CT.  Per chart oxygen saturations dropped as low as 70 with dusky appearance. P: Continue ventilator support with lung protective strategies  Wean PEEP and FiO2 for sats greater than 90%. Head of  bed elevated 30 degrees. Plateau pressures less than 30 cm H20.  Follow intermittent chest x-ray and ABG.   SAT/SBT as tolerated, mentation preclude extubation  Ensure  adequate pulmonary hygiene  Follow cultures  VAP bundle in place  PAD protocol Continue empiric Unasyn Tobacco cessation education when appropriate  HFpEF -Echo Sep 12, 2023 EF 60 to 65% with no WMA and grade 2 diastolic dysfunction. P: Continuous telemetry  Statin Strict intake and output  Daily weight to assess volume status Daily assessment for need to diurese  Optimize electrolytes  History of diabetes -Home medications include NPH P: Continue sliding scale insulin, to be coverage, and long-acting insulin CBG checks every 4 hours CBG goal 140-180  CKD stage 3a -Review of renal function reveals GFR ranges between 58-52 since January 2023 P: Follow renal function  Monitor urine output Trend Bmet Avoid nephrotoxins Ensure adequate renal perfusion   Best Practice (right click and "Reselect all SmartList Selections" daily)   Diet/type: NPO DVT prophylaxis: SCD GI prophylaxis: PPI Lines: N/A Foley:  Yes, and it is still needed Code Status:  full code Last date of multidisciplinary goals of care discussion: Pending   Critical care time:   CRITICAL CARE Performed by: Jacalyn Biggs D. Harris  Total critical care time: 38 minutes  Critical care time was exclusive of separately billable procedures and treating other patients.  Critical care was necessary to treat or prevent imminent or life-threatening deterioration.  Critical care was time spent personally by me on the following activities: development of treatment plan with patient and/or surrogate as well as nursing, discussions with consultants, evaluation of patient's response to treatment, examination of patient, obtaining history from patient or surrogate, ordering and performing treatments and interventions, ordering and review of laboratory studies, ordering and review of radiographic studies, pulse oximetry and re-evaluation of patient's condition.  Precilla Purnell D. Harris, NP-C [facility] Personal  contact information can be found on Amion  If no contact or response made please call [phone] 08/20/2023, 9:29 AM

## 2023-08-20 NOTE — Plan of Care (Signed)
  Problem: Nutrition: Goal: Dietary intake will improve Outcome: Progressing   Problem: Nutritional: Goal: Maintenance of adequate nutrition will improve Outcome: Progressing    Tube feeds now at goal.

## 2023-08-20 NOTE — Progress Notes (Signed)
eLink Physician-Brief Progress Note Patient Name: MA MUNOZ DOB: 1961/07/11 MRN: [id number]   Date of Service  08/20/2023  HPI/Events of Note  62 year old patient in the ICU with acute CVA status post thrombectomy with subsequent hemorrhagic conversion of the stroke.  She is intubated and on invasive mechanical ventilation.  She has had 2 episodes of SVT with heart rate going as high as 205 but they have been short-lived and self-limiting.  She apparently had a similar episode last night.  Currently hemodynamically stable with heart rate 99 and blood pressure in the 120 systolic.  eICU Interventions  Will continue to monitor without intervention at this point.  Will eventually need EP evaluation after she has been stabilized.     Intervention Category Evaluation Type: Other (SVT)  Carilyn Goodpasture 08/20/2023, 9:40 PM

## 2023-08-20 NOTE — Progress Notes (Addendum)
STROKE TEAM PROGRESS NOTE   BRIEF HPI [name] is a 62 y.o. adult with history of smoking, COPD, DVT, diabetes, kidney stones and right MCA stroke presenting with sudden onset left hemiplegia right gaze deviation and left-sided neglect.  She was found to have a left MCA M1 occlusion.  She presented outside the TNK window, but was taken to IR for mechanical thrombectomy.  Revascularization was achieved with 1 pass.  She remains intubated postprocedure and has been acidotic and hypercarbic.  She likely aspirated at some point.  MRI today demonstrates right MCA stroke with some hemorrhagic transformation.   SIGNIFICANT HOSPITAL EVENTS 12-Sep-2023: Patient admitted with right MCA stroke, mechanical thrombectomy performed  INTERIM HISTORY/SUBJECTIVE Brother and sister in law are at the bedside. Pt still intubated on sedation. She is fighting with the vent and with dyssynchrony. Needed to upscale the sedation. Not candidate for extubation. CT repeat showed stable right MCA large infarct with large hemorrhagic conversion.     OBJECTIVE  CBC    Component Value Date/Time   WBC 13.4 (H) 08/20/2023 0740   RBC 3.08 (L) 08/20/2023 0740   HGB 10.2 (L) 08/20/2023 0809   HCT 30.0 (L) 08/20/2023 0809   PLT 188 08/20/2023 0740   MCV 104.5 (H) 08/20/2023 0740   MCH 32.8 08/20/2023 0740   MCHC 31.4 08/20/2023 0740   RDW 15.3 08/20/2023 0740   LYMPHSABS 1.3 08/20/2023 0740   MONOABS 0.9 08/20/2023 0740   EOSABS 0.0 08/20/2023 0740   BASOSABS 0.0 08/20/2023 0740    BMET    Component Value Date/Time   NA 142 08/20/2023 0809   K 4.0 08/20/2023 0809   CL 107 08/20/2023 0740   CO2 21 (L) 08/20/2023 0740   GLUCOSE 148 (H) 08/20/2023 0740   BUN 13 08/20/2023 0740   CREATININE 1.19 (H) 08/20/2023 0740   CALCIUM 7.6 (L) 08/20/2023 0740   GFRNONAA 52 (L) 08/20/2023 0740    IMAGING past 24 hours CT HEAD WO CONTRAST ( )  Result Date: 08/20/2023 CLINICAL DATA:  62 year old female code stroke  presentation on 2023-09-12 with right MCA occlusion. Status post NIR. EXAM: CT HEAD WITHOUT CONTRAST TECHNIQUE: Contiguous axial images were obtained from the base of the skull through the vertex without intravenous contrast. RADIATION DOSE REDUCTION: This exam was performed according to the departmental dose-optimization program which includes automated exposure control, adjustment of the mA and/or kV according to patient size and/or use of iterative reconstruction technique. COMPARISON:  Presentation head CT 09/12/2023. Post treatment brain MRI Sep 12, 2023. FINDINGS: Mild motion artifact. Brain: Hyperdense hemorrhage in the right hemisphere appears to be combined intra-axial and extra-axial blood. Small or at most moderate volume of right subarachnoid component, with multiple sulci affected. Intra-axial hematoma epicenter at the right insula and operculum similar to the MRI yesterday. By CT estimated intra-axial blood volume of 29 mL, 55 by 34 x 31 mm (AP by transverse by CC). Associated small volume IVH layering in both occipital horns. SAH also in the interpeduncular cistern. Underlying chronic ex vacuo ventricular enlargement due to pre-existing right hemisphere and left occipital lobe encephalomalacia. As by MRI no convincing midline shift or intracranial mass effect at this time. Basilar cisterns remain patent. Superimposed extensive bilateral patchy white matter hypodensity. No new areas of cytotoxic edema are identified. Vascular: Stable. Skull: Motion artifact. Intact calvarium. No acute osseous abnormality identified. Sinuses/Orbits: Intubated. Mild paranasal sinus opacification. Tympanic cavities and mastoids remain well aerated. Other: Intubated with partially visible but looped oral enteric tube. No  acute orbit or scalp soft tissue finding. IMPRESSION: 1. Combined intra-axial hematoma, 29 mL (right insula and operculum) and small to moderate volume right hemisphere subarachnoid hemorrhage, trace  intraventricular blood. Heidelberg classification 3c: Subarachnoid hemorrhage. 2. No significant midline shift or intracranial mass effect at this time. Underlying chronic encephalomalacia and ex vacuo ventricular enlargement. No new areas of cerebral ischemia are evident. Electronically Signed   By: Odessa Fleming M.D.   On: 08/20/2023 06:37   ECHOCARDIOGRAM COMPLETE  Result Date: 08/17/2023    ECHOCARDIOGRAM REPORT   Patient Name:   Jill Barnes Date of Exam: 07/30/2023 Medical Rec #:  [id number]        Height:       66.0 in Accession #:    [id number]       Weight:       250.9 lb Date of Birth:  04/17/1961        BSA:          2.202 m Patient Age:    62 years         BP:           114/51 mmHg Patient Gender: F                HR:           104 bpm. Exam Location:  Inpatient Procedure: Color Doppler and Cardiac Doppler Indications:    stroke  History:        Patient has prior history of Echocardiogram examinations, most                 recent 10/12/2020. CHF, COPD; Risk Factors:Current Smoker and                 Diabetes.  Sonographer:    Delcie Roch RDCS Referring Phys: [id number] [facility]  Sonographer Comments: Patient is obese and echo performed with patient supine and on artificial respirator. IMPRESSIONS  1. Left ventricular ejection fraction, by estimation, is 60 to 65%. The left ventricle has normal function. The left ventricle has no regional wall motion abnormalities. Left ventricular diastolic parameters are consistent with Grade II diastolic dysfunction (pseudonormalization). Elevated left ventricular end-diastolic pressure.  2. Right ventricular systolic function is normal. The right ventricular size is normal. There is normal pulmonary artery systolic pressure.  3. The mitral valve is abnormal. No evidence of mitral valve regurgitation. Moderate mitral stenosis. The mean mitral valve gradient is 10.0 mmHg.  4. The aortic valve is tricuspid. Aortic valve regurgitation is not visualized. No  aortic stenosis is present.  5. The inferior vena cava is dilated in size with >50% respiratory variability, suggesting right atrial pressure of 8 mmHg. FINDINGS  Left Ventricle: Left ventricular ejection fraction, by estimation, is 60 to 65%. The left ventricle has normal function. The left ventricle has no regional wall motion abnormalities. The left ventricular internal cavity size was normal in size. There is  no left ventricular hypertrophy. Left ventricular diastolic parameters are consistent with Grade II diastolic dysfunction (pseudonormalization). Elevated left ventricular end-diastolic pressure. Right Ventricle: The right ventricular size is normal. No increase in right ventricular wall thickness. Right ventricular systolic function is normal. There is normal pulmonary artery systolic pressure. The tricuspid regurgitant velocity is 2.23 m/s, and  with an assumed right atrial pressure of 8 mmHg, the estimated right ventricular systolic pressure is 27.9 mmHg. Left Atrium: Left atrial size was normal in size. Right Atrium: Right atrial size was normal in size. Pericardium: There is  no evidence of pericardial effusion. Mitral Valve: The mitral valve is abnormal. There is moderate thickening of the mitral valve leaflet(s). There is mild calcification of the mitral valve leaflet(s). No evidence of mitral valve regurgitation. Moderate mitral valve stenosis. MV peak gradient, 17.6 mmHg. The mean mitral valve gradient is 10.0 mmHg with average heart rate of 99 bpm. Tricuspid Valve: The tricuspid valve is normal in structure. Tricuspid valve regurgitation is mild . No evidence of tricuspid stenosis. Aortic Valve: The aortic valve is tricuspid. Aortic valve regurgitation is not visualized. No aortic stenosis is present. Aortic valve mean gradient measures 9.0 mmHg. Aortic valve peak gradient measures 17.8 mmHg. Aortic valve area, by VTI measures 1.96  cm. Pulmonic Valve: The pulmonic valve was normal in structure.  Pulmonic valve regurgitation is not visualized. No evidence of pulmonic stenosis. Aorta: The aortic root is normal in size and structure. Venous: The inferior vena cava is dilated in size with greater than 50% respiratory variability, suggesting right atrial pressure of 8 mmHg. IAS/Shunts: No atrial level shunt detected by color flow Doppler.  LEFT VENTRICLE PLAX 2D LVIDd:         4.80 cm   Diastology LVIDs:         3.00 cm   LV e' medial:    7.72 cm/s LV PW:         1.00 cm   LV E/e' medial:  24.4 LV IVS:        0.90 cm   LV e' lateral:   7.29 cm/s LVOT diam:     1.90 cm   LV E/e' lateral: 25.8 LV SV:         65 LV SV Index:   30 LVOT Area:     2.84 cm  RIGHT VENTRICLE             IVC RV Basal diam:  2.40 cm     IVC diam: 2.10 cm RV S prime:     14.50 cm/s TAPSE (M-mode): 1.8 cm LEFT ATRIUM             Index        RIGHT ATRIUM           Index LA diam:        4.00 cm 1.82 cm/m   RA Area:     11.50 cm LA Vol (A2C):   56.0 ml 25.43 ml/m  RA Volume:   24.00 ml  10.90 ml/m LA Vol (A4C):   53.8 ml 24.43 ml/m LA Biplane Vol: 56.3 ml 25.57 ml/m  AORTIC VALVE AV Area (Vmax):    1.80 cm AV Area (Vmean):   1.80 cm AV Area (VTI):     1.96 cm AV Vmax:           211.00 cm/s AV Vmean:          140.000 cm/s AV VTI:            0.333 m AV Peak Grad:      17.8 mmHg AV Mean Grad:      9.0 mmHg LVOT Vmax:         134.00 cm/s LVOT Vmean:        88.900 cm/s LVOT VTI:          0.230 m LVOT/AV VTI ratio: 0.69  AORTA Ao Root diam: 2.90 cm Ao Asc diam:  3.00 cm MITRAL VALVE                TRICUSPID VALVE MV  Area (PHT): 2.37 cm     TR Peak grad:   19.9 mmHg MV Area VTI:   1.37 cm     TR Vmax:        223.00 cm/s MV Peak grad:  17.6 mmHg MV Mean grad:  10.0 mmHg    SHUNTS MV Vmax:       2.10 m/s     Systemic VTI:  0.23 m MV Vmean:      148.0 cm/s   Systemic Diam: 1.90 cm MV Decel Time: 320 msec MV E velocity: 188.00 cm/s MV A velocity: 172.00 cm/s MV E/A ratio:  1.09 Chilton Si MD Electronically signed by Chilton Si MD  Signature Date/Time: 08/11/2023/4:08:41 PM    Final    MR BRAIN WO CONTRAST  Result Date: 08/12/2023 CLINICAL DATA:  Neuro deficit, acute, stroke suspected. Status post revascularization of right M1 occlusion today. EXAM: MRI HEAD WITHOUT CONTRAST TECHNIQUE: Multiplanar, multiecho pulse sequences of the brain and surrounding structures were obtained without intravenous contrast. COMPARISON:  Head CT 08/12/2023 and MRI 10/12/2020. Cerebral angiography 08/20/2023. FINDINGS: The study is intermittently mildly to moderately motion degraded. Brain: There is an acute right MCA infarct centered at the level of the insula and frontal operculum with involvement of portions of the basal ganglia and corona radiata as well. There is an approximately 4 x 2 cm hemorrhage at the level of the insula/frontal operculum. A smaller amount of subarachnoid hemorrhage and extravasated contrast were present in this region on the CT obtained within the angiography suite following revascularization. Scattered subarachnoid hemorrhage is present elsewhere in cerebral sulci bilaterally, and there is also small volume hemorrhage in the lateral, third, and fourth ventricles. There is mild mass effect at the level of the right basal ganglia without midline shift. Scattered small acute cortical and subcortical infarcts are present elsewhere in the frontal and parietal lobes bilaterally. There is a moderate-sized chronic infarct in the posterior right MCA territory, and there is a small chronic infarct in the left periatrial white matter (with these infarcts being recent on the prior MRI). A small chronic left occipital infarct is new from the prior MRI. There are small chronic bilateral cerebellar infarcts. Patchy T2 hyperintensities elsewhere in the cerebral white matter bilaterally are nonspecific but compatible with severely age advanced chronic small vessel ischemic disease with mild interval progression from the prior MRI. There is mild  cerebral atrophy. Vascular: Major intracranial vascular flow voids are grossly preserved. Skull and upper cervical spine: No suspicious marrow lesion. Sinuses/Orbits: Unremarkable orbits. Small mucous retention cyst in the left maxillary sinus. Small volume fluid in the sphenoid sinuses and in a posterior right ethmoid air cell. No significant mastoid fluid. Other: None. The inpatient neurology service is aware of the hemorrhagic transformation. IMPRESSION: 1. Acute right MCA infarct with hemorrhagic transformation including a 4 cm hemorrhage at the level of the insula/frontal operculum, subarachnoid hemorrhage, and small volume intraventricular hemorrhage. 2. Scattered small acute left MCA infarcts. 3. Severe chronic small vessel ischemic disease with multiple chronic infarcts as above. Electronically Signed   By: Sebastian Ache M.D.   On: 08/18/2023 15:54   DG Abd 1 View  Result Date: 08/13/2023 CLINICAL DATA:  OG tube placement EXAM: ABDOMEN - 1 VIEW COMPARISON:  CT 10/26/2022 FINDINGS: Enteric tube tip at the mid stomach. Upper gas pattern is unremarkable. Patchy airspace disease left lung base. IMPRESSION: Enteric tube tip at the mid stomach. Electronically Signed   By: Jasmine Pang M.D.   On: 07/24/2023  15:17    Vitals:   08/20/23 0830 08/20/23 0845 08/20/23 0900 08/20/23 0915  BP: (!) 107/58 112/64 107/60 (!) 120/49  Pulse: 86 90 92 (!) 104  Resp: 17 17 (!) 27 (!) 30  Temp: 97.7 F (36.5 C) 97.9 F (36.6 C) 97.9 F (36.6 C) 98.1 F (36.7 C)  SpO2: 98% 98% (!) 89% 94%  Weight:      Height:         PHYSICAL EXAM  Temp:  [95 F (35 C)-99.1 F (37.3 C)] 98.1 F (36.7 C) (09/28 0915) Pulse Rate:  [85-115] 104 (09/28 0915) Resp:  [17-30] 30 (09/28 0915) BP: (99-163)/(45-120) 120/49 (09/28 0915) SpO2:  [89 %-98 %] 94 % (09/28 0915) Arterial Line BP: (72-164)/(45-91) 91/60 (09/28 0030) FiO2 (%):  [80 %-90 %] 80 % (09/28 0747)  General - Well nourished, well developed, intubated on  sedation.  Ophthalmologic - fundi not visualized due to noncooperation.  Cardiovascular - Regular rate and rhythm.  Neuro - intubated on sedation, eyes closed, barely open on voice, not following commands. With forced eye opening, eyes in mid position, not blinking to visual threat, doll's eyes present, not tracking, PERRL. Corneal reflex present on the right but absent on the left, gag and cough present. Breathing over the vent.  Facial symmetry not able to test due to ET tube.  Tongue protrusion not cooperative. On pain stimulation, LUE and LLE flaccid. RUE and RLE spontaneous movement. Right positive babinski. Sensation, coordination and gait not tested.   ASSESSMENT/PLAN  Stroke: right MCA large and left MCA/ACA punctate infarcts with right M1 occlusion s/p IR with TICI3, complicated with large hemorrhagic conversion, etiology: Large vessel occlusion vs. Cardioembolic source Code Stroke CT head subtly decreased attenuation at level of right frontal operculum, suspicious for right MCA territory infarct ASPECTS 9 CTA head & neck occlusion of distal right M1 segment, carotid stenosis of 65 to 70% on the left and 30 to 40% on the right  MRI right MCA territory infarct with some hemorrhagic transformation, additional punctate infarcts in left MCA/ACA territory CT repeat 9/28 Combined intra-axial hematoma, 29 mL (right insula and operculum) and small to moderate volume right hemisphere subarachnoid hemorrhage, trace intraventricular blood. 2D Echo EF 60-65% LE venous doppler pending May consider loop recorder if significant neuro improvement Direct LDL 59, TG 416 HgbA1c 7.5 UDS neg VTE prophylaxis - SCDs aspirin 81 mg daily prior to admission, now on No antithrombotic given hemorrhagic transformation. Therapy recommendations: Pending Disposition: Pending  Respiratory failure and aspiration pneumonia Patient left intubated postprocedure Concern for aspiration event prior to intubation High  vent settings preclude possibility of extubation or positive sedation for exam Leukocytosis WBC 13.4 Unasyn for possible aspiration pneumonia Ventilator management per CCM  Hx of Stroke/TIA 09/2020 admitted for right M2 occlusion, CT and MRI showed moderate to large right MCA infarct, as well as left parietal, right cerebellum, right splenium small infarcts. CTA head and neck showed right M2 occlusion, b/l ICA 60% stenosis L>R. EF 60-65%, LDL 125 and A1C 9.1. discharged on DAPT and lipitor 20.  Hx of PE and DVT  Occurred in 06/2020 due to COVID pneumonia  This admission CTA no PE LE venous doppler pending  Hypertension Home meds: Lisinopril 5 mg daily Unstable, requiring Cleviprex and levophed Now BP goal 120-160 Long term BP goal normotensive  Hyperlipidemia Home meds: Atorvastatin 20 mg daily, resumed in hospital LDL 59, goal < 70 No high intensity statin due to LDL at goal Continue statin at  discharge  Diabetes type II Uncontrolled Home meds: Insulin NPH 130 units in the morning and 35 units in the evening HgbA1c 7.5, goal < 7.0 CBGs SSI Now on insulin Recommend close follow-up with PCP for better DM control  Tobacco Abuse Patient smokes 1 packs per day Will discuss nicotine replacement therapy when patient is extubated  Dysphagia Now intubated has OG Currently NPO On TF @ 50  Other Stroke Risk Factors Obesity, Body mass index is 40.51 kg/m., BMI >/= 30 associated with increased stroke risk, recommend weight loss, diet and exercise as appropriate  Migraines  Other Active Problems 1.1 cm left parotid lesion-recommend outpatient ENT follow-up AKI 1.17->1.19  Hospital day # 1  This patient is critically ill due to right MCA infarct with hemorrhagic conversion, respiratory failure, uncontrolled DM, dysphagia and at significant risk of neurological worsening, death form recurrent stroke, hematoma expansion, DKA, aspiration, sepsis. This patient's care requires  constant monitoring of vital signs, hemodynamics, respiratory and cardiac monitoring, review of multiple databases, neurological assessment, discussion with family, other specialists and medical decision making of high complexity. I spent 40 minutes of neurocritical care time in the care of this patient. I had long discussion with brother at bedside, updated pt current condition, treatment plan and potential prognosis, and answered all the questions. He expressed understanding and appreciation. Also discussed with CCM Dr. Kennis Carina, MD PhD Stroke Neurology 08/20/2023 9:31 PM    To contact Stroke Continuity provider, please refer to [email or web address]. After hours, contact General Neurology

## 2023-08-20 NOTE — Progress Notes (Signed)
Arterial line removed by RN at 0033.

## 2023-08-20 NOTE — Progress Notes (Signed)
PT Cancellation Note  Patient Details Name: Jill Barnes MRN: [id number] DOB: 1961/08/07   Cancelled Treatment:    Reason Eval/Treat Not Completed: Medical issues which prohibited therapy, per discussion with RN, pt remains intubated and sedated. Will continue to follow and evaluate as appropriate.   Vickki Muff, PT, DPT   Acute Rehabilitation Department Office [phone] Secure Chat Communication Preferred   Ronnie Derby 08/20/2023, 1:28 PM

## 2023-08-20 NOTE — Progress Notes (Signed)
SLP Cancellation Note  Patient Details Name: Jill Barnes MRN: [id number] DOB: 1961/06/01   Cancelled treatment:        Orders for cognitive-linguistic assessment received and appreciated.  Pt continues to require ventilator support and is not appropriate for assessment at this time. RN reports no plans for extubation today.  SLP will continue to follow for medical readiness.  Consider swallowing evaluation orders post extubation if indicated.  Kerrie Pleasure, MA, CCC-SLP Acute Rehabilitation Services Office: [phone] 08/20/2023, 9:12 AM

## 2023-08-21 ENCOUNTER — Inpatient Hospital Stay (HOSPITAL_COMMUNITY): Payer: Medicare Other

## 2023-08-21 ENCOUNTER — Encounter (HOSPITAL_COMMUNITY): Payer: Self-pay | Admitting: Interventional Radiology

## 2023-08-21 DIAGNOSIS — T17908A Unspecified foreign body in respiratory tract, part unspecified causing other injury, initial encounter: Secondary | ICD-10-CM

## 2023-08-21 DIAGNOSIS — Z86711 Personal history of pulmonary embolism: Secondary | ICD-10-CM

## 2023-08-21 LAB — CBC WITH DIFFERENTIAL/PLATELET
Abs Immature Granulocytes: 0.13 10*3/uL — ABNORMAL HIGH (ref 0.00–0.07)
Basophils Absolute: 0 10*3/uL (ref 0.0–0.1)
Basophils Relative: 0 %
Eosinophils Absolute: 0 10*3/uL (ref 0.0–0.5)
Eosinophils Relative: 0 %
HCT: 30.6 % — ABNORMAL LOW (ref 36.0–46.0)
Hemoglobin: 9.5 g/dL — ABNORMAL LOW (ref 12.0–15.0)
Immature Granulocytes: 1 %
Lymphocytes Relative: 14 %
Lymphs Abs: 1.5 10*3/uL (ref 0.7–4.0)
MCH: 33.2 pg (ref 26.0–34.0)
MCHC: 31 g/dL (ref 30.0–36.0)
MCV: 107 fL — ABNORMAL HIGH (ref 80.0–100.0)
Monocytes Absolute: 1.1 10*3/uL — ABNORMAL HIGH (ref 0.1–1.0)
Monocytes Relative: 10 %
Neutro Abs: 8 10*3/uL — ABNORMAL HIGH (ref 1.7–7.7)
Neutrophils Relative %: 75 %
Platelets: 158 10*3/uL (ref 150–400)
RBC: 2.86 MIL/uL — ABNORMAL LOW (ref 3.87–5.11)
RDW: 15.8 % — ABNORMAL HIGH (ref 11.5–15.5)
WBC: 10.7 10*3/uL — ABNORMAL HIGH (ref 4.0–10.5)
nRBC: 0.3 % — ABNORMAL HIGH (ref 0.0–0.2)

## 2023-08-21 LAB — PHOSPHORUS: Phosphorus: 3.2 mg/dL (ref 2.5–4.6)

## 2023-08-21 LAB — BASIC METABOLIC PANEL
Anion gap: 10 (ref 5–15)
BUN: 30 mg/dL — ABNORMAL HIGH (ref 8–23)
CO2: 22 mmol/L (ref 22–32)
Calcium: 7.8 mg/dL — ABNORMAL LOW (ref 8.9–10.3)
Chloride: 105 mmol/L (ref 98–111)
Creatinine, Ser: 2.05 mg/dL — ABNORMAL HIGH (ref 0.44–1.00)
GFR, Estimated: 27 mL/min — ABNORMAL LOW (ref >60)
Glucose, Bld: 187 mg/dL — ABNORMAL HIGH (ref 70–99)
Potassium: 4.4 mmol/L (ref 3.5–5.1)
Sodium: 137 mmol/L (ref 135–145)

## 2023-08-21 LAB — GLUCOSE, CAPILLARY
Glucose-Capillary: 166 mg/dL — ABNORMAL HIGH (ref 70–99)
Glucose-Capillary: 211 mg/dL — ABNORMAL HIGH (ref 70–99)
Glucose-Capillary: 247 mg/dL — ABNORMAL HIGH (ref 70–99)
Glucose-Capillary: 219 mg/dL — ABNORMAL HIGH (ref 70–99)
Glucose-Capillary: 281 mg/dL — ABNORMAL HIGH (ref 70–99)

## 2023-08-21 LAB — TRIGLYCERIDES: Triglycerides: 240 mg/dL — ABNORMAL HIGH (ref <150)

## 2023-08-21 LAB — MAGNESIUM: Magnesium: 2.5 mg/dL — ABNORMAL HIGH (ref 1.7–2.4)

## 2023-08-21 MED ORDER — SODIUM CHLORIDE 3 % IN NEBU
4.0000 mL | INHALATION_SOLUTION | Freq: Two times a day (BID) | RESPIRATORY_TRACT | Status: DC
  Administered 2023-08-21 – 2023-08-23 (×5): 4 mL via RESPIRATORY_TRACT
  Filled 2023-08-21 (×5): qty 4

## 2023-08-21 MED ORDER — FUROSEMIDE 10 MG/ML IJ SOLN
40.0000 mg | Freq: Once | INTRAMUSCULAR | Status: AC
  Administered 2023-08-21: 40 mg via INTRAVENOUS
  Filled 2023-08-21: qty 4

## 2023-08-21 MED ORDER — HEPARIN SODIUM (PORCINE) 5000 UNIT/ML IJ SOLN
5000.0000 [IU] | Freq: Three times a day (TID) | INTRAMUSCULAR | Status: DC
  Administered 2023-08-21 – 2023-08-24 (×8): 5000 [IU] via SUBCUTANEOUS
  Filled 2023-08-21 (×8): qty 1

## 2023-08-21 NOTE — Progress Notes (Addendum)
STROKE TEAM PROGRESS NOTE   BRIEF HPI [name] is a 62 y.o. adult with history of smoking, COPD, DVT, diabetes, kidney stones and right MCA stroke presenting with sudden onset left hemiplegia right gaze deviation and left-sided neglect.  She was found to have a right MCA M1 occlusion.  She presented outside the TNK window, but was taken to IR for mechanical thrombectomy.  Revascularization was achieved with 1 pass.  She remains intubated postprocedure and has been acidotic and hypercarbic.  She likely aspirated at some point.  MRI today demonstrates right MCA stroke with some hemorrhagic transformation.   SIGNIFICANT HOSPITAL EVENTS 09/04/23: Patient admitted with right MCA stroke, mechanical thrombectomy performed  INTERIM HISTORY/SUBJECTIVE Patient is seen in her room with 1 family member at the bedside.  She remains intubated on sedation with propofol and fentanyl.  Secretions have increased overnight, and hypertonic saline nebs were started by CCM.  She continues on Unasyn for aspiration pneumonia.  Will check follow-up head CT in the morning.   OBJECTIVE  CBC    Component Value Date/Time   WBC 10.7 (H) 08/21/2023 0704   RBC 2.86 (L) 08/21/2023 0704   HGB 9.5 (L) 08/21/2023 0704   HCT 30.6 (L) 08/21/2023 0704   PLT 158 08/21/2023 0704   MCV 107.0 (H) 08/21/2023 0704   MCH 33.2 08/21/2023 0704   MCHC 31.0 08/21/2023 0704   RDW 15.8 (H) 08/21/2023 0704   LYMPHSABS 1.5 08/21/2023 0704   MONOABS 1.1 (H) 08/21/2023 0704   EOSABS 0.0 08/21/2023 0704   BASOSABS 0.0 08/21/2023 0704    BMET    Component Value Date/Time   NA 137 08/21/2023 0704   K 4.4 08/21/2023 0704   CL 105 08/21/2023 0704   CO2 22 08/21/2023 0704   GLUCOSE 187 (H) 08/21/2023 0704   BUN 30 (H) 08/21/2023 0704   CREATININE 2.05 (H) 08/21/2023 0704   CALCIUM 7.8 (L) 08/21/2023 0704   GFRNONAA 27 (L) 08/21/2023 0704    IMAGING past 24 hours No results found.  Vitals:   08/21/23 1000 08/21/23 1011  08/21/23 1133 08/21/23 1200  BP: 122/69   (!) 111/58  Pulse: 86   82  Resp: (!) 24   (!) 24  Temp: 98.6 F (37 C)   99.1 F (37.3 C)  TempSrc:      SpO2: 94% 95% 94% 94%  Weight:      Height:         PHYSICAL EXAM  Temp:  [98.6 F (37 C)-100.2 F (37.9 C)] 99.1 F (37.3 C) (09/29 1200) Pulse Rate:  [82-112] 82 (09/29 1200) Resp:  [0-32] 24 (09/29 1200) BP: (111-140)/(57-77) 111/58 (09/29 1200) SpO2:  [88 %-100 %] 94 % (09/29 1200) FiO2 (%):  [40 %-60 %] 40 % (09/29 1133)  General - Well nourished, well developed, intubated on sedation.  Ophthalmologic - fundi not visualized due to noncooperation.  Cardiovascular - Regular rate and rhythm.  Neuro - intubated on sedation, eyes closed, eyes do not open to voice, touch or noxious stimuli.  She is unable to follow commands.  Pupils are pinpoint, no doll's eyes reflex, positive cough and gag, will withdraw right upper and lower extremities to noxious stimuli but does not move left upper and lower extremities   ASSESSMENT/PLAN  Stroke: right MCA large and left MCA/ACA punctate infarcts with right M1 occlusion s/p IR with TICI3, complicated with large hemorrhagic conversion, etiology: Large vessel occlusion vs. Cardioembolic source Code Stroke CT head subtly decreased  attenuation at level of right frontal operculum, suspicious for right MCA territory infarct ASPECTS 9 CTA head & neck occlusion of distal right M1 segment, carotid stenosis of 65 to 70% on the left and 30 to 40% on the right  MRI right MCA territory infarct with some hemorrhagic transformation, additional punctate infarcts in left MCA/ACA territory CT repeat 9/28 Combined intra-axial hematoma, 29 mL (right insula and operculum) and small to moderate volume right hemisphere subarachnoid hemorrhage, trace intraventricular blood. CT repeat 9/29 pending 2D Echo EF 60-65% LE venous doppler no DVT May consider loop recorder if significant neuro improvement Direct LDL 59,  TG 416 HgbA1c 7.5 UDS neg VTE prophylaxis - heparin subq aspirin 81 mg daily prior to admission, now on No antithrombotic now given significant hemorrhagic transformation. Therapy recommendations: Pending Disposition: Pending  Respiratory failure and aspiration pneumonia Patient left intubated postprocedure Concern for aspiration event prior to intubation High vent settings preclude possibility of extubation or positive sedation for exam Leukocytosis WBC 13.4->10.7 Unasyn for possible aspiration pneumonia Ventilator management per CCM  Hx of Stroke/TIA 09/2020 admitted for right M2 occlusion, CT and MRI showed moderate to large right MCA infarct, as well as left parietal, right cerebellum, right splenium small infarcts. CTA head and neck showed right M2 occlusion, b/l ICA 60% stenosis L>R. EF 60-65%, LDL 125 and A1C 9.1. discharged on DAPT and lipitor 20.  Hx of PE and DVT  Occurred in 06/2020 due to COVID pneumonia  This admission CTA no PE LE venous doppler negative  History of hypertension now hypotensive Home meds: Lisinopril 5 mg daily Unstable, requiring levophed Now BP goal 120-160 Long term BP goal normotensive  Hyperlipidemia Home meds: Atorvastatin 20 mg daily  LDL 59, goal < 70 On lipitor 20 No high intensity statin due to LDL at goal Continue statin at discharge  Diabetes type II Uncontrolled Home meds: Insulin NPH 130 units in the morning and 35 units in the evening HgbA1c 7.5, goal < 7.0 CBGs SSI Now on insulin Recommend close follow-up with PCP for better DM control  Tobacco Abuse Patient smokes 1 packs per day Will discuss nicotine replacement therapy when patient is extubated  Dysphagia Now intubated has OG Currently NPO On TF @ 50  Other Stroke Risk Factors Obesity, Body mass index is 40.51 kg/m., BMI >/= 30 associated with increased stroke risk, recommend weight loss, diet and exercise as appropriate  Migraines  Other Active Problems 1.1  cm left parotid lesion-recommend outpatient ENT follow-up AKI 1.17->1.19-> 2.05 - management per [facility] day # 2  Patient seen by NP with MD, MD to edit note as needed. Cortney E Ernestina Columbia , MSN, AGACNP-BC [facility] See Amion for schedule and pager information 08/21/2023 12:28 PM  ATTENDING NOTE: I reviewed above note and agree with the assessment and plan. Pt was seen and examined.   Pt husband, son and daughter are at the bedside. Pt still intubated on vent and sedation, with sedation off, pt slightly open eyes on voice ,but not following commands. Pupils small and not reactive. Doll's eyes sluggish, absent corneal on the left but positive on the right. Cough and gag positive. Still has left hemiplegia and spontaneous movement of right UE and LE. CT repeat pending.   Leukocytosis improving, afebile but more secretions than yesterday. On Abx now. Management per CCM. On statin and off antithrombotics due to HT. I had long discussion with husband, son and daughter at bedside, updated pt current condition, treatment plan  and potential poor prognosis, and answered all the questions. They expressed understanding and appreciation. Will continue current treatment and GOC discussion. Family stated that pt would not want artifical means for prolonging life and without quality.   For detailed assessment and plan, please refer to above/below as I have made changes wherever appropriate.   Marvel Plan, MD PhD Stroke Neurology 08/21/2023 6:33 PM  This patient is critically ill due to right MCA infarct with hemorrhagic conversion, respiratory failure, uncontrolled DM, dysphagia and at significant risk of neurological worsening, death form recurrent stroke, hematoma expansion, DKA, aspiration, sepsis. This patient's care requires constant monitoring of vital signs, hemodynamics, respiratory and cardiac monitoring, review of multiple databases, neurological assessment, discussion with  family, other specialists and medical decision making of high complexity. I spent 40 minutes of neurocritical care time in the care of this patient.    To contact Stroke Continuity provider, please refer to [email or web address]. After hours, contact General Neurology

## 2023-08-21 NOTE — Progress Notes (Signed)
Patient with decreasing urine output overnight and this morning. Bun and Cr trending up. CCM aware, Lasix ordered with no improvement. Catheter flushed without difficulty. Bladder scan 0cc. CCM aware, no further orders at this time.

## 2023-08-21 NOTE — Evaluation (Signed)
SLP Cancellation Note  Patient Details Name: Jill Barnes MRN: [id number] DOB: [birth year]   Cancelled treatment:       Reason Eval/Treat Not Completed: Other (comment) (pt remains intubated, will continue efforts)  Rolena Infante, MS [facility] SLP Acute Rehab Services Office [phone]   Chales Abrahams 08/21/2023, 7:03 AM

## 2023-08-21 NOTE — Progress Notes (Signed)
VASCULAR LAB    Bilateral lower extremity venous duplex has been performed.  See CV proc for preliminary results.   Pachia Strum, RVT 08/21/2023, 2:06 PM

## 2023-08-21 NOTE — Plan of Care (Signed)
  Problem: Nutrition: Goal: Risk of aspiration will decrease Outcome: Progressing Goal: Dietary intake will improve Outcome: Progressing   Problem: Nutritional: Goal: Maintenance of adequate nutrition will improve Outcome: Progressing   Problem: Skin Integrity: Goal: Risk for impaired skin integrity will decrease Outcome: Progressing

## 2023-08-21 NOTE — Progress Notes (Signed)
Patient transported to CT and back without complications. RN at bedside.  

## 2023-08-21 NOTE — Progress Notes (Addendum)
NAME:  Jill Barnes, MRN:  [id number], DOB:  1961/01/07, LOS: 2 ADMISSION DATE:  September 18, 2023, CONSULTATION DATE:  09-18-23 REFERRING MD:  Dr. Derry Lory - stroke, CHIEF COMPLAINT:  Vent management    History of Present Illness:  Jill Barnes is a 62 year old female with a past medical history significant for COPD, diabetes, smoker, remote history of DVT, and prior right MCA stroke with residual left-sided weakness who presented to the ED with left hemiplegia with neglect of left side and right gaze deviation.  Last known normal 2300 on 9/26, code stroke initiated.  CT head on admission concerning for right MCA territory infarct, CTA head and neck positive for large vessel occlusion resulting in emergent consult interventional IR for thrombectomy.  Patient decompensation post CT with hypoxia resulting in emergent intubation.  Post intubation carotid arteriogram was preformed for occluded right middle cerebral artery with complete revascularization but observed contrast extravasation on follow-up angio.  Concern for aspiration event prior to intubation given high vent requirements therefore postprocedure patient was left intubated and PCCM was consulted for further assistance in management.  Pertinent  Medical History  COPD, diabetes, smoker, remote history of DVT, and prior right MCA stroke with residual left-sided weakness   Significant Hospital Events: Including procedures, antibiotic start and stop dates in addition to other pertinent events   2023/09/18 presented with left-sided hemiplegia and right gaze deviation in the setting of acute ischemic stroke.  Thrombectomy per IR, left intubated postprocedure 9/28 intermittent episodes of hypotension correlated to increased sedation requirement night.  Interim History / Subjective:  No acute events overnight Remains of vent and off pressors UOP down some from yesterday  Objective   Blood pressure 135/63, pulse 91, temperature 99.3 F (37.4 C),  resp. rate (!) 25, height 5' 5.98" (1.676 m), weight 113.8 kg, SpO2 95%.    Vent Mode: PRVC FiO2 (%):  [50 %-70 %] 50 % Set Rate:  [24 bmp] 24 bmp Vt Set:  [460 mL] 460 mL PEEP:  [8 cmH20] 8 cmH20 Plateau Pressure:  [27 cmH20-32 cmH20] 32 cmH20   Intake/Output Summary (Last 24 hours) at 08/21/2023 0758 Last data filed at 08/21/2023 0700 Gross per 24 hour  Intake 3172.11 ml  Output 650 ml  Net 2522.11 ml   Filed Weights   2023-09-18 0300  Weight: 113.8 kg    Examination: General: Elderly female on vent in mild distress due to sedation being off.  HEENT: Jill Barnes/AT, PERRL, no JVD Neuro: Does not open eyes or follow commands. Localizes in the right upper and moves L lower spontaneously but non-purposefully.  CV: RRR, no MRG PULM: Bibasilar rhonchi GI: Soft, NT, ND Extremities: No acute deformity. No sig edema.  Skin: no rashes or lesions  Sig labs: Glucose 187, Creat 2.05, Trig 240, Hgb 9.5  Resolved Hospital Problem list     Assessment & Plan:  Acute right MCA stroke with right MCA M1 occlusion -S/p thrombectomy with post intervention contrast extravasation seen History of prior right MCA stroke with residual left-sided weakness P: Management per neurology Neuroprotective measures Nutrition and bowel regiment Secondary stroke prevention Aspiration precautions PT/OT/SLP as able  Acute Hypoxic Respiratory Failure  Concern for aspiration pneumonia History of COPD  Daily tobacco use  -Patient emergently intubated per EDP after decompensation post that head CT.  Per chart oxygen saturations dropped as low as 70 with dusky appearance. P: Continue ventilator support with lung protective strategies  Wean PEEP and FiO2 for sats greater than 90%. Not an SBT  candidate at this time due to AMS and vent requirements Copious secretions. Send sputum culture. Hypertonic nebs. Chest PT.  Desats quickly with arousal VAP bundle in place  PAD protocol - propofol and fentanyl Continue  empiric Unasyn Tobacco cessation education when appropriate  HFpEF -Echo 9/27 EF 60 to 65% with no WMA and grade 2 diastolic dysfunction. P: Continuous telemetry  Statin Strict intake and output  Daily weight to assess volume status Optimize electrolytes  History of diabetes -Home medications include NPH P: Continue sliding scale insulin, to be coverage, and long-acting insulin CBG checks every 4 hours CBG goal 140-180  Anemia: continues to drift downward. Hemoglobin 13 on 9/27 and now 9.5. - Send hemoccult - Repeat coags - Transfuse per usual ICU guidelines  CKD stage 3a -Review of renal function reveals GFR ranges between 58-52 since January 2023 P: Follow renal function - labs look a little worse today Monitor urine output- Has dropped off some Trend Bmet May need to diuresis.    Best Practice (right click and "Reselect all SmartList Selections" daily)   Diet/type: NPO DVT prophylaxis: SCD GI prophylaxis: PPI Lines: N/A Foley:  Yes, and it is still needed Code Status:  full code Last date of multidisciplinary goals of care discussion: Pending   Critical care time: 39 mins   Joneen Roach, AGACNP-BC [facility]  See Amion for personal pager PCCM on call pager [phone] until 7pm. Please call Elink 7p-7a. [phone]  08/21/2023 8:13 AM

## 2023-08-21 NOTE — Plan of Care (Signed)
  Problem: Nutrition: Goal: Risk of aspiration will decrease 08/21/2023 1806 by Alycia Rossetti, RN Outcome: Progressing 08/21/2023 1627 by Alycia Rossetti, RN Outcome: Progressing Goal: Dietary intake will improve 08/21/2023 1806 by Alycia Rossetti, RN Outcome: Progressing 08/21/2023 1627 by Alycia Rossetti, RN Outcome: Progressing   Problem: Nutritional: Goal: Maintenance of adequate nutrition will improve Outcome: Progressing   Problem: Skin Integrity: Goal: Risk for impaired skin integrity will decrease Outcome: Progressing

## 2023-08-22 LAB — CBC WITH DIFFERENTIAL/PLATELET
Abs Immature Granulocytes: 0.18 10*3/uL — ABNORMAL HIGH (ref 0.00–0.07)
Basophils Absolute: 0.1 10*3/uL (ref 0.0–0.1)
Basophils Relative: 1 %
Eosinophils Absolute: 0.1 10*3/uL (ref 0.0–0.5)
Eosinophils Relative: 1 %
HCT: 31.7 % — ABNORMAL LOW (ref 36.0–46.0)
Hemoglobin: 9.7 g/dL — ABNORMAL LOW (ref 12.0–15.0)
Immature Granulocytes: 2 %
Lymphocytes Relative: 16 %
Lymphs Abs: 1.6 10*3/uL (ref 0.7–4.0)
MCH: 32.8 pg (ref 26.0–34.0)
MCHC: 30.6 g/dL (ref 30.0–36.0)
MCV: 107.1 fL — ABNORMAL HIGH (ref 80.0–100.0)
Monocytes Absolute: 0.8 10*3/uL (ref 0.1–1.0)
Monocytes Relative: 8 %
Neutro Abs: 7.2 10*3/uL (ref 1.7–7.7)
Neutrophils Relative %: 72 %
Platelets: 158 10*3/uL (ref 150–400)
RBC: 2.96 MIL/uL — ABNORMAL LOW (ref 3.87–5.11)
RDW: 15.7 % — ABNORMAL HIGH (ref 11.5–15.5)
WBC: 9.8 10*3/uL (ref 4.0–10.5)
nRBC: 0.6 % — ABNORMAL HIGH (ref 0.0–0.2)

## 2023-08-22 LAB — BASIC METABOLIC PANEL
Anion gap: 11 (ref 5–15)
BUN: 50 mg/dL — ABNORMAL HIGH (ref 8–23)
CO2: 21 mmol/L — ABNORMAL LOW (ref 22–32)
Calcium: 7.8 mg/dL — ABNORMAL LOW (ref 8.9–10.3)
Chloride: 102 mmol/L (ref 98–111)
Creatinine, Ser: 4.16 mg/dL — ABNORMAL HIGH (ref 0.44–1.00)
GFR, Estimated: 12 mL/min — ABNORMAL LOW (ref >60)
Glucose, Bld: 252 mg/dL — ABNORMAL HIGH (ref 70–99)
Potassium: 4.9 mmol/L (ref 3.5–5.1)
Sodium: 134 mmol/L — ABNORMAL LOW (ref 135–145)

## 2023-08-22 LAB — TRIGLYCERIDES: Triglycerides: 244 mg/dL — ABNORMAL HIGH (ref <150)

## 2023-08-22 LAB — GLUCOSE, CAPILLARY
Glucose-Capillary: 237 mg/dL — ABNORMAL HIGH (ref 70–99)
Glucose-Capillary: 246 mg/dL — ABNORMAL HIGH (ref 70–99)
Glucose-Capillary: 247 mg/dL — ABNORMAL HIGH (ref 70–99)
Glucose-Capillary: 249 mg/dL — ABNORMAL HIGH (ref 70–99)
Glucose-Capillary: 250 mg/dL — ABNORMAL HIGH (ref 70–99)
Glucose-Capillary: 262 mg/dL — ABNORMAL HIGH (ref 70–99)
Glucose-Capillary: 271 mg/dL — ABNORMAL HIGH (ref 70–99)

## 2023-08-22 MED ORDER — INSULIN GLARGINE-YFGN 100 UNIT/ML ~~LOC~~ SOLN
25.0000 [IU] | Freq: Every day | SUBCUTANEOUS | Status: DC
  Filled 2023-08-22: qty 0.25

## 2023-08-22 MED ORDER — INSULIN ASPART 100 UNIT/ML IJ SOLN
7.0000 [IU] | INTRAMUSCULAR | Status: DC
  Administered 2023-08-22 – 2023-08-24 (×12): 7 [IU] via SUBCUTANEOUS

## 2023-08-22 MED ORDER — FUROSEMIDE 10 MG/ML IJ SOLN
40.0000 mg | Freq: Once | INTRAMUSCULAR | Status: AC
  Administered 2023-08-22: 40 mg via INTRAVENOUS
  Filled 2023-08-22: qty 4

## 2023-08-22 MED ORDER — SODIUM CHLORIDE 0.9 % IV SOLN
3.0000 g | Freq: Two times a day (BID) | INTRAVENOUS | Status: AC
  Administered 2023-08-22 – 2023-08-24 (×4): 3 g via INTRAVENOUS
  Filled 2023-08-22 (×4): qty 8

## 2023-08-22 NOTE — Progress Notes (Signed)
NAME:  Jill Barnes, MRN:  [id number], DOB:  [birth year], LOS: 3 ADMISSION DATE:  07/26/2023, CONSULTATION DATE:  07/25/2023 REFERRING MD:  Dr. Derry Lory - stroke, CHIEF COMPLAINT:  Vent management    History of Present Illness:   Jill Barnes is a 62 year old female with a past medical history significant for COPD, diabetes, smoker, remote history of DVT, and prior right MCA stroke with residual left-sided weakness who presented to the ED with left hemiplegia with neglect of left side and right gaze deviation.  Last known normal 2300 on 9/26, code stroke initiated.  CT head on admission concerning for right MCA territory infarct, CTA head and neck positive for large vessel occlusion resulting in emergent consult interventional IR for thrombectomy.  Patient decompensation post CT with hypoxia resulting in emergent intubation.  Post intubation carotid arteriogram was preformed for occluded right middle cerebral artery with complete revascularization but observed contrast extravasation on follow-up angio.  Concern for aspiration event prior to intubation given high vent requirements therefore postprocedure patient was left intubated and PCCM was consulted for further assistance in management.  Pertinent  Medical History  COPD, diabetes, smoker, remote history of DVT, and prior right MCA stroke with residual left-sided weakness   Significant Hospital Events: Including procedures, antibiotic start and stop dates in addition to other pertinent events   9/27 presented with left-sided hemiplegia and right gaze deviation in the setting of acute ischemic stroke.  Thrombectomy per IR, left intubated postprocedure 9/28 intermittent episodes of hypotension correlated to increased sedation requirement night.  Interim History / Subjective:   Continues on PSV trials.  Mental status remains poor.  Objective   Blood pressure (!) 133/59, pulse 88, temperature 98.6 F (37 C), resp. rate (!) 25, height 5'  5.98" (1.676 m), weight 118.5 kg, SpO2 95%.    Vent Mode: PSV;CPAP FiO2 (%):  [40 %] 40 % Set Rate:  [24 bmp] 24 bmp Vt Set:  [460 mL] 460 mL PEEP:  [5 cmH20] 5 cmH20 Pressure Support:  [10 cmH20] 10 cmH20 Plateau Pressure:  [20 cmH20-30 cmH20] 22 cmH20   Intake/Output Summary (Last 24 hours) at 08/22/2023 1419 Last data filed at 08/22/2023 1332 Gross per 24 hour  Intake 4453.06 ml  Output 115 ml  Net 4338.06 ml   Filed Weights   07/31/2023 0300 08/22/23 0427  Weight: 113.8 kg 118.5 kg   Examination: Gen:      No acute distress HEENT:  EOMI, sclera anicteric Neck:     No masses; no thyromegaly, ETT Lungs:    Clear to auscultation bilaterally; normal respiratory effort CV:         Regular rate and rhythm; no murmurs Abd:      + bowel sounds; soft, non-tender; no palpable masses, no distension Ext:    No edema; adequate peripheral perfusion Skin:      Warm and dry; no rash Neuro: Sedated  Lab/imaging reviewed Significant for sodium 134, BN/creatinine 50/4.16 WBC 9.8, hemoglobin 9.7, platelets 158  Resolved Hospital Problem list     Assessment & Plan:  Acute right MCA stroke with right MCA M1 occlusion -S/p thrombectomy with post intervention contrast extravasation seen History of prior right MCA stroke with residual left-sided weakness P: Management per neurology Neuroprotective measures Nutrition and bowel regiment Secondary stroke prevention Aspiration precautions PT/OT/SLP as able  Acute Hypoxic Respiratory Failure  Concern for aspiration pneumonia History of COPD  Daily tobacco use  -Patient emergently intubated per EDP after decompensation post that head CT.  Per chart oxygen saturations dropped as low as 70 with dusky appearance. P: Continue ventilator support with lung protective strategies  Wean PEEP and FiO2 for sats greater than 90%. SBT's as tolerated. Copious secretions.  Follow sputum culture. Hypertonic nebs. Chest PT.  Desats quickly with  arousal VAP bundle in place  PAD protocol - propofol and fentanyl Continue empiric Unasyn Tobacco cessation education when appropriate  HFpEF -Echo 9/27 EF 60 to 65% with no WMA and grade 2 diastolic dysfunction. P: Continuous telemetry  Statin Strict intake and output  Daily weight to assess volume status Optimize electrolytes  History of diabetes -Home medications include NPH P: Continue sliding scale insulin, to be coverage, and long-acting insulin CBG checks every 4 hours CBG goal 140-180  Anemia: continues to drift downward. Hemoglobin 13 on 9/27 and now 9.5. - Send hemoccult - Repeat coags - Transfuse per usual ICU guidelines  CKD stage 3a -Review of renal function reveals GFR ranges between 58-52 since January 2023 P: Follow renal function - labs look a little worse today Monitor urine output- Has dropped off some Trend Bmet May need to diuresis.    Best Practice (right click and "Reselect all SmartList Selections" daily)   Diet/type: NPO DVT prophylaxis: SCD GI prophylaxis: PPI Lines: N/A Foley:  Yes, and it is still needed Code Status:  full code Last date of multidisciplinary goals of care discussion: Pending   Critical care time: 39 mins   The patient is critically ill with multiple organ system failure and requires high complexity decision making for assessment and support, frequent evaluation and titration of therapies, advanced monitoring, review of radiographic studies and interpretation of complex data.   Critical Care Time devoted to patient care services, exclusive of separately billable procedures, described in this note is 35 minutes.   Chilton Greathouse MD [facility] See Amion for pager  If no response to pager , please call [phone] until 7pm After 7:00 pm call Elink  [phone] 08/22/2023, 5:23 PM

## 2023-08-22 NOTE — Progress Notes (Signed)
PT Cancellation Note  Patient Details Name: Jill Barnes MRN: [id number] DOB: 1961/05/06   Cancelled Treatment:    Reason Eval/Treat Not Completed: Patient not medically ready (ventilated, sedated), no plans to extubate at this time.   Lyanne Co, PT  Acute Rehab Services Secure chat preferred Office [phone]    Elyse Hsu 08/22/2023, 8:26 AM

## 2023-08-22 NOTE — Progress Notes (Addendum)
STROKE TEAM PROGRESS NOTE   BRIEF HPI [name] is a 62 y.o. adult with history of smoking, COPD, DVT, diabetes, kidney stones and right MCA stroke presenting with sudden onset left hemiplegia right gaze deviation and left-sided neglect.  She was found to have a right MCA M1 occlusion.  She presented outside the TNK window, but was taken to IR for mechanical thrombectomy.  Revascularization was achieved with 1 pass.  She remains intubated postprocedure and has been acidotic and hypercarbic.  She likely aspirated at some point.  MRI 9/30 demonstrates right MCA stroke with some hemorrhagic transformation. Patient with likely AKI. Nephroogy consulted 9/30.   SIGNIFICANT HOSPITAL EVENTS 9/27: Patient admitted with right MCA stroke, mechanical thrombectomy performed  INTERIM HISTORY/SUBJECTIVE Patient is seen in her room with 1 family member at the bedside.  She remains intubated on sedation with propofol and fentanyl.  Secretions have increased overnight, and hypertonic saline nebs were started by CCM.  She continues on Unasyn for aspiration pneumonia.  Will check follow-up head CT in the morning.   OBJECTIVE  CBC    Component Value Date/Time   WBC 9.8 08/22/2023 0542   RBC 2.96 (L) 08/22/2023 0542   HGB 9.7 (L) 08/22/2023 0542   HCT 31.7 (L) 08/22/2023 0542   PLT 158 08/22/2023 0542   MCV 107.1 (H) 08/22/2023 0542   MCH 32.8 08/22/2023 0542   MCHC 30.6 08/22/2023 0542   RDW 15.7 (H) 08/22/2023 0542   LYMPHSABS 1.6 08/22/2023 0542   MONOABS 0.8 08/22/2023 0542   EOSABS 0.1 08/22/2023 0542   BASOSABS 0.1 08/22/2023 0542    BMET    Component Value Date/Time   NA 134 (L) 08/22/2023 0542   K 4.9 08/22/2023 0542   CL 102 08/22/2023 0542   CO2 21 (L) 08/22/2023 0542   GLUCOSE 252 (H) 08/22/2023 0542   BUN 50 (H) 08/22/2023 0542   CREATININE 4.16 (H) 08/22/2023 0542   CALCIUM 7.8 (L) 08/22/2023 0542   GFRNONAA 12 (L) 08/22/2023 0542    IMAGING past 24 hours CT HEAD WO  CONTRAST ( )  Result Date: 08/21/2023 CLINICAL DATA:  Provided history: Stroke, follow-up. EXAM: CT HEAD WITHOUT CONTRAST TECHNIQUE: Contiguous axial images were obtained from the base of the skull through the vertex without intravenous contrast. RADIATION DOSE REDUCTION: This exam was performed according to the departmental dose-optimization program which includes automated exposure control, adjustment of the mA and/or kV according to patient size and/or use of iterative reconstruction technique. COMPARISON:  Prior head CT examinations 08/20/2023 and earlier. Brain MRI 08/10/2023. FINDINGS: Brain: Acute hemorrhage within and along the right cerebral hemisphere has not significantly changed in extent as compared to yesterday's exam (with measurements provided on yesterday's study). The intra-axial hemorrhage is centered at the right operculum and insula. Parenchymal edema surrounding the parenchymal hemorrhage has not appreciably changed. Similar subarachnoid hemorrhage along the right cerebral hemisphere, greatest along the right frontal and temporal lobes, and within the right sylvian fissure. Unchanged regional mass effect related to the acute right MCA territory infarct and associated hemorrhage without midline shift. Unchanged small-volume intraventricular hemorrhage within the occipital horns of both lateral ventricles, and within the left temporal horn. Small-volume subarachnoid hemorrhage within the fourth ventricle and elsewhere within the posterior fossa has undergone some interval redistribution, but is similar in amount. Ex vacuo dilatation of the atrium and occipital horn of the right lateral ventricle due to a chronic posterior right MCA territory infarct. No evidence of hydrocephalus. Trace acute subarachnoid  hemorrhage is again noted along the posterior left cerebral hemisphere. Redemonstrated chronic cortical/subcortical infarct within the left occipital lobe. Redemonstrated small chronic  infarcts within the left periatrial white matter and bilateral cerebellar hemispheres. Background patchy and ill-defined hypoattenuation within the cerebral white matter, nonspecific but compatible with age advanced chronic small vessel ischemic disease. Vascular: Atherosclerotic calcifications. Skull: No calvarial fracture or aggressive osseous lesion. Sinuses/Orbits: No orbital mass or acute orbital finding. Small mucous retention cysts and/or polyps within the left maxillary sinus at the imaged levels. Small fluid levels within the bilateral sphenoid sinuses. Mucosal thickening and small-volume secretions within a posterior right ethmoid air cell. Other: Small-volume fluid within the bilateral mastoid air cells. IMPRESSION: 1. Small-volume acute subarachnoid hemorrhage within the fourth ventricle and elsewhere within the posterior fossa has undergone some interval redistribution. Otherwise, no significant change as compared to yesterday's exam. 2. Acute parenchymal and subarachnoid hemorrhage centered within and about a known acute right MCA territory infarct (which is itself centered at the right operculum and insula). No midline shift. 3. Small-volume intraventricular hemorrhage without evidence of hydrocephalus. 4. Trace acute subarachnoid hemorrhage along the posterior left cerebral hemisphere. 5. Background age advanced chronic small vessel ischemic disease with multiple chronic infarcts, as detailed. Electronically Signed   By: Jackey Loge D.O.   On: 08/21/2023 19:15   VAS [place] LOWER EXTREMITY VENOUS (DVT)  Result Date: 08/21/2023  Lower Venous DVT Study Patient Name:  Jill Barnes  Date of Exam:   08/21/2023 Medical Rec #: [id number]         Accession #:    [id number] Date of Birth: 04-30-1961         Patient Gender: F Patient Age:   17 years Exam Location:  [facility] Procedure:      VAS [place] LOWER EXTREMITY VENOUS (DVT) Referring Phys: Scheryl Marten Fedora Knisely  --------------------------------------------------------------------------------  Indications: Stroke.  Limitations: Body habitus and ventilation. Comparison Study: Prior negative bilateral LEV done 06/28/20 Performing Technologist: Sherren Kerns RVS  Examination Guidelines: A complete evaluation includes B-mode imaging, spectral Doppler, color Doppler, and power Doppler as needed of all accessible portions of each vessel. Bilateral testing is considered an integral part of a complete examination. Limited examinations for reoccurring indications may be performed as noted. The reflux portion of the exam is performed with the patient in reverse Trendelenburg.  +--------+---------------+---------+-----------+----------------+-------------+ RIGHT   CompressibilityPhasicitySpontaneityProperties      Thrombus                                                                 Aging         +--------+---------------+---------+-----------+----------------+-------------+ CFV     Full                               pulsatile                                                                waveforms                     +--------+---------------+---------+-----------+----------------+-------------+  SFJ     Full                                                             +--------+---------------+---------+-----------+----------------+-------------+ FV Prox Full                                                             +--------+---------------+---------+-----------+----------------+-------------+ FV Mid  Full                                                             +--------+---------------+---------+-----------+----------------+-------------+ FV      Full                                                             Distal                                                                   +--------+---------------+---------+-----------+----------------+-------------+  PFV     Full                                                             +--------+---------------+---------+-----------+----------------+-------------+ POP     Full                               pulsatile                                                                waveforms                     +--------+---------------+---------+-----------+----------------+-------------+ PTV     Full                                                             +--------+---------------+---------+-----------+----------------+-------------+ PERO    Full                                                             +--------+---------------+---------+-----------+----------------+-------------+   +---------+---------------+---------+-----------+---------------+--------------+  LEFT     CompressibilityPhasicitySpontaneityProperties     Thrombus Aging +---------+---------------+---------+-----------+---------------+--------------+ CFV      Full                               pulsatile                                                                 waveforms                     +---------+---------------+---------+-----------+---------------+--------------+ SFJ      Full                                                             +---------+---------------+---------+-----------+---------------+--------------+ FV Prox  Full                                                             +---------+---------------+---------+-----------+---------------+--------------+ FV Mid   Full                                                             +---------+---------------+---------+-----------+---------------+--------------+ FV DistalFull                                                             +---------+---------------+---------+-----------+---------------+--------------+ PFV      Full                                                              +---------+---------------+---------+-----------+---------------+--------------+ POP                                                        patent by                                                                 color and  Doppler        +---------+---------------+---------+-----------+---------------+--------------+ PTV      Full                                                             +---------+---------------+---------+-----------+---------------+--------------+ PERO     Full                                                             +---------+---------------+---------+-----------+---------------+--------------+    Summary: BILATERAL: - No evidence of deep vein thrombosis seen in the lower extremities, bilaterally. -No evidence of popliteal cyst, bilaterally.   *See table(s) above for measurements and observations.    Preliminary     Vitals:   08/22/23 0600 08/22/23 0700 08/22/23 0800 08/22/23 0900  BP: (!) 113/55 (!) 117/56 (!) 131/59 (!) 120/57  Pulse: 89 87 94 87  Resp: (!) 24 (!) 22 (!) 40 (!) 24  Temp: 98.6 F (37 C) 98.6 F (37 C) 98.6 F (37 C) 98.6 F (37 C)  TempSrc:      SpO2: 94% 97% 90% 95%  Weight:      Height:         PHYSICAL EXAM  Temp:  [98.4 F (36.9 C)-99.3 F (37.4 C)] 98.6 F (37 C) (09/30 0900) Pulse Rate:  [82-94] 87 (09/30 0900) Resp:  [19-40] 24 (09/30 0900) BP: (105-131)/(50-64) 120/57 (09/30 0900) SpO2:  [89 %-97 %] 95 % (09/30 0900) FiO2 (%):  [40 %] 40 % (09/30 0750) Weight:  [118.5 kg] 118.5 kg (09/30 0427)  General - Well nourished, well developed, intubated on sedation.  Ophthalmologic - fundi not visualized due to noncooperation.  Cardiovascular - Regular rate and rhythm.  Neuro - intubated on sedation, eyes closed, eyes do not open to voice, touch or noxious stimuli.  She is unable to follow commands.  Pupils are pinpoint, no doll's eyes  reflex, positive cough and gag, will withdraw right upper and right lower extremities to noxious stimuli but does not move left upper and lower extremities   ASSESSMENT/PLAN  Stroke: right MCA large and left MCA/ACA punctate infarcts with right M1 occlusion s/p IR with TICI3, complicated with large hemorrhagic conversion, etiology: Large vessel occlusion vs. Cardioembolic source Code Stroke CT head subtly decreased attenuation at level of right frontal operculum, suspicious for right MCA territory infarct ASPECTS 9 CTA head & neck occlusion of distal right M1 segment, carotid stenosis of 65 to 70% on the left and 30 to 40% on the right  MRI right MCA territory infarct with some hemorrhagic transformation, additional punctate infarcts in left MCA/ACA territory CT repeat 9/28 Combined intra-axial hematoma, 29 mL (right insula and operculum) and small to moderate volume right hemisphere subarachnoid hemorrhage, trace intraventricular blood. CT repeat 9/29 blood redistribution but otherwise, no significant change  2D Echo EF 60-65% LE venous doppler no DVT May consider loop recorder if significant neuro improvement Direct LDL 59, TG 416 HgbA1c 7.5 UDS neg VTE prophylaxis - heparin subq aspirin 81 mg daily prior to admission, now on No antithrombotic now given significant hemorrhagic transformation. Therapy recommendations: Pending Disposition: Pending  Respiratory  failure and aspiration pneumonia Patient left intubated postprocedure Concern for aspiration event prior to intubation High vent settings preclude possibility of extubation or positive sedation for exam Leukocytosis WBC 13.4->10.7->9.8 Unasyn for possible aspiration pneumonia Ventilator management per CCM  Renal failure Cre 1.17->1.19-> 2.05 > 4.16  Oligouria  S/p lasix yesterday and today Consulted Nephorology 9/30  SVT Intermittent tachycardia overnight EKG showed sinus tachycardia  Currently NSR Close monitoring  Hx  of Stroke/TIA 09/2020 admitted for right M2 occlusion, CT and MRI showed moderate to large right MCA infarct, as well as left parietal, right cerebellum, right splenium small infarcts. CTA head and neck showed right M2 occlusion, b/l ICA 60% stenosis L>R. EF 60-65%, LDL 125 and A1C 9.1. discharged on DAPT and lipitor 20.  Hx of PE and DVT  Occurred in 06/2020 due to COVID pneumonia  This admission CTA no PE LE venous doppler negative this admission  History of hypertension now hypotensive Home meds: Lisinopril 5 mg daily Unstable, requiring levophed Now BP goal 120-160 Long term BP goal normotensive  Hyperlipidemia Home meds: Atorvastatin 20 mg daily  LDL 59, goal < 70 On lipitor 20 No high intensity statin due to LDL at goal Continue statin at discharge  Diabetes type II Uncontrolled Home meds: Insulin NPH 130 units in the morning and 35 units in the evening HgbA1c 7.5, goal < 7.0 CBGs SSI Now on insulin Recommend close follow-up with PCP for better DM control  Tobacco Abuse Patient smokes 1 packs per day Will discuss nicotine replacement therapy when patient is extubated  Dysphagia Remains intubated NPO OG in place with TF  Other Stroke Risk Factors Obesity, Body mass index is 42.19 kg/m., BMI >/= 30 associated with increased stroke risk, recommend weight loss, diet and exercise as appropriate  Migraines  Other Active Problems 1.1 cm left parotid lesion-recommend outpatient ENT follow-up  Hospital day # 3  Patient seen by NP with MD, MD to edit note as needed. Arline Asp , MSN, FNP-C [facility] See Amion for schedule and pager information 08/22/2023 11:07 AM  ATTENDING NOTE: I reviewed above note and agree with the assessment and plan. Pt was seen and examined.   Daughter at the bedside. Pt still intubated on propofol and fentanyl. On exam, she is sedated, not open eyes on voice or tactile stimulation, with eye opening, b/l pupil 2mm,  reactive to light, eye midline, not blinking to visual threat, positive cough. Left hemiplegia.   Per RN, pt intermittent tachycardia, EKG showed sinus tachy. Her Cre had significant jump with oligouria, concerning for acute renal failure, nephrology consulted. Still on TF. Met family yesterday and they would like continued care for now but they did say that pt would not want to live in a state of no quality and fully dependent.   For detailed assessment and plan, please refer to above/below as I have made changes wherever appropriate.   Marvel Plan, MD PhD Stroke Neurology 08/22/2023 3:08 PM  This patient is critically ill due to right MCA infarct with hemorrhagic conversion, respiratory failure, uncontrolled DM, dysphagia and at significant risk of neurological worsening, death form recurrent stroke, hematoma expansion, DKA, aspiration, sepsis. This patient's care requires constant monitoring of vital signs, hemodynamics, respiratory and cardiac monitoring, review of multiple databases, neurological assessment, discussion with family, other specialists and medical decision making of high complexity. I spent 40 minutes of neurocritical care time in the care of this patient. I had long discussion with daughter at bedside, updated pt  current condition, treatment plan and potential prognosis, and answered all the questions. She expressed understanding and appreciation.     To contact Stroke Continuity provider, please refer to [email or web address]. After hours, contact General Neurology

## 2023-08-22 NOTE — Progress Notes (Signed)
SLP Cancellation Note  Patient Details Name: Jill Barnes MRN: [id number] DOB: February 16, 1961   Cancelled treatment:       Reason Eval/Treat Not Completed: Patient not medically ready. Pt remains on vent, not likely to extubate today per RN. SLP to sign off at this time - please reorder when ready.     Mahala Menghini., M.A. CCC-SLP Acute Rehabilitation Services Office [phone]  Secure chat preferred  08/22/2023, 8:13 AM

## 2023-08-22 NOTE — Progress Notes (Signed)
OT Cancellation Note  Patient Details Name: Jill Barnes MRN: [id number] DOB: Jun 17, 1961   Cancelled Treatment:    Reason Eval/Treat Not Completed: Patient not medically ready Patient not medically ready (ventilated, sedated started hypertonic saline), no plans to extubate at this time.    Timmothy Euler, OTR/L  Acute Rehabilitation Services Office: [phone] .   Mateo Flow 08/22/2023, 8:46 AM

## 2023-08-22 NOTE — Progress Notes (Signed)
PT experiencing occasional episodes of Paroxysmal A. Fib from a NSR baseline HR in 80s. With A. Fib the HR will increase to 160s lasting ~ 5 seconds and return, without intervention, back to Normal Sinus. PT at rest with aid of sedation during these times. Strip was saved Chart Review>CV Strips.

## 2023-08-22 NOTE — Consult Note (Signed)
[facility] Renal Consultation Note  Requesting MD: Marvel Plan, MD Indication for Consultation:  AKI   Chief complaint: had a stroke  HPI:  Jill Barnes is a 62 y.o. female with a history of tobacco abuse, COPD, DM, nephrolithiasis, and prior right MCA stroke with residual mild left-sided weakness who presented with sudden-onset left hemiplegia.  Presented as a code stroke.  She was noted to be outside of the TNK window but was taken to IR for mechanical thrombectomy.  She has been intubated and per charting there is concern for aspiration.  Nephrology is called for AKI.  Cr 2.05 on 9/29 and now 4.16 on 9/30.  She got lasix 40 mg IV this AM and has gotten a couple of intermittent doses recently.  Got LR on 2023/09/14.  She was on levophed on September 14, 2023 and 9/28 per MAR history.  She had 165 mL uop over 9/29 charted.  She did get IV contrast on September 14, 2023.    Creatinine, Ser  Date/Time Value Ref Range Status  08/22/2023 05:42 AM 4.16 (H) 0.44 - 1.00 mg/dL Final    Comment:    DELTA CHECK NOTED  08/21/2023 07:04 AM 2.05 (H) 0.44 - 1.00 mg/dL Final  78/29/5621 30:86 AM 1.19 (H) 0.44 - 1.00 mg/dL Final  57/84/6962 95:28 AM 1.10 (H) 0.44 - 1.00 mg/dL Final  41/32/4401 02:72 AM 1.17 (H) 0.44 - 1.00 mg/dL Final  53/66/4403 47:42 AM 1.07 (H) 0.44 - 1.00 mg/dL Final  59/56/3875 64:33 PM 1.09 (H) 0.44 - 1.00 mg/dL Final  29/51/8841 66:06 PM 0.80 0.44 - 1.00 mg/dL Final  30/16/0109 32:35 PM 0.87 0.44 - 1.00 mg/dL Final  57/32/2025 42:70 AM 1.05 (H) 0.44 - 1.00 mg/dL Final  62/37/6283 15:17 AM 0.98 0.44 - 1.00 mg/dL Final  61/60/7371 06:26 AM 0.81 0.44 - 1.00 mg/dL Final  94/85/4627 03:50 AM 0.68 0.44 - 1.00 mg/dL Final  09/38/1829 93:71 AM 0.86 0.44 - 1.00 mg/dL Final  69/67/8938 10:17 PM 0.86 0.44 - 1.00 mg/dL Final  51/12/5850 77:82 AM 0.73 0.44 - 1.00 mg/dL Final  42/35/3614 43:15 PM 1.08 (H) 0.44 - 1.00 mg/dL Final  40/06/6760 95:09 AM 0.81 0.44 - 1.00 mg/dL Final  32/67/1245 80:99 AM 0.90  0.44 - 1.00 mg/dL Final  83/38/2505 39:76 AM 0.78 0.44 - 1.00 mg/dL Final  73/41/9379 02:40 PM 0.90 0.44 - 1.00 mg/dL Final  97/35/3299 24:26 PM 0.76 0.44 - 1.00 mg/dL Final  83/41/9622 29:79 PM 0.88 0.44 - 1.00 mg/dL Final  89/21/1941 74:08 PM 0.84 0.44 - 1.00 mg/dL Final  14/48/1856 31:49 AM 0.73 0.44 - 1.00 mg/dL Final  70/26/3785 88:50 AM 0.76 0.44 - 1.00 mg/dL Final  27/74/1287 86:76 AM 0.71 0.44 - 1.00 mg/dL Final  72/07/4708 62:83 PM 0.75 0.44 - 1.00 mg/dL Final  66/29/4765 46:50 PM 0.87 0.44 - 1.00 mg/dL Final  35/46/5681 27:51 PM 0.70 0.50 - 1.10 mg/dL Final     PMHx:   Past Medical History:  Diagnosis Date   COPD (chronic obstructive pulmonary disease) (HCC)    Diabetes mellitus without complication (HCC)    Kidney stone     Past Surgical History:  Procedure Laterality Date   ABDOMINAL HYSTERECTOMY     BLADDER SURGERY     IR CT HEAD LTD  09/14/2023   IR PERCUTANEOUS ART THROMBECTOMY/INFUSION INTRACRANIAL INC DIAG ANGIO  2023/09/14   RADIOLOGY WITH ANESTHESIA N/A 09-14-23   Procedure: IR WITH ANESTHESIA;  Surgeon: Julieanne Cotton, MD;  Location: [facility] OR;  Service: Radiology;  Laterality: N/A;    Family Hx:  Family History  Problem Relation Age of Onset   Diabetes Mother    Heart failure Father    Diabetes Father   Unable to obtain 2/2 mechanical ventilation   Social History:  reports that she has been smoking cigarettes. She has never used smokeless tobacco. She reports that she does not drink alcohol and does not use drugs.  Allergies:  Allergies  Allergen Reactions   Sulfa Antibiotics Shortness Of Breath and Swelling    All-over swelling   Penicillins Rash    Medications: Prior to Admission medications   Medication Sig Start Date End Date Taking? Authorizing Provider  albuterol (VENTOLIN HFA) 108 (90 Base) MCG/ACT inhaler Inhale 1-2 puffs into the lungs every 6 (six) hours as needed for wheezing or shortness of breath. 07/05/23  Yes Prosperi, Christian  H, PA-C  aspirin EC 81 MG tablet Take 81 mg by mouth daily. Swallow whole.   Yes [provider]  atorvastatin (LIPITOR) 20 MG tablet Take 1 tablet (20 mg total) by mouth daily. 07/02/20  Yes Elgergawy, Leana Roe, MD  fluticasone-salmeterol (ADVAIR) 250-50 MCG/ACT AEPB Inhale 1 puff into the lungs in the morning and at bedtime. 08/17/23  Yes [provider]  lisinopril (ZESTRIL) 5 MG tablet Take 2.5 mg by mouth daily. Patient takes one-half tablet daily.   Yes [provider]  pantoprazole (PROTONIX) 40 MG tablet Take 1 tablet (40 mg total) by mouth daily. 07/03/20  Yes Elgergawy, Leana Roe, MD  Continuous Blood Gluc Sensor (FREESTYLE LIBRE 14 DAY SENSOR) MISC Change every 14 days 03/29/22   Romero Belling, MD  insulin NPH Human (NOVOLIN N) 100 UNIT/ML injection Inject 1.8 mLs (180 Units total) into the skin every morning. 130 units each morning, and 35 units in the evening. Patient taking differently: Inject 90 Units into the skin every morning. 90 units each morning, and 90 units in the evening. 03/29/22   Romero Belling, MD   I have reviewed the patient's current and reported prior to admission medications.  Labs:     Latest Ref Rng & Units 08/22/2023    5:42 AM 08/21/2023    7:04 AM 08/20/2023    8:09 AM  BMP  Glucose 70 - 99 mg/dL 295  621    BUN 8 - 23 mg/dL 50  30    Creatinine 3.08 - 1.00 mg/dL 6.57  8.46    Sodium 962 - 145 mmol/L 134  137  142   Potassium 3.5 - 5.1 mmol/L 4.9  4.4  4.0   Chloride 98 - 111 mmol/L 102  105    CO2 22 - 32 mmol/L 21  22    Calcium 8.9 - 10.3 mg/dL 7.8  7.8      Urinalysis    Component Value Date/Time   COLORURINE STRAW (A) 07/31/2023 0810   APPEARANCEUR CLEAR 08/17/2023 0810   LABSPEC 1.034 (H) 08/03/2023 0810   PHURINE 5.0 07/30/2023 0810   GLUCOSEU NEGATIVE 08/09/2023 0810   HGBUR SMALL (A) 08/06/2023 0810   BILIRUBINUR NEGATIVE 08/05/2023 0810   KETONESUR NEGATIVE 07/24/2023 0810   PROTEINUR 30 (A) 08/03/2023 0810    UROBILINOGEN 1.0 05/11/2014 1323   NITRITE NEGATIVE 08/21/2023 0810   LEUKOCYTESUR NEGATIVE 08/05/2023 0810     ROS:  Unable to obtain secondary to mechanical ventilation   Physical Exam: Vitals:   08/22/23 1400 08/22/23 1500  BP: 96/64 (!) 125/59  Pulse: 100 90  Resp: (!) 35 17  Temp:  98.2 F (36.8 C)   SpO2: 91% 95%     General:  adult female in bed critically ill   HEENT: NCAT Eyes: eyes closed  Neck: supple trachea midline Heart: S1S2 no rub Lungs: coarse mechanical breath sounds; FIO2 40 and PEEP 5 Abdomen: soft/obese habitus/distended; nontender with limits of exam on sedation Extremities: no pitting edema; no clubbing or cyanosis appreciated Skin: no rash on extremities exposed  Neuro: sedation is currently running; she does not follow commands GU foley in place   Assessment/Plan:   # AKI  - secondary to ischemic ATN in the setting of hypotension and pressor requirement - continue supportive care  - Increase NS to 75 ml/hr    # Acute right MCA stroke  - s/p thrombectomy with IR  - Per Neurology  - Blood pressure management per neurology and primary team in the setting of acute stroke   # HTN  - Blood pressure management per neurology and primary team in the setting of acute stroke   # Aspiration PNA - abx per primary team   # Acute hypoxic respiratory failure  - Vent per critical care  # Macrocytic anemia  - no ESA in setting of acute stroke  - mild   Disposition - in ICU   Estanislado Emms 08/22/2023, 3:46 PM

## 2023-08-22 NOTE — TOC CM/SW Note (Signed)
Transition of Care [facility]) - Inpatient Brief Assessment   Patient Details  Name: Jill Barnes MRN: [id number] Date of Birth: May 08, 1961  [facility]) CM/SW Contact:    Mearl Latin, LCSW Phone Number: 08/22/2023, 4:54 PM   Clinical Narrative: Patient admitted from home and is currently intuabted. TOC following for potential needs as patient is uninsured.    Transition of Care Asessment: Insurance and Status: Selfpay Patient has primary care physician: Yes Home environment has been reviewed: From home Prior level of function:: Independent Prior/Current Home Services: No current home services Social Determinants of Health Reivew: SDOH reviewed no interventions necessary Readmission risk has been reviewed: Yes Transition of care needs: transition of care needs identified, TOC will continue to follow

## 2023-08-22 NOTE — Progress Notes (Signed)
eLink Physician-Brief Progress Note Patient Name: Jill Barnes DOB: 1961/10/24 MRN: [id number]   Date of Service  08/22/2023  HPI/Events of Note  Pt with UO for the shift so far.  SBP 100s-110s I/O indicate pt positive >6 liters since admission.   eICU Interventions  Placed order for lasix 40mg  IV.  Will follow response.         Dahlton Hinde M DELA CRUZ 08/22/2023, 4:10 AM

## 2023-08-22 NOTE — Inpatient Diabetes Management (Signed)
Inpatient Diabetes Program Recommendations  AACE/ADA: New Consensus Statement on Inpatient Glycemic Control (2015)  Target Ranges:  Prepandial:   less than 140 mg/dL      Peak postprandial:   less than 180 mg/dL (1-2 hours)      Critically ill patients:  140 - 180 mg/dL   Lab Results  Component Value Date   GLUCAP 250 (H) 08/22/2023   HGBA1C 7.5 (H) August 31, 2023    Review of Glycemic Control  Diabetes history: DM2 Outpatient Diabetes medications: NPH 130 in am and 35 QHS Current orders for Inpatient glycemic control: Semglee 10 daily, Novolog 5 Q4H + 0-20 Q4H  Has FS LIbre CBGs above goal.  Inpatient Diabetes Program Recommendations:    Consider increasing Semglee to 12 units every day  Consider increasing Novolog to 6 units Q4H  Follow.  Thank you. Ailene Ards, RD, LDN, CDCES Inpatient Diabetes Coordinator [phone]

## 2023-08-23 ENCOUNTER — Inpatient Hospital Stay (HOSPITAL_COMMUNITY): Payer: Medicare Other

## 2023-08-23 DIAGNOSIS — I6601 Occlusion and stenosis of right middle cerebral artery: Secondary | ICD-10-CM

## 2023-08-23 DIAGNOSIS — Z515 Encounter for palliative care: Secondary | ICD-10-CM

## 2023-08-23 DIAGNOSIS — J9621 Acute and chronic respiratory failure with hypoxia: Secondary | ICD-10-CM

## 2023-08-23 DIAGNOSIS — T17908A Unspecified foreign body in respiratory tract, part unspecified causing other injury, initial encounter: Secondary | ICD-10-CM

## 2023-08-23 LAB — CBC WITH DIFFERENTIAL/PLATELET
Abs Immature Granulocytes: 0.27 10*3/uL — ABNORMAL HIGH (ref 0.00–0.07)
Basophils Absolute: 0 10*3/uL (ref 0.0–0.1)
Basophils Relative: 0 %
Eosinophils Absolute: 0.1 10*3/uL (ref 0.0–0.5)
Eosinophils Relative: 1 %
HCT: 28.5 % — ABNORMAL LOW (ref 36.0–46.0)
Hemoglobin: 8.8 g/dL — ABNORMAL LOW (ref 12.0–15.0)
Immature Granulocytes: 3 %
Lymphocytes Relative: 10 %
Lymphs Abs: 1 10*3/uL (ref 0.7–4.0)
MCH: 32.4 pg (ref 26.0–34.0)
MCHC: 30.9 g/dL (ref 30.0–36.0)
MCV: 104.8 fL — ABNORMAL HIGH (ref 80.0–100.0)
Monocytes Absolute: 0.9 10*3/uL (ref 0.1–1.0)
Monocytes Relative: 9 %
Neutro Abs: 7 10*3/uL (ref 1.7–7.7)
Neutrophils Relative %: 77 %
Platelets: 159 10*3/uL (ref 150–400)
RBC: 2.72 MIL/uL — ABNORMAL LOW (ref 3.87–5.11)
RDW: 15.9 % — ABNORMAL HIGH (ref 11.5–15.5)
WBC: 9.2 10*3/uL (ref 4.0–10.5)
nRBC: 0.7 % — ABNORMAL HIGH (ref 0.0–0.2)

## 2023-08-23 LAB — BASIC METABOLIC PANEL
Anion gap: 14 (ref 5–15)
BUN: 69 mg/dL — ABNORMAL HIGH (ref 8–23)
CO2: 16 mmol/L — ABNORMAL LOW (ref 22–32)
Calcium: 7.5 mg/dL — ABNORMAL LOW (ref 8.9–10.3)
Chloride: 104 mmol/L (ref 98–111)
Creatinine, Ser: 6.42 mg/dL — ABNORMAL HIGH (ref 0.44–1.00)
GFR, Estimated: 7 mL/min — ABNORMAL LOW (ref >60)
Glucose, Bld: 239 mg/dL — ABNORMAL HIGH (ref 70–99)
Potassium: 6.1 mmol/L — ABNORMAL HIGH (ref 3.5–5.1)
Sodium: 134 mmol/L — ABNORMAL LOW (ref 135–145)

## 2023-08-23 LAB — POTASSIUM
Potassium: 6.2 mmol/L — ABNORMAL HIGH (ref 3.5–5.1)
Potassium: 5.9 mmol/L — ABNORMAL HIGH (ref 3.5–5.1)

## 2023-08-23 LAB — CULTURE, RESPIRATORY W GRAM STAIN

## 2023-08-23 LAB — GLUCOSE, CAPILLARY
Glucose-Capillary: 234 mg/dL — ABNORMAL HIGH (ref 70–99)
Glucose-Capillary: 236 mg/dL — ABNORMAL HIGH (ref 70–99)
Glucose-Capillary: 271 mg/dL — ABNORMAL HIGH (ref 70–99)
Glucose-Capillary: 280 mg/dL — ABNORMAL HIGH (ref 70–99)
Glucose-Capillary: 293 mg/dL — ABNORMAL HIGH (ref 70–99)
Glucose-Capillary: 297 mg/dL — ABNORMAL HIGH (ref 70–99)

## 2023-08-23 LAB — FOLATE: Folate: 5.5 ng/mL — ABNORMAL LOW (ref >5.9)

## 2023-08-23 LAB — IRON AND TIBC
Iron: 55 ug/dL (ref 28–170)
Saturation Ratios: 22 % (ref 10.4–31.8)
TIBC: 245 ug/dL — ABNORMAL LOW (ref 250–450)
UIBC: 190 ug/dL

## 2023-08-23 LAB — FERRITIN: Ferritin: 332 ng/mL — ABNORMAL HIGH (ref 11–307)

## 2023-08-23 LAB — VITAMIN B12: Vitamin B-12: 171 pg/mL — ABNORMAL LOW (ref 180–914)

## 2023-08-23 MED ORDER — INSULIN GLARGINE-YFGN 100 UNIT/ML ~~LOC~~ SOLN
20.0000 [IU] | Freq: Two times a day (BID) | SUBCUTANEOUS | Status: DC
  Administered 2023-08-23 (×2): 20 [IU] via SUBCUTANEOUS
  Filled 2023-08-23 (×4): qty 0.2

## 2023-08-23 MED ORDER — FUROSEMIDE 10 MG/ML IJ SOLN
80.0000 mg | Freq: Once | INTRAMUSCULAR | Status: AC
  Administered 2023-08-23: 80 mg via INTRAVENOUS
  Filled 2023-08-23: qty 8

## 2023-08-23 MED ORDER — SODIUM CHLORIDE 0.9 % IV SOLN: INTRAVENOUS | Status: DC | PRN

## 2023-08-23 MED ORDER — SENNOSIDES-DOCUSATE SODIUM 8.6-50 MG PO TABS: 1.0000 | ORAL_TABLET | Freq: Every evening | ORAL | Status: DC | PRN

## 2023-08-23 MED ORDER — SODIUM ZIRCONIUM CYCLOSILICATE 10 G PO PACK
10.0000 g | PACK | Freq: Once | ORAL | Status: AC
  Administered 2023-08-23: 10 g
  Filled 2023-08-23: qty 1

## 2023-08-23 MED ORDER — NEPRO/CARBSTEADY PO LIQD
1000.0000 mL | ORAL | Status: DC
  Administered 2023-08-23: 1000 mL
  Filled 2023-08-23 (×2): qty 1000

## 2023-08-23 MED ORDER — SODIUM BICARBONATE 8.4 % IV SOLN
50.0000 meq | Freq: Once | INTRAVENOUS | Status: AC
  Administered 2023-08-23: 50 meq via INTRAVENOUS
  Filled 2023-08-23: qty 50

## 2023-08-23 MED ORDER — NEPRO/CARBSTEADY PO LIQD: 1000.0000 mL | ORAL | Status: DC

## 2023-08-23 MED ORDER — LABETALOL HCL 5 MG/ML IV SOLN
10.0000 mg | INTRAVENOUS | Status: DC | PRN
  Administered 2023-08-23 – 2023-08-24 (×2): 10 mg via INTRAVENOUS
  Filled 2023-08-23 (×2): qty 4

## 2023-08-23 MED ORDER — CALCIUM GLUCONATE-NACL 2-0.675 GM/100ML-% IV SOLN
2.0000 g | Freq: Once | INTRAVENOUS | Status: AC
  Administered 2023-08-23: 2000 mg via INTRAVENOUS
  Filled 2023-08-23: qty 100

## 2023-08-23 MED ORDER — HYDRALAZINE HCL 20 MG/ML IJ SOLN
10.0000 mg | INTRAMUSCULAR | Status: DC | PRN
  Administered 2023-08-23 (×2): 10 mg via INTRAVENOUS
  Filled 2023-08-23 (×2): qty 1

## 2023-08-23 MED ORDER — CLEVIDIPINE BUTYRATE 0.5 MG/ML IV EMUL
0.0000 mg/h | INTRAVENOUS | Status: DC
  Administered 2023-08-23: 2 mg/h via INTRAVENOUS
  Filled 2023-08-23: qty 50

## 2023-08-23 MED ORDER — SODIUM BICARBONATE 8.4 % IV SOLN
INTRAVENOUS | Status: DC
  Filled 2023-08-23: qty 1000

## 2023-08-23 NOTE — Progress Notes (Signed)
[facility] Progress Note  Name: MERCEDEE CHESTER MRN: [id number] DOB: 25-Nov-1960  Chief Complaint:  Sudden-onset weakness  Subjective:  She had 100 mL uop over 9/30 charted.  She got a dose of lasix 80 mg IV this AM per team.  I spoke with her husband via phone.  He states that she would never want to be kept alive by machines and that she had discussed this with them.  He and his children have spoken about this and are in agreement.  She wouldn't have wanted dialysis.  Her nurse states that her daughter has personally expressed similar sentiments.   Review of systems:  Unable to obtain secondary to mechanical ventilation  ----------------- Background on consult:  AALEAH DINOLFO is a 62 y.o. female with a history of tobacco abuse, COPD, DM, nephrolithiasis, and prior right MCA stroke with residual mild left-sided weakness who presented with sudden-onset left hemiplegia.  Presented as a code stroke.  She was noted to be outside of the TNK window but was taken to IR for mechanical thrombectomy.  She has been intubated and per charting there is concern for aspiration.  Nephrology is called for AKI.  Cr 2.05 on 9/29 and now 4.16 on 9/30.  She got lasix 40 mg IV this AM and has gotten a couple of intermittent doses recently.  Got LR on Sep 13, 2023.  She was on levophed on Sep 13, 2023 and 9/28 per MAR history.  She had 165 mL uop over 9/29 charted.  She did get IV contrast on 09-13-2023.      Intake/Output Summary (Last 24 hours) at 08/23/2023 1249 Last data filed at 08/23/2023 1200 Gross per 24 hour  Intake 5148.7 ml  Output 120 ml  Net 5028.7 ml    Vitals:  Vitals:   08/23/23 0900 08/23/23 1000 08/23/23 1100 08/23/23 1200  BP: (!) 137/57 (!) 145/57 (!) 147/59 (!) 161/66  Pulse: 88 89 83 87  Resp: (!) 24 (!) 29 (!) 27 (!) 30  Temp: 98.4 F (36.9 C) 99 F (37.2 C) 98.8 F (37.1 C) (!) 95.5 F (35.3 C)  TempSrc:      SpO2: 96% 96% 98% 97%  Weight:      Height:         Physical Exam:   General:  adult female in bed critically ill   HEENT: NCAT Eyes: eyes closed  Neck: supple trachea midline Heart: S1S2 no rub Lungs: coarse mechanical breath sounds; FIO2 40 and PEEP 5 Abdomen: soft/obese habitus/distended; nontender with limits of exam on sedation Extremities: trace to 1+ edema Skin: no rash on extremities exposed  Neuro: no sedation is currently running; she does not follow commands  Medications reviewed   Labs:     Latest Ref Rng & Units 08/23/2023    7:06 AM 08/22/2023    5:42 AM 08/21/2023    7:04 AM  BMP  Glucose 70 - 99 mg/dL 829  562  130   BUN 8 - 23 mg/dL 69  50  30   Creatinine 0.44 - 1.00 mg/dL 8.65  7.84  6.96   Sodium 135 - 145 mmol/L 134  134  137   Potassium 3.5 - 5.1 mmol/L 6.1  4.9  4.4   Chloride 98 - 111 mmol/L 104  102  105   CO2 22 - 32 mmol/L 16  21  22    Calcium 8.9 - 10.3 mg/dL 7.5  7.8  7.8      Assessment/Plan:   # AKI  -  secondary to ischemic ATN in the setting of hypotension and pressor requirement - Worsening unfortunately and oligoanuric  - She is now DNR and palliative has been consulted.  Her husband shares that she wouldn't have wanted to "be kept alive by machines" and that the family is in agreement.  We will continue with conservative kidney management in keeping with the patient's wishes   # Hyperkalemia - s/p lokelma and lasix this AM; on insulin therapy - sodium bicarbonate 1 amp once now    - Will transition from osmolite to nepro for feeds   - repeat potassium     # Acute right MCA stroke  - s/p thrombectomy with IR  - Per Neurology  - Blood pressure management per neurology and primary team in the setting of acute stroke    # HTN  - Blood pressure management per neurology and primary team in the setting of acute stroke    # Aspiration PNA - abx per primary team    # Acute hypoxic respiratory failure  - Vent per critical care   # Macrocytic anemia  - no ESA in setting of acute stroke  - check iron  panel, B12, folic acid   Disposition - in ICU   Estanislado Emms, MD 08/23/2023 1:21 PM

## 2023-08-23 NOTE — Progress Notes (Signed)
Occupational Therapy Discharge (signoff) Patient Details Name: Jill Barnes MRN: [id number] DOB: 1961/01/02 Today's Date: 08/23/2023 Time:  -     Patient discharged from OT services secondary to medical decline - will need to re-order OT to resume therapy services. Pt now DNR and palliative following per RN.  Please see latest therapy progress note for current level of functioning and progress toward goals.    Progress and discharge plan discussed with patient and/or caregiver: Patient unable to participate in discharge planning and no caregivers available  GO     Mateo Flow 08/23/2023, 10:57 AM

## 2023-08-23 NOTE — Progress Notes (Signed)
Physical Therapy Discharge Patient Details Name: Jill Barnes MRN: [id number] DOB: Aug 28, 1961 Today's Date: 08/23/2023 Time:  -     Patient discharged from PT services secondary to medical decline - will need to re-order PT to resume therapy services.  Please see latest therapy progress note for current level of functioning and progress toward goals.    Progress and discharge plan discussed with patient and/or caregiver: Patient unable to participate in discharge planning and no caregivers available  GP     [name] 08/23/2023, 11:22 AM

## 2023-08-23 NOTE — Consult Note (Signed)
Consultation Note Date: 08/23/2023   Patient Name: Jill Barnes  DOB: [birth year]  MRN: [id number]  Age / Sex: 62 y.o., adult  PCP: Koren Shiver, DO Referring Physician: Stroke, Md, MD  Reason for Consultation: Establishing goals of care and Psychosocial/spiritual support  HPI/Patient Profile: 62 y.o. adult  admitted on 08/17/2023 with , diabetesa past medical history significant for COPD, smoker,Remote history of DVT, and prior right MCA stroke with residual left-sided weakness who presented to the ED with left hemiaplasia with neglect of the left side and right gaze deviation. CT head on admission concerning for right MCA territory infarct, CTA head and neck positive for large vessel occlusion resulting in emergent consult to interventional radiology for thrombectomy. Patient decompensated post CT with hypoxia resulting in emergent intubation.  Postintubation carotid arteriogram was performed for occluded right middle cerebral artery with complete revascularization but observed extravasation on follow-up angio.  Concern for aspiration event prior to intubation, high vent requirements.  Worsening renal function, nephrology consulted.  Creatinine today 6.42.   In conversation today with Dr. Maudry Diego family has expressed the patient would never desire initiation of dialysis and would not want to be kept alive by machines.  Family face treatment option decisions, advanced directive decisions and anticipatory care needs.     Clinical Assessment and Goals of Care:  This NP Lorinda Creed reviewed medical records, received report from team, assessed the patient and then spoke to patient's husband by phone to to discuss diagnosis, prognosis, GOC, EOL wishes disposition and options.   Concept of Palliative Care was introduced as specialized medical care for people and their families living with serious illness.   If focuses on providing relief from the symptoms and stress of a serious illness.  The goal is to improve quality of life for both the patient and the family.  Values and goals of care important to patient and family were attempted to be elicited.  Education offered on the seriousness of current medical situation and the likely associated long-term poor prognosis.    Patient's husband verbalizes clear understanding.  He has a clear sense of knowing that patient "would not want to be kept alive by machines".      Education offered on the difference between a aggressive medical intervention path  and a palliative comfort care path for this patient at this time was had.    Education  offered on the steps to a one-way extubation and shift to full comfort path allowing for natural death.  Education offered on the likely trajectory postextubation   At this time patient's husband will talk with his children tonight as they make clear decisions regarding next steps in transition of care.  Plan is for me to call him in the morning and hopefully set a time tomorrow to meet with family at bedside regarding one-way extubation     Questions and concerns addressed.  Patient  encouraged to call with questions or concerns.     PMT will continue to support holistically.  NEXT OF KIN    SUMMARY OF RECOMMENDATIONS    Code Status/Advance Care Planning: DNR    Palliative Prophylaxis:  Aspiration, Frequent Pain Assessment, and Oral Care  Additional Recommendations (Limitations, Scope, Preferences): No Hemodialysis  Psycho-social/Spiritual:  Desire for further Chaplaincy support:no Additional Recommendations: Education on Hospice  Prognosis:  Long-term poor prognosis  Discharge Planning: Anticipated Hospital Death      Primary Diagnoses: Present on Admission:  Acute ischemic right MCA stroke (HCC)  Middle cerebral artery embolism, right   I have reviewed the medical  record, interviewed the patient and family, and examined the patient. The following aspects are pertinent.  Past Medical History:  Diagnosis Date   COPD (chronic obstructive pulmonary disease) (HCC)    Diabetes mellitus without complication (HCC)    Kidney stone    Social History   Socioeconomic History   Marital status: Married    Spouse name: Not on file   Number of children: Not on file   Years of education: Not on file   Highest education level: Not on file  Occupational History   Occupation: unemployed  Tobacco Use   Smoking status: Every Day    Current packs/day: 1.00    Types: Cigarettes   Smokeless tobacco: Never  Vaping Use   Vaping status: Never Used  Substance and Sexual Activity   Alcohol use: No   Drug use: No   Sexual activity: Not on file  Other Topics Concern   Not on file  Social History Narrative   Lives with husband   Right handed   Drinks 3-5 cups of caffeine daily   Social Determinants of Health   Financial Resource Strain: Not on file  Food Insecurity: Not on file  Transportation Needs: Not on file  Physical Activity: Not on file  Stress: Not on file  Social Connections: Not on file   Family History  Problem Relation Age of Onset   Diabetes Mother    Heart failure Father    Diabetes Father    Scheduled Meds:  atorvastatin  20 mg Per Tube Daily   Chlorhexidine Gluconate Cloth  6 each Topical Daily   docusate  100 mg Per Tube BID   feeding supplement (PROSource TF20)  60 mL Per Tube Daily   heparin injection (subcutaneous)  5,000 Units Subcutaneous Q8H   insulin aspart  0-20 Units Subcutaneous Q4H   insulin aspart  7 Units Subcutaneous Q4H   insulin glargine-yfgn  20 Units Subcutaneous BID   mouth rinse  15 mL Mouth Rinse Q2H   pantoprazole  40 mg Oral Daily   Or   pantoprazole (PROTONIX) IV  40 mg Intravenous Daily   polyethylene glycol  17 g Per Tube Daily   sodium chloride HYPERTONIC  4 mL Nebulization BID   Continuous  Infusions:  ampicillin-sulbactam (UNASYN) IV Stopped (08/23/23 0828)   feeding supplement (NEPRO CARB STEADY)     fentaNYL infusion INTRAVENOUS 50 mcg/hr (08/23/23 1200)   propofol (DIPRIVAN) infusion Stopped (08/23/23 0807)   PRN Meds:.acetaminophen **OR** acetaminophen (TYLENOL) oral liquid 160 mg/5 mL **OR** acetaminophen, fentaNYL, ipratropium-albuterol, mouth rinse, senna-docusate Medications Prior to Admission:  Prior to Admission medications   Medication Sig Start Date End Date Taking? Authorizing Provider  albuterol (VENTOLIN HFA) 108 (90 Base) MCG/ACT inhaler Inhale 1-2 puffs into the lungs every 6 (six) hours as needed for wheezing or shortness of breath. 07/05/23  Yes Prosperi, Christian H, PA-C  aspirin EC 81 MG tablet Take 81 mg by mouth daily. Swallow  whole.   Yes [provider]  atorvastatin (LIPITOR) 20 MG tablet Take 1 tablet (20 mg total) by mouth daily. 07/02/20  Yes Elgergawy, Leana Roe, MD  fluticasone-salmeterol (ADVAIR) 250-50 MCG/ACT AEPB Inhale 1 puff into the lungs in the morning and at bedtime. 08/17/23  Yes [provider]  lisinopril (ZESTRIL) 5 MG tablet Take 2.5 mg by mouth daily. Patient takes one-half tablet daily.   Yes [provider]  pantoprazole (PROTONIX) 40 MG tablet Take 1 tablet (40 mg total) by mouth daily. 07/03/20  Yes Elgergawy, Leana Roe, MD  Continuous Blood Gluc Sensor (FREESTYLE LIBRE 14 DAY SENSOR) MISC Change every 14 days 03/29/22   Romero Belling, MD  insulin NPH Human (NOVOLIN N) 100 UNIT/ML injection Inject 1.8 mLs (180 Units total) into the skin every morning. 130 units each morning, and 35 units in the evening. Patient taking differently: Inject 90 Units into the skin every morning. 90 units each morning, and 90 units in the evening. 03/29/22   Romero Belling, MD   Allergies  Allergen Reactions   Sulfa Antibiotics Shortness Of Breath and Swelling    All-over swelling   Penicillins Rash   Review of Systems  Unable to  perform ROS: Acuity of condition    Physical Exam Constitutional:      Appearance: She is obese. She is ill-appearing.     Interventions: She is intubated.  Cardiovascular:     Rate and Rhythm: Normal rate.  Pulmonary:     Effort: Pulmonary effort is normal. She is intubated.  Skin:    General: Skin is warm and dry.  Neurological:     Mental Status: She is unresponsive.     Vital Signs: BP (!) 161/66   Pulse 87   Temp (!) 95.5 F (35.3 C)   Resp (!) 30   Ht 5' 5.98" (1.676 m)   Wt 119.3 kg   SpO2 97%   BMI 42.47 kg/m  Pain Scale: CPOT       SpO2: SpO2: 97 % O2 Device:SpO2: 97 % O2 Flow Rate: .   IO: Intake/output summary:  Intake/Output Summary (Last 24 hours) at 08/23/2023 1339 Last data filed at 08/23/2023 1200 Gross per 24 hour  Intake 3221.47 ml  Output 120 ml  Net 3101.47 ml    LBM: Last BM Date : 08/22/23 Baseline Weight: Weight: 113.8 kg Most recent weight: Weight: 119.3 kg     Palliative Assessment/Data:     Time :  75 minutes  Discussed with neurology, nephrology, bedside RN    Signed by: Lorinda Creed, NP   Please contact Palliative Medicine Team phone at [phone] for questions and concerns.  For individual provider: See Loretha Stapler

## 2023-08-23 NOTE — Progress Notes (Addendum)
Short runs of SVT noted throughout AM with HR up to 160, non-sustained. Per cardiac review, pt has been having episodes. CCM MD notified on unit 0830.   Pt continues to have little UOP. 80mg  IV lasix ordered, minimal UOP. CCM and Nephro MD aware. Discussions with family regarding goals of care.   1600 Patient SBP increasing 170-190--notified stroke NP for PRN. Patient then with increased WOB, RR, dyschronous on vent, SBP now >200. Fent gtt restarted with bolus given. Cleviprex gtt ordered and started.  Neurologic status remains unchanged.

## 2023-08-23 NOTE — Progress Notes (Signed)
eLink Physician-Brief Progress Note Patient Name: Jill Barnes DOB: [birth year] MRN: [id number]   Date of Service  08/23/2023  HPI/Events of Note  62 year old female with diabetes, COPD, and previous DVT who presented with right MCA stroke and left-sided weakness with left hemiaplasia and right-sided gaze deviation.  Minimal neurological recovery.  Worsening renal function.  Ongoing intermittent SVT.  K5.9.  eICU Interventions  Calcium infusion, bicarb infusion  Anticipate transition to comfort care tomorrow, continue observation     Intervention Category Intermediate Interventions: Arrhythmia - evaluation and management  Eytan Carrigan 08/23/2023, 10:02 PM

## 2023-08-23 NOTE — Progress Notes (Signed)
Nutrition Brief Note  Noted TF changed to Nepro due to increasing potassium.  Spoke with Renal, will adjust TF rate to prevent over feeding patient.  Nepro @ 35 ml/hr with 60 ml ProSource TF20 daily provides: 1592 kcal, 88 grams protein, and 610 ml free water.   Noted pt's husband having conversations with Palliative for GOC.   Cammy Copa., RD, LDN, CNSC See AMiON for contact information

## 2023-08-23 NOTE — Progress Notes (Addendum)
STROKE TEAM PROGRESS NOTE   BRIEF HPI [name] is a 62 y.o. adult with history of smoking, COPD, DVT, diabetes, kidney stones and right MCA stroke presenting with sudden onset left hemiplegia right gaze deviation and left-sided neglect.  She was found to have a right MCA M1 occlusion.  She presented outside the TNK window, but was taken to IR for mechanical thrombectomy.  Revascularization was achieved with 1 pass.  She remains intubated postprocedure and has been acidotic and hypercarbic.  She likely aspirated at some point.  MRI 9/30 demonstrates right MCA stroke with some hemorrhagic transformation. Patient with likely AKI. Nephroogy consulted 9/30.   SIGNIFICANT HOSPITAL EVENTS 9/27: Patient admitted with right MCA stroke, mechanical thrombectomy performed 10/1: Discussion with family members about patient's previous wishes held, patient made DNR  INTERIM HISTORY/SUBJECTIVE Patient is seen in her room with her daughter at the bedside.  She remains intubated, but sedation is off and neurological exam remains poor.  Renal function is worsening, and patient likely will need dialysis within the next few days.  Discussion of patient's condition and prognosis was held with daughter, and plan is for family meeting with palliative care to discuss goals of care and likely transition to comfort care soon.  Patient's daughter and husband stated that patient had previously stated she would wish to become a DNR, and given this her CODE STATUS was changed today.   OBJECTIVE  CBC    Component Value Date/Time   WBC 9.2 08/23/2023 0706   RBC 2.72 (L) 08/23/2023 0706   HGB 8.8 (L) 08/23/2023 0706   HCT 28.5 (L) 08/23/2023 0706   PLT 159 08/23/2023 0706   MCV 104.8 (H) 08/23/2023 0706   MCH 32.4 08/23/2023 0706   MCHC 30.9 08/23/2023 0706   RDW 15.9 (H) 08/23/2023 0706   LYMPHSABS 1.0 08/23/2023 0706   MONOABS 0.9 08/23/2023 0706   EOSABS 0.1 08/23/2023 0706   BASOSABS 0.0 08/23/2023  0706    BMET    Component Value Date/Time   NA 134 (L) 08/23/2023 0706   K 6.1 (H) 08/23/2023 0706   CL 104 08/23/2023 0706   CO2 16 (L) 08/23/2023 0706   GLUCOSE 239 (H) 08/23/2023 0706   BUN 69 (H) 08/23/2023 0706   CREATININE 6.42 (H) 08/23/2023 0706   CALCIUM 7.5 (L) 08/23/2023 0706   GFRNONAA 7 (L) 08/23/2023 0706    IMAGING past 24 hours DG CHEST PORT 1 VIEW  Result Date: 08/23/2023 CLINICAL DATA:  Acute hypercapnic respiratory failure. EXAM: PORTABLE CHEST 1 VIEW COMPARISON:  07/30/2023; chest CT-08/04/2023 FINDINGS: Grossly unchanged cardiac silhouette and mediastinal contours. Endotracheal tube overlies the tracheal air column with tip superior to the carina. Interval placement of enteric tube with tip and side port projecting below the left hemidiaphragm. Esophageal pH probe overlies the thoracic inlet. Improved aeration of the left lung with worsening right mid and lower lung heterogeneous opacities. Left basilar heterogeneous opacities persist. Trace bilateral effusions are not excluded. No pneumothorax. No acute osseous abnormalities. IMPRESSION: 1. Support apparatus as described without evidence of pneumothorax. 2. Improved aeration of the left lung with worsening right mid and lower lung heterogeneous opacities, nonspecific though favored to represent shifting atelectasis and/or alveolar pulmonary edema. Electronically Signed   By: Simonne Come M.D.   On: 08/23/2023 11:08    Vitals:   08/23/23 0900 08/23/23 1000 08/23/23 1100 08/23/23 1200  BP: (!) 137/57 (!) 145/57 (!) 147/59 (!) 161/66  Pulse: 88 89 83 87  Resp: Marland Kitchen)  24 (!) 29 (!) 27 (!) 30  Temp: 98.4 F (36.9 C) 99 F (37.2 C) 98.8 F (37.1 C) (!) 95.5 F (35.3 C)  TempSrc:      SpO2: 96% 96% 98% 97%  Weight:      Height:         PHYSICAL EXAM  Temp:  [95.5 F (35.3 C)-99.9 F (37.7 C)] 95.5 F (35.3 C) (10/01 1200) Pulse Rate:  [80-100] 87 (10/01 1200) Resp:  [16-35] 30 (10/01 1200) BP:  (96-161)/(48-69) 161/66 (10/01 1200) SpO2:  [91 %-100 %] 97 % (10/01 1200) FiO2 (%):  [40 %] 40 % (10/01 1111) Weight:  [119.3 kg] 119.3 kg (10/01 0500)  General - Well nourished, well developed, intubated without sedation  Ophthalmologic - fundi not visualized due to noncooperation.  Cardiovascular - Regular rate and rhythm.  Neuro - intubated on sedation, eyes closed, eyes do not open to voice, touch or noxious stimuli.  She is unable to follow commands.  Pupils are sluggishly reactive with positive doll's eyes reflex.  Corneal reflex present on the right but not on the left cough and gag present.  Patient will withdraw right upper and lower extremities to sternal rub and will flicker left lower extremity to sternal rub, no movement of left upper extremity.   ASSESSMENT/PLAN  Stroke: right MCA large and left MCA/ACA punctate infarcts with right M1 occlusion s/p IR with TICI3, complicated with large hemorrhagic conversion, etiology: Large vessel occlusion vs. Cardioembolic source Code Stroke CT head subtly decreased attenuation at level of right frontal operculum, suspicious for right MCA territory infarct ASPECTS 9 CTA head & neck occlusion of distal right M1 segment, carotid stenosis of 65 to 70% on the left and 30 to 40% on the right  MRI right MCA territory infarct with some hemorrhagic transformation, additional punctate infarcts in left MCA/ACA territory CT repeat 9/28 Combined intra-axial hematoma, 29 mL (right insula and operculum) and small to moderate volume right hemisphere subarachnoid hemorrhage, trace intraventricular blood. CT repeat 9/29 blood redistribution but otherwise, no significant change  2D Echo EF 60-65% LE venous doppler no DVT Direct LDL 59, TG 416 HgbA1c 7.5 UDS neg VTE prophylaxis - heparin subq aspirin 81 mg daily prior to admission, now on No antithrombotic now given significant hemorrhagic transformation. Disposition: Pending, Palliative care consult 10/1  for goals of care conversation with family given lack of improvement in neuroexam with sedation off and deteriorating renal function  Respiratory failure and aspiration pneumonia Patient left intubated postprocedure Concern for aspiration event prior to intubation High vent settings preclude possibility of extubation or positive sedation for exam Leukocytosis WBC 13.4->10.7->9.8-> 9.2 Unasyn for possible aspiration pneumonia Ventilator management per CCM -> on weaning  Renal failure Cre 1.17->1.19-> 2.05 > 4.16 -> 6.42 Oligouria  S/p lasix yesterday and today Consulted Nephorology 9/30 Patient is poor candidate for dialysis given poor neuroexam and general condition, palliative care consulted 10/1  SVT Intermittent tachycardia overnight EKG showed sinus tachycardia  Currently NSR Close monitoring  Hx of Stroke/TIA 09/2020 admitted for right M2 occlusion, CT and MRI showed moderate to large right MCA infarct, as well as left parietal, right cerebellum, right splenium small infarcts. CTA head and neck showed right M2 occlusion, b/l ICA 60% stenosis L>R. EF 60-65%, LDL 125 and A1C 9.1. discharged on DAPT and lipitor 20.  Hx of PE and DVT  Occurred in 06/2020 due to COVID pneumonia  This admission CTA no PE LE venous doppler negative this admission  History of  hypertension now hypotensive Home meds: Lisinopril 5 mg daily Unstable, requiring levophed Now BP goal 120-160 Long term BP goal normotensive  Hyperlipidemia Home meds: Atorvastatin 20 mg daily  LDL 59, goal < 70 On lipitor 20 No high intensity statin due to LDL at goal Continue statin at discharge  Diabetes type II Uncontrolled Home meds: Insulin NPH 130 units in the morning and 35 units in the evening HgbA1c 7.5, goal < 7.0 CBGs SSI Now on insulin Recommend close follow-up with PCP for better DM control  Tobacco Abuse Patient smokes 1 packs per day Will discuss nicotine replacement therapy when patient is  extubated  Dysphagia Remains intubated NPO OG in place with TF  Other Stroke Risk Factors Obesity, Body mass index is 42.47 kg/m., BMI >/= 30 associated with increased stroke risk, recommend weight loss, diet and exercise as appropriate  Migraines  Other Active Problems 1.1 cm left parotid lesion-recommend outpatient ENT follow-up  Hospital day # 4  Patient seen by NP with MD, MD to edit note as needed. Cortney E Ernestina Columbia , MSN, FNP-C [facility] See Amion for schedule and pager information 08/23/2023 1:09 PM  ATTENDING NOTE: I reviewed above note and agree with the assessment and plan. Pt was seen and examined.   Daughter and RN are at the bedside. Pt still intubated, now off sedation, no agitation and not as responsive as before. Not open eyes on voice, with forced eye opening, eyes midline, pupils 3mm, sluggish to light, not tracking, not blinking to visual threat. Still has doll's eyes, corneal positive on the right, absent on the left. Gag reflex present. Mild withdraw to right UE and LE with pain, flaccid on the left.   Cre continues to elevated significantly, today 6.42, nephrology on board, K 6.1, minimal urine output. Pt not candidate for dialysis with large stroke and significant neuro deficit. Palliative care consulted. Had long discussion with daughter and she is asking about comfort care measures, but will need to discussion with pt husband and son before any decision. D/c IVF and TF given AKI and oliguria.   For detailed assessment and plan, please refer to above/below as I have made changes wherever appropriate.   Marvel Plan, MD PhD Stroke Neurology 08/23/2023 4:30 PM  This patient is critically ill due to right MCA infarct with hemorrhagic conversion, respiratory failure, uncontrolled DM, dysphagia and at significant risk of neurological worsening, death form recurrent stroke, hematoma expansion, DKA, aspiration, sepsis. This patient's care requires  constant monitoring of vital signs, hemodynamics, respiratory and cardiac monitoring, review of multiple databases, neurological assessment, discussion with family, other specialists and medical decision making of high complexity. I spent 45 minutes of neurocritical care time in the care of this patient. I had long discussion with daughter at bedside, updated pt current condition, treatment plan and potential poor prognosis, and answered all the questions. She expressed understanding and appreciation.   To contact Stroke Continuity provider, please refer to [email or web address]. After hours, contact General Neurology

## 2023-08-23 NOTE — Progress Notes (Signed)
NAME:  Jill Barnes, MRN:  [id number], DOB:  1960-12-25, LOS: 4 ADMISSION DATE:  Aug 24, 2023, CONSULTATION DATE:  09/12/2023 REFERRING MD:  Dr. Derry Lory - stroke, CHIEF COMPLAINT:  Vent management    History of Present Illness:   Jill Barnes is a 62 year old female with a past medical history significant for COPD, diabetes, smoker, remote history of DVT, and prior right MCA stroke with residual left-sided weakness who presented to the ED with left hemiplegia with neglect of left side and right gaze deviation.  Last known normal 2300 on 9/26, code stroke initiated.  CT head on admission concerning for right MCA territory infarct, CTA head and neck positive for large vessel occlusion resulting in emergent consult interventional IR for thrombectomy.  Patient decompensation post CT with hypoxia resulting in emergent intubation.  Post intubation carotid arteriogram was preformed for occluded right middle cerebral artery with complete revascularization but observed contrast extravasation on follow-up angio.  Concern for aspiration event prior to intubation given high vent requirements therefore postprocedure patient was left intubated and PCCM was consulted for further assistance in management.  Pertinent  Medical History  COPD, diabetes, smoker, remote history of DVT, and prior right MCA stroke with residual left-sided weakness   Significant Hospital Events: Including procedures, antibiotic start and stop dates in addition to other pertinent events   08/25/2023 presented with left-sided hemiplegia and right gaze deviation in the setting of acute ischemic stroke.  Thrombectomy per IR, left intubated postprocedure 9/28 intermittent episodes of hypotension correlated to increased sedation requirement night.  Interim History / Subjective:  Patient's mental status remained poor Serum creatinine continued to increase Remain oliguric  Objective   Blood pressure (!) 120/52, pulse 86, temperature 98.6 F  (37 C), temperature source Esophageal, resp. rate (!) 22, height 5' 5.98" (1.676 m), weight 119.3 kg, SpO2 94%.    Vent Mode: PSV;CPAP FiO2 (%):  [40 %] 40 % Set Rate:  [24 bmp] 24 bmp Vt Set:  [460 mL] 460 mL PEEP:  [5 cmH20] 5 cmH20 Pressure Support:  [8 cmH20-10 cmH20] 8 cmH20 Plateau Pressure:  [18 cmH20-28 cmH20] 20 cmH20   Intake/Output Summary (Last 24 hours) at 08/23/2023 0831 Last data filed at 08/23/2023 0800 Gross per 24 hour  Intake 4773.06 ml  Output 100 ml  Net 4673.06 ml   Filed Weights   August 24, 2023 0300 08/22/23 0427 08/23/23 0500  Weight: 113.8 kg 118.5 kg 119.3 kg   Examination: General: Crtitically ill-appearing morbidly obese female, orally intubated HEENT: [place]/AT, eyes icteric.  ETT and OGT in place Neuro: Eyes closed, does not open, not following commands Chest: Reduced air entry at the bases bilaterally, no wheezes or rhonchi Heart: Regular rate and rhythm, no murmurs or gallops Abdomen: Soft, nondistended, bowel sounds present Skin: No rash  Labs and images reviewed  Resolved Hospital Problem list     Assessment & Plan:  Acute right MCA stroke status post mechanical thrombectomy Complicated with postprocedure subarachnoid hemorrhage with IVH and right parietal for intraparenchymal hemorrhage Prior right MCA stroke with residual left-sided weakness Continue neuro watch every hour Stroke team is following Continue secondary stroke prophylaxis Hold antiplatelet and anticoagulation in the setting of subarachnoid intraparenchymal hemorrhage Maintain SBP <160  Acute respiratory failure with hypoxia and hypercapnia Aspiration pneumonia COPD, not in exacerbation Tobacco dependence Continue lung protective ventilation VAP prevention bundle in place PAD protocol with as needed fentanyl She is off sedation now Hypercapnia has cleared Continue IV Unasyn Smoking cessation counseling when appropriate  Chronic  HFpEF Echo 09/12/23 EF 60 to 65% with no WMA and  grade 2 diastolic dysfunction. Monitor intake and output Will give Lasix challenge  Paroxysmal SVT Patient is having short runs of SVT with heart rate in 120s-140 Closely monitor electrolytes  Diabetes type 2 with hyperglycemia Hemoglobin A1c 7.5 Patient blood sugars are not well-controlled Change Semglee to twice daily Continue sliding scale insulin with CBG goal 140-180  Anemia of critical illness Monitor H&H and transfuse if less than 7  .  On CKD stage 3a Hyperkalemia Hypervolemic hyponatremia Acute metabolic acidosis Serum creatinine continue to rise, currently at 6.1, patient became oliguric Serum bicarbonate trended down to 16 Nephrology is following Will stop IV fluid Started on aggressive diuresis Avoid nephrotoxic agents  Morbid obesity Diet and exercise counseling when appropriate  Best Practice (right click and "Reselect all SmartList Selections" daily)   Diet/type: NPO DVT prophylaxis: SCD GI prophylaxis: PPI Lines: N/A Foley:  Yes, and it is still needed Code Status:  full code Last date of multidisciplinary goals of care discussion: Per primary team  The patient is critically ill due to acute respiratory failure with hypoxia and hypercapnia/aspiration pneumonia/acute right MCA stroke.  Critical care was necessary to treat or prevent imminent or life-threatening deterioration.  Critical care was time spent personally by me on the following activities: development of treatment plan with patient and/or surrogate as well as nursing, discussions with consultants, evaluation of patient's response to treatment, examination of patient, obtaining history from patient or surrogate, ordering and performing treatments and interventions, ordering and review of laboratory studies, ordering and review of radiographic studies, pulse oximetry, re-evaluation of patient's condition and participation in multidisciplinary rounds.   During this encounter critical care time was  devoted to patient care services described in this note for 41 minutes.     Cheri Fowler, MD [facility] See Amion for pager If no response to pager, please call [phone] until 7pm After 7pm, Please call E-link [phone]

## 2023-08-23 DEATH — deceased

## 2023-08-24 DIAGNOSIS — R0609 Other forms of dyspnea: Secondary | ICD-10-CM

## 2023-08-24 DIAGNOSIS — I63511 Cerebral infarction due to unspecified occlusion or stenosis of right middle cerebral artery: Secondary | ICD-10-CM

## 2023-08-24 DIAGNOSIS — N179 Acute kidney failure, unspecified: Secondary | ICD-10-CM

## 2023-08-24 LAB — BASIC METABOLIC PANEL
Anion gap: 19 — ABNORMAL HIGH (ref 5–15)
BUN: 90 mg/dL — ABNORMAL HIGH (ref 8–23)
CO2: 18 mmol/L — ABNORMAL LOW (ref 22–32)
Calcium: 8.1 mg/dL — ABNORMAL LOW (ref 8.9–10.3)
Chloride: 102 mmol/L (ref 98–111)
Creatinine, Ser: 7.73 mg/dL — ABNORMAL HIGH (ref 0.44–1.00)
GFR, Estimated: 6 mL/min — ABNORMAL LOW (ref >60)
Glucose, Bld: 259 mg/dL — ABNORMAL HIGH (ref 70–99)
Potassium: 5.6 mmol/L — ABNORMAL HIGH (ref 3.5–5.1)
Sodium: 139 mmol/L (ref 135–145)

## 2023-08-24 LAB — CBC WITH DIFFERENTIAL/PLATELET
Abs Immature Granulocytes: 0.49 10*3/uL — ABNORMAL HIGH (ref 0.00–0.07)
Basophils Absolute: 0.1 10*3/uL (ref 0.0–0.1)
Basophils Relative: 1 %
Eosinophils Absolute: 0.1 10*3/uL (ref 0.0–0.5)
Eosinophils Relative: 1 %
HCT: 29.2 % — ABNORMAL LOW (ref 36.0–46.0)
Hemoglobin: 9 g/dL — ABNORMAL LOW (ref 12.0–15.0)
Immature Granulocytes: 5 %
Lymphocytes Relative: 9 %
Lymphs Abs: 0.9 10*3/uL (ref 0.7–4.0)
MCH: 31.8 pg (ref 26.0–34.0)
MCHC: 30.8 g/dL (ref 30.0–36.0)
MCV: 103.2 fL — ABNORMAL HIGH (ref 80.0–100.0)
Monocytes Absolute: 1 10*3/uL (ref 0.1–1.0)
Monocytes Relative: 10 %
Neutro Abs: 7.6 10*3/uL (ref 1.7–7.7)
Neutrophils Relative %: 74 %
Platelets: 164 10*3/uL (ref 150–400)
RBC: 2.83 MIL/uL — ABNORMAL LOW (ref 3.87–5.11)
RDW: 15.6 % — ABNORMAL HIGH (ref 11.5–15.5)
WBC: 10.1 10*3/uL (ref 4.0–10.5)
nRBC: 0.7 % — ABNORMAL HIGH (ref 0.0–0.2)

## 2023-08-24 LAB — GLUCOSE, CAPILLARY
Glucose-Capillary: 256 mg/dL — ABNORMAL HIGH (ref 70–99)
Glucose-Capillary: 191 mg/dL — ABNORMAL HIGH (ref 70–99)
Glucose-Capillary: 173 mg/dL — ABNORMAL HIGH (ref 70–99)

## 2023-08-24 LAB — TRIGLYCERIDES: Triglycerides: 194 mg/dL — ABNORMAL HIGH (ref <150)

## 2023-08-24 MED ORDER — GLYCOPYRROLATE 0.2 MG/ML IJ SOLN: 0.4000 mg | Freq: Four times a day (QID) | INTRAMUSCULAR | Status: DC

## 2023-08-24 MED ORDER — GLYCOPYRROLATE 0.2 MG/ML IJ SOLN: 0.2000 mg | INTRAMUSCULAR | Status: DC | PRN

## 2023-08-24 MED ORDER — BIOTENE DRY MOUTH MT LIQD: 15.0000 mL | OROMUCOSAL | Status: DC | PRN

## 2023-08-24 MED ORDER — SODIUM ZIRCONIUM CYCLOSILICATE 10 G PO PACK
10.0000 g | PACK | Freq: Once | ORAL | Status: AC
  Administered 2023-08-24: 10 g
  Filled 2023-08-24: qty 1

## 2023-08-24 MED ORDER — LORAZEPAM 2 MG/ML IJ SOLN: 2.0000 mg | INTRAMUSCULAR | Status: DC | PRN

## 2023-08-24 MED ORDER — ONDANSETRON HCL 4 MG/2ML IJ SOLN: 4.0000 mg | Freq: Four times a day (QID) | INTRAMUSCULAR | Status: DC | PRN

## 2023-08-24 MED ORDER — HALOPERIDOL LACTATE 5 MG/ML IJ SOLN: 0.5000 mg | INTRAMUSCULAR | Status: DC | PRN

## 2023-08-24 MED ORDER — POLYVINYL ALCOHOL 1.4 % OP SOLN: 1.0000 [drp] | Freq: Four times a day (QID) | OPHTHALMIC | Status: DC | PRN

## 2023-08-24 MED ORDER — HALOPERIDOL 0.5 MG PO TABS: 0.5000 mg | ORAL_TABLET | ORAL | Status: DC | PRN

## 2023-08-24 MED ORDER — ONDANSETRON 4 MG PO TBDP: 4.0000 mg | ORAL_TABLET | Freq: Four times a day (QID) | ORAL | Status: DC | PRN

## 2023-08-24 MED ORDER — HALOPERIDOL LACTATE 2 MG/ML PO CONC: 0.5000 mg | ORAL | Status: DC | PRN

## 2023-08-24 MED ORDER — GLYCOPYRROLATE 1 MG PO TABS: 1.0000 mg | ORAL_TABLET | ORAL | Status: DC | PRN

## 2023-08-24 MED ORDER — GLYCOPYRROLATE 0.2 MG/ML IJ SOLN
0.4000 mg | Freq: Four times a day (QID) | INTRAMUSCULAR | Status: DC
  Filled 2023-08-24: qty 2

## 2023-08-24 MED ORDER — INSULIN GLARGINE-YFGN 100 UNIT/ML ~~LOC~~ SOLN
35.0000 [IU] | Freq: Two times a day (BID) | SUBCUTANEOUS | Status: DC
  Administered 2023-08-24: 35 [IU] via SUBCUTANEOUS
  Filled 2023-08-24 (×2): qty 0.35

## 2023-09-16 ENCOUNTER — Ambulatory Visit: Payer: Self-pay | Admitting: Cardiology

## 2023-09-23 NOTE — Progress Notes (Addendum)
STROKE TEAM PROGRESS NOTE   BRIEF HPI [name] is a 62 y.o. adult with history of smoking, COPD, DVT, diabetes, kidney stones and right MCA stroke presenting with sudden onset left hemiplegia right gaze deviation and left-sided neglect.  She was found to have a right MCA M1 occlusion.  She presented outside the TNK window, but was taken to IR for mechanical thrombectomy.  Revascularization was achieved with 1 pass.  She remains intubated postprocedure and has been acidotic and hypercarbic.  She likely aspirated at some point.  MRI 9/30 demonstrates right MCA stroke with some hemorrhagic transformation. Patient with likely AKI. Nephroogy consulted 9/30.   SIGNIFICANT HOSPITAL EVENTS 08/21/2023: Patient admitted with right MCA stroke, mechanical thrombectomy performed 10/1: Discussion with family members about patient's previous wishes held, patient made DNR 10/2: Family meeting planned with palliative care  INTERIM HISTORY/SUBJECTIVE Patient is seen in her room with her daughter at the bedside.  She remains intubated, but sedation is off and neurological exam remains poor.  Renal function is worsening, and patient likely will need dialysis within the next few days.  Plan is for family meeting with palliative care to discuss goals of care and likely transition to comfort care when patient's husband arrives at hospital.  .   OBJECTIVE  CBC    Component Value Date/Time   WBC 10.1 08/23/2023 0426   RBC 2.83 (L) 09/14/2023 0426   HGB 9.0 (L) 08/28/2023 0426   HCT 29.2 (L) 09/19/2023 0426   PLT 164 09/15/2023 0426   MCV 103.2 (H) 08/27/2023 0426   MCH 31.8 09/03/2023 0426   MCHC 30.8 09/04/2023 0426   RDW 15.6 (H) 09/05/2023 0426   LYMPHSABS 0.9 09/07/2023 0426   MONOABS 1.0 09/09/2023 0426   EOSABS 0.1 09/12/2023 0426   BASOSABS 0.1 09/18/2023 0426    BMET    Component Value Date/Time   NA 139 09/02/2023 0426   K 5.6 (H) 08/25/2023 0426   CL 102 08/27/2023 0426   CO2 18 (L)  09/05/2023 0426   GLUCOSE 259 (H) 09/03/2023 0426   BUN 90 (H) 09/13/2023 0426   CREATININE 7.73 (H) 09/22/2023 0426   CALCIUM 8.1 (L) 09/09/2023 0426   GFRNONAA 6 (L) 09/20/2023 0426    IMAGING past 24 hours No results found.  Vitals:   08/25/2023 0600 09/05/2023 0700 09/03/2023 0800 09/17/2023 1200  BP: 135/60 130/71 (!) 119/58   Pulse: 73 72 70   Resp: (!) 24 (!) 24 (!) 24   Temp:   98.6 F (37 C) 98.5 F (36.9 C)  TempSrc:   Oral Axillary  SpO2: 98% 100% 100%   Weight:      Height:         PHYSICAL EXAM  Temp:  [97.9 F (36.6 C)-99.5 F (37.5 C)] 98.5 F (36.9 C) (10/02 1200) Pulse Rate:  [70-133] 70 (10/02 0800) Resp:  [2-41] 24 (10/02 0800) BP: (101-210)/(29-88) 119/58 (10/02 0800) SpO2:  [90 %-100 %] 100 % (10/02 0800) FiO2 (%):  [40 %-50 %] 40 % (10/02 1048) Weight:  [121.6 kg] 121.6 kg (10/02 0500)  General - Well nourished, well developed, intubated on Fentanyl gtt only.   Ophthalmologic - fundi not visualized due to noncooperation.  Cardiovascular - Regular rate and rhythm.  Neuro - intubated on sedation, eyes closed, eyes do not open to voice, touch or noxious stimuli.  She is unable to follow commands.  Pupils are pinpoint and non-reactive with positive doll's eyes reflex.  Corneal reflex present  on the right but not on the left cough and gag present.  Patient will very slightly withdraw right upper and lower extremities and LLE to noxious stimuli. No movement of left upper extremity.   ASSESSMENT/PLAN  Stroke: right MCA large and left MCA/ACA punctate infarcts with right M1 occlusion s/p IR with TICI3, complicated with large hemorrhagic conversion, etiology: Large vessel occlusion vs. Cardioembolic source Code Stroke CT head subtly decreased attenuation at level of right frontal operculum, suspicious for right MCA territory infarct ASPECTS 9 CTA head & neck occlusion of distal right M1 segment, carotid stenosis of 65 to 70% on the left and 30 to 40% on the  right  MRI right MCA territory infarct with some hemorrhagic transformation, additional punctate infarcts in left MCA/ACA territory CT repeat 9/28 Combined intra-axial hematoma, 29 mL (right insula and operculum) and small to moderate volume right hemisphere subarachnoid hemorrhage, trace intraventricular blood. CT repeat 9/29 blood redistribution but otherwise, no significant change  2D Echo EF 60-65% LE venous doppler no DVT Direct LDL 59, TG 416 HgbA1c 7.5 UDS neg VTE prophylaxis - heparin subq aspirin 81 mg daily prior to admission, now on No antithrombotic now given significant hemorrhagic transformation. Disposition: Pending, Palliative care consulted for GOC discussion with family given lack of improvement in neuroexam with sedation off and deteriorating renal function. Pending meeting today.   Respiratory failure and aspiration pneumonia Patient left intubated postprocedure Concern for aspiration event prior to intubation High vent settings preclude possibility of extubation or positive sedation for exam Leukocytosis WBC 13.4->10.7->9.8-> 9.2 ->10.1 Unasyn for possible aspiration pneumonia Ventilator management per CCM -> on weaning  Renal failure Cre 1.17->1.19-> 2.05 > 4.16 -> 6.42 -> 7.73 Oligouria  S/p lasix x 2 previous 2 days Consulted Nephorology 9/30 - appreciate help Patient is poor candidate for dialysis given poor neuroexam and general condition, palliative care consulted and family leaning towards comfort  SVT Intermittent tachycardia episodes, not sustained EKG showed sinus tachycardia  Currently NSR Close monitoring  Hx of Stroke/TIA 09/2020 admitted for right M2 occlusion, CT and MRI showed moderate to large right MCA infarct, as well as left parietal, right cerebellum, right splenium small infarcts. CTA head and neck showed right M2 occlusion, b/l ICA 60% stenosis L>R. EF 60-65%, LDL 125 and A1C 9.1. discharged on DAPT and lipitor 20.  Hx of PE and DVT   Occurred in 06/2020 due to COVID pneumonia  This admission CTA no PE LE venous doppler negative this admission  History of hypertension now hypotensive Home meds: Lisinopril 5 mg daily Unstable, requiring levophed Now BP goal 120-160 Long term BP goal normotensive  Hyperlipidemia Home meds: Atorvastatin 20 mg daily  LDL 59, goal < 70 On lipitor 20 No high intensity statin due to LDL at goal Continue statin at discharge  Diabetes type II Uncontrolled Home meds: Insulin NPH 130 units in the morning and 35 units in the evening HgbA1c 7.5, goal < 7.0 CBGs SSI Now on insulin Recommend close follow-up with PCP for better DM control  Tobacco Abuse Patient smokes 1 packs per day Will discuss nicotine replacement therapy when patient is extubated  Dysphagia Remains intubated NPO OG in place with TF  Other Stroke Risk Factors Obesity, Body mass index is 43.29 kg/m., BMI >/= 30 associated with increased stroke risk, recommend weight loss, diet and exercise as appropriate  Migraines  Other Active Problems 1.1 cm left parotid lesion-recommend outpatient ENT follow-up  Hospital day # 5   Pt seen by Neuro  NP/APP and later by MD. Note/plan to be edited by MD as needed.    Lynnae January, DNP, AGACNP-BC [facility] Please use AMION for contact information & EPIC for messaging.  ATTENDING NOTE: I reviewed above note and agree with the assessment and plan. Pt was seen and examined.   Daughter at the bedside. Pt still intubated and Cre continues to rise. Minimal urine outpt. Not candidate for dialysis. Pt barely open eyes on voice, pupils 2mm, sluggish to light, doll's eyes sluggish, left corneal absent, still has cough and gag but weaker. Left hemiplegia. Some purposeful movement at right. Palliative care on board, pending family meeting today and family leaning towards comfort given no neuro improvement and worsening renal failure. Appreciate nephrology, CCM and  palliative care assistance.   For detailed assessment and plan, please refer to above/below as I have made changes wherever appropriate.   Marvel Plan, MD PhD Stroke Neurology 09/18/2023 4:12 PM  This patient is critically ill due to right MCA infarct with hemorrhagic conversion, respiratory failure, uncontrolled DM, renal failure and at significant risk of neurological worsening, death form recurrent stroke, hematoma expansion, DKA, aspiration, sepsis, cardiac arrest. This patient's care requires constant monitoring of vital signs, hemodynamics, respiratory and cardiac monitoring, review of multiple databases, neurological assessment, discussion with family, other specialists and medical decision making of high complexity. I spent of neurocritical care time in the care of this patient. I had long discussion with daughter at bedside, updated pt current condition, treatment plan and potential poor prognosis, and answered all the questions. She expressed understanding and appreciation.    To contact Stroke Continuity provider, please refer to [email or web address]. After hours, contact General Neurology

## 2023-09-23 NOTE — Progress Notes (Signed)
Patient ID: [name], adult   DOB: [birth year], 62 y.o.   MRN: [id number]    Progress Note from the Palliative Medicine Team at [facility]   Patient Name: Jill Barnes        Date: 09/16/2023 DOB: 01/08/1961  Age: 62 y.o. MRN#: [id number] Attending Physician: Stroke, Md, MD Primary Care Physician: Jill Shiver, DO Admit Date: 09/08/23   Reason for Consultation/Follow-up   Establishing Goals of Care   HPI/ Brief Hospital Review  62 y.o. adult  admitted on 2023/09/08 with , diabetesa past medical history significant for COPD, smoker,Remote history of DVT, and prior right MCA stroke with residual left-sided weakness who presented to the ED with left hemiaplasia with neglect of the left side and right gaze deviation.  CT head on admission concerning for right MCA territory infarct, CTA head and neck positive for large vessel occlusion resulting in emergent consult to interventional radiology for thrombectomy.  Patient decompensated post CT with hypoxia resulting in emergent intubation.  Postintubation carotid arteriogram was performed for occluded right middle cerebral artery with complete revascularization but observed extravasation on follow-up angio.  Concern for aspiration event prior to intubation, high vent requirements.   Worsening renal function, nephrology consulted.  Creatinine today 7.73.   In conversation today with Dr. Maudry Barnes family has expressed the patient would never desire initiation of dialysis and would not want to be kept alive by machines.    Family face treatment option decisions, advanced directive decisions and anticipatory care needs.      Subjective  Extensive chart review has been completed prior to meeting with patient/family  including labs, vital signs, imaging, progress/consult notes, orders, medications and available advance directive documents.    This NP assessed patient at the bedside as a follow up to  yesterday's GOCs meeting,  and to meet with family for continued conversation regarding liberation from the ventilator.  Family present today are patient's husband, son and daughter/Jill Barnes.  We met outside the room for continued conversation regarding current medical situation.  All family understand the seriousness of patient's current medical situation and the long-term grim prognosis.  All 3 family members have a strong sense of knowing that the best thing for Jill Barnes is to liberate her from the ventilator today allowing for natural death.   Education offered on the process of extubation.    Education offered on the utilization of medications to treat symptoms at end-of-life.  Patient's husband and son verbalized that they do not want to be in the patient's room at this time.    Emotional support offered  Once patient is extubated her daughter Jill Barnes wishes to return to the bedside,  Education offered on the natural trajectory and expectations at end-of-life.     Plan of care -DNR DNI -One-way extubation-will place orders for extubation and symptom management        Focus of care is comfort and dignity allowing for natural death - Symptom management to enhance comfort at end of life -Expect hospital death   Questions and concerns addressed   Discussed with primary team and nursing staff   Time: 61   minutes  Detailed review of medical records ( labs, imaging, vital signs), medically appropriate exam ( MS, skin, cardiac,  resp)   discussed with treatment team, counseling and education to patient, family, staff, documenting clinical information, medication management, coordination of care    Jill Creed NP  Palliative Medicine Team Team Phone # [phone] Pager [phone]

## 2023-09-23 NOTE — Progress Notes (Signed)
NAME:  Jill Barnes, MRN:  [id number], DOB:  03-09-1961, LOS: 5 ADMISSION DATE:  08/10/2023, CONSULTATION DATE:  08/05/2023 REFERRING MD:  Dr. Derry Lory - stroke, CHIEF COMPLAINT:  Vent management    History of Present Illness:   Jill Barnes is a 62 year old female with a past medical history significant for COPD, diabetes, smoker, remote history of DVT, and prior right MCA stroke with residual left-sided weakness who presented to the ED with left hemiplegia with neglect of left side and right gaze deviation.  Last known normal 2300 on 9/26, code stroke initiated.  CT head on admission concerning for right MCA territory infarct, CTA head and neck positive for large vessel occlusion resulting in emergent consult interventional IR for thrombectomy.  Patient decompensation post CT with hypoxia resulting in emergent intubation.  Post intubation carotid arteriogram was preformed for occluded right middle cerebral artery with complete revascularization but observed contrast extravasation on follow-up angio.  Concern for aspiration event prior to intubation given high vent requirements therefore postprocedure patient was left intubated and PCCM was consulted for further assistance in management.  Pertinent  Medical History  COPD, diabetes, smoker, remote history of DVT, and prior right MCA stroke with residual left-sided weakness   Significant Hospital Events: Including procedures, antibiotic start and stop dates in addition to other pertinent events   9/27 presented with left-sided hemiplegia and right gaze deviation in the setting of acute ischemic stroke.  Thrombectomy per IR, left intubated postprocedure 9/28 intermittent episodes of hypotension correlated to increased sedation requirement night.  Interim History / Subjective:  Received hyperkalemia protocol overnight Goals of care discussions were ongoing yesterday and family planned for further discussions last night  Objective   Blood  pressure 130/71, pulse 72, temperature 98.6 F (37 C), temperature source Oral, resp. rate (!) 24, height 5' 5.98" (1.676 m), weight 121.6 kg, SpO2 100%.    Vent Mode: PRVC FiO2 (%):  [40 %-50 %] 40 % Set Rate:  [24 bmp] 24 bmp Vt Set:  [460 mL] 460 mL PEEP:  [5 cmH20-10 cmH20] 10 cmH20 Plateau Pressure:  [25 cmH20-28 cmH20] 28 cmH20   Intake/Output Summary (Last 24 hours) at 09/21/23 0902 Last data filed at 09-21-23 0700 Gross per 24 hour  Intake 1704.11 ml  Output 400 ml  Net 1304.11 ml   Filed Weights   08/22/23 0427 08/23/23 0500 September 21, 2023 0500  Weight: 118.5 kg 119.3 kg 121.6 kg   Examination: General: Crtitically ill-appearing morbidly obese female, orally intubated HEENT: [place]/AT, eyes icteric.  ETT and OGT in place Neuro: Non eye opening, not following commands, spontaneous movement on the right  Chest: Decreased lung sounds, synchronous with ventilator.  Heart: Regular rate and rhythm, no murmurs or gallops Abdomen: Soft, nondistended, bowel sounds present Skin: No rash  Labs and images reviewed  Resolved Hospital Problem list     Assessment & Plan:  Acute right MCA stroke status post mechanical thrombectomy Complicated with postprocedure subarachnoid hemorrhage with IVH and right parietal for intraparenchymal hemorrhage Prior right MCA stroke with residual left-sided weakness Stroke team is following Statin Hold antiplatelet and anticoagulation in the setting of subarachnoid intraparenchymal hemorrhage Maintain SBP <160  Acute respiratory failure with hypoxia and hypercapnia Aspiration pneumonia COPD, not in exacerbation Tobacco dependence Continue lung protective ventilation Continue IV Unasyn Neurological exam excludes ability to wean from ventilator.   Chronic HFpEF Echo 9/27 EF 60 to 65% with no WMA and grade 2 diastolic dysfunction.  Paroxysmal SVT Patient is having short runs  of SVT with heart rate in 120s-140 Treating hyperkalemia, see  below  Diabetes type 2 with hyperglycemia Hemoglobin A1c 7.5 Patient blood sugars are not well-controlled Semglee twice daily, TF coverage and SSI q 4 hours Continue sliding scale insulin with CBG goal 140-180  Anemia of critical illness Monitor H&H and transfuse if less than 7  Acute on CKD stage 3a Hyperkalemia Hypervolemic hyponatremia Acute metabolic acidosis Minimal UOP with lasix challenge yesterday Nephrology is following We are not pursuing dialysis based on goals of care discussions Bicarb infusion with intermittent calcium. Could consider ongoing lokelma however family is consider transitioning to comfort directed care today.   Morbid obesity Dose adjust meds as needed  Best Practice (right click and "Reselect all SmartList Selections" daily)   Diet/type: NPO DVT prophylaxis: SCD, heparin subcutaneous  GI prophylaxis: PPI Lines: N/A Foley:  Yes, and it is still needed Code Status:  DNR Last date of multidisciplinary goals of care discussion: Per primary team  The patient is critically ill due to acute respiratory failure with hypoxia and hypercapnia/aspiration pneumonia/acute right MCA stroke.  Critical care was necessary to treat or prevent imminent or life-threatening deterioration.  Critical care was time spent personally by me on the following activities: development of treatment plan with patient and/or surrogate as well as nursing, discussions with consultants, evaluation of patient's response to treatment, examination of patient, obtaining history from patient or surrogate, ordering and performing treatments and interventions, ordering and review of laboratory studies, ordering and review of radiographic studies, pulse oximetry, re-evaluation of patient's condition and participation in multidisciplinary rounds.   During this encounter critical care time was devoted to patient care services described in this note for 36 minutes.    Earney Hamburg, ACNP [facility]  Pulmonary Critical Care Haiku chat If no response to pager, please call [phone] until 7pm After 7pm, Please call E-link [phone]

## 2023-09-23 NOTE — Death Summary Note (Signed)
Patient ID: ABIMBOLA AKI MRN: [id number] DOB/AGE: 04/05/1961 62 y.o.  Admit date: September 03, 2023 Death date: September 08, 2023  Admission Diagnoses: Stroke: right MCA large and left MCA/ACA punctate infarcts with right M1 occlusion s/p IR with TICI3, complicated with large hemorrhagic conversion, etiology: Large vessel occlusion vs. Cardioembolic source   Cause of Death: Large right MCA stroke  Pertinent Medical Diagnosis: Principal Problem:   Acute ischemic right MCA stroke (HCC) Active Problems:   Middle cerebral artery embolism, right   Aspiration into airway   Hospital Course: Patient with a history of smoking, COPD, DVT, diabetes, kidney stones and previous right MCA stroke originally presented to the hospital with sudden onset left hemiplegia, right gaze and left-sided neglect.  She was found to have a right MCA M1 occlusion.  She was taken to IR for mechanical thrombectomy, which was successful.  However, she developed aspiration pneumonia after the procedure and remained intubated.  She was treated for the aspiration pneumonia but remained ventilator dependent.  MRI on 9/30 demonstrated hemorrhagic transformation of the stroke.  Although sedation was able to be discontinued, patient's neurological exam remained poor, and patient began to develop renal failure.  Discussions were held with patient's family regarding her condition and prognosis and decision was made on September 08, 2023 for compassionate extubation to comfort care per patient's previously stated wishes.  Patient then died at 36 on Sep 08, 2023.  Signed: Marjorie Smolder 09-08-2023, 6:09 PM

## 2023-09-23 NOTE — Progress Notes (Signed)
[facility] Progress Note  Name: Jill Barnes MRN: [id number] DOB: 1961/09/08  Chief Complaint:  Sudden-onset weakness  Subjective:  She had 85 mL uop over 10/1 charted.  Her family is meeting with palliative care today.  Her husband has stated that she wouldn't have wanted dialysis and they have elected not to pursue dialysis.  Per nursing they may be transitioning to comfort measures.  She has been on bicarb gtt   Review of systems:  Unable to obtain secondary to mechanical ventilation  ----------------- Background on consult:  POOJA KRILL is a 62 y.o. female with a history of tobacco abuse, COPD, DM, nephrolithiasis, and prior right MCA stroke with residual mild left-sided weakness who presented with sudden-onset left hemiplegia.  Presented as a code stroke.  She was noted to be outside of the TNK window but was taken to IR for mechanical thrombectomy.  She has been intubated and per charting there is concern for aspiration.  Nephrology is called for AKI.  Cr 2.05 on 9/29 and now 4.16 on 9/30.  She got lasix 40 mg IV this AM and has gotten a couple of intermittent doses recently.  Got LR on 9/27.  She was on levophed on 9/27 and 9/28 per MAR history.  She had 165 mL uop over 9/29 charted.  She did get IV contrast on 9/27.      Intake/Output Summary (Last 24 hours) at 2023-08-29 1312 Last data filed at Aug 29, 2023 0700 Gross per 24 hour  Intake 1553.96 ml  Output 380 ml  Net 1173.96 ml    Vitals:  Vitals:   08-29-23 0700 2023/08/29 0800 29-Aug-2023 1100 Aug 29, 2023 1200  BP: 130/71 (!) 119/58 (!) 111/55 (!) 127/58  Pulse: 72 70 71 73  Resp: (!) 24 (!) 24 (!) 24 18  Temp:  98.6 F (37 C)  98.5 F (36.9 C)  TempSrc:  Oral  Axillary  SpO2: 100% 100% 99% 96%  Weight:      Height:         Physical Exam:   General:  adult female in bed critically ill   HEENT: NCAT Eyes: eyes closed  Neck: supple trachea midline Heart: S1S2 no rub Lungs: coarse mechanical  breath sounds; FIO2 40 and PEEP 6 Abdomen: soft/obese habitus/distended; nontender with limits of exam on sedation Extremities: 1+ edema lower extremities  Neuro: sedation is currently running  Medications reviewed   Labs:     Latest Ref Rng & Units August 29, 2023    4:26 AM 08/23/2023    6:05 PM 08/23/2023    7:06 AM  BMP  Glucose 70 - 99 mg/dL 952   841   BUN 8 - 23 mg/dL 90   69   Creatinine 3.24 - 1.00 mg/dL 4.01   0.27   Sodium 253 - 145 mmol/L 139   134   Potassium 3.5 - 5.1 mmol/L 5.6  5.9  6.1    6.2   Chloride 98 - 111 mmol/L 102   104   CO2 22 - 32 mmol/L 18   16   Calcium 8.9 - 10.3 mg/dL 8.1   7.5      Assessment/Plan:   # AKI  - secondary to ischemic ATN in the setting of hypotension and pressor requirement - Worsening unfortunately and oligoanuric  - She is now DNR and palliative has been consulted.  Her husband shares that she wouldn't have wanted to "be kept alive by machines" and that the family is in agreement.  We  will continue with conservative kidney management in keeping with the patient's wishes.  They are meeting with palliative care today about her overall goals of care - appreciate palliative and all teams involved in her care  # Hyperkalemia - s/p lokelma; on insulin therapy - transitioned to nepro for feeds     # Acute right MCA stroke  - s/p thrombectomy with IR  - Per Neurology  - Blood pressure management per neurology and primary team in the setting of acute stroke    # HTN  - Blood pressure management per neurology and primary team in the setting of acute stroke    # Aspiration PNA - abx per primary team    # Acute hypoxic respiratory failure  - Vent per critical care   # Macrocytic anemia  - no ESA in setting of acute stroke  - iron, B12, and folic acid were all a bit low - can start a supplement for these if aligns with goals of care    Disposition - in ICU.  Note that goals of care are in flux at this time and family is meeting with  palliative shortly.  Appreciate nursing staff support of her family at this difficult time   Estanislado Emms, MD 09/22/2023 1:27 PM

## 2023-09-23 NOTE — Procedures (Signed)
Extubation Procedure Note  Patient Details:   Name: Jill Barnes DOB: October 15, 1961 MRN: [id number]   Airway Documentation:    Vent end date: 17-Sep-2023 Vent end time: 1508   Evaluation  O2 sats: currently acceptable Complications: No apparent complications Patient did tolerate procedure well. Bilateral Breath Sounds: Clear, Diminished   No  One way extubation.  Vineet Kinney Sep 17, 2023, 3:09 PM

## 2023-09-23 DEATH — deceased
